# Patient Record
Sex: Male | Born: 1950 | Race: Black or African American | Hispanic: No | Marital: Single | Smoking: Former smoker
Health system: Southern US, Community
[De-identification: ages and names within clinical notes are randomized; demographics above are authoritative.]

## PROBLEM LIST (undated history)

## (undated) DIAGNOSIS — E785 Hyperlipidemia, unspecified: Secondary | ICD-10-CM

## (undated) DIAGNOSIS — K219 Gastro-esophageal reflux disease without esophagitis: Secondary | ICD-10-CM

## (undated) DIAGNOSIS — K859 Acute pancreatitis without necrosis or infection, unspecified: Secondary | ICD-10-CM

## (undated) DIAGNOSIS — I639 Cerebral infarction, unspecified: Secondary | ICD-10-CM

## (undated) DIAGNOSIS — N183 Chronic kidney disease, stage 3 unspecified: Secondary | ICD-10-CM

## (undated) DIAGNOSIS — K861 Other chronic pancreatitis: Secondary | ICD-10-CM

## (undated) DIAGNOSIS — K635 Polyp of colon: Secondary | ICD-10-CM

## (undated) DIAGNOSIS — C189 Malignant neoplasm of colon, unspecified: Secondary | ICD-10-CM

## (undated) DIAGNOSIS — E039 Hypothyroidism, unspecified: Secondary | ICD-10-CM

## (undated) DIAGNOSIS — E559 Vitamin D deficiency, unspecified: Secondary | ICD-10-CM

## (undated) DIAGNOSIS — I81 Portal vein thrombosis: Secondary | ICD-10-CM

## (undated) DIAGNOSIS — R7303 Prediabetes: Secondary | ICD-10-CM

## (undated) DIAGNOSIS — I10 Essential (primary) hypertension: Secondary | ICD-10-CM

## (undated) HISTORY — DX: Vitamin D deficiency, unspecified: E55.9

## (undated) HISTORY — DX: Gastro-esophageal reflux disease without esophagitis: K21.9

## (undated) HISTORY — DX: Hypothyroidism, unspecified: E03.9

## (undated) HISTORY — DX: Other chronic pancreatitis: K86.1

## (undated) HISTORY — PX: NECK SURGERY: SHX720

## (undated) HISTORY — PX: THYROID SURGERY: SHX805

## (undated) HISTORY — PX: THYROIDECTOMY: SHX17

## (undated) HISTORY — DX: Portal vein thrombosis: I81

## (undated) HISTORY — DX: Chronic kidney disease, stage 3 unspecified: N18.30

## (undated) HISTORY — PX: CHOLECYSTECTOMY: SHX55

## (undated) HISTORY — DX: Prediabetes: R73.03

---

## 1998-09-29 ENCOUNTER — Observation Stay (HOSPITAL_COMMUNITY): Admission: EM | Admit: 1998-09-29 | Discharge: 1998-10-01 | Payer: Self-pay | Admitting: *Deleted

## 1998-09-29 ENCOUNTER — Encounter: Payer: Self-pay | Admitting: *Deleted

## 1998-09-29 ENCOUNTER — Encounter: Payer: Self-pay | Admitting: General Surgery

## 1999-10-28 ENCOUNTER — Encounter: Payer: Self-pay | Admitting: Neurosurgery

## 1999-10-28 ENCOUNTER — Observation Stay (HOSPITAL_COMMUNITY): Admission: RE | Admit: 1999-10-28 | Discharge: 1999-10-29 | Payer: Self-pay | Admitting: Neurosurgery

## 1999-11-20 ENCOUNTER — Encounter: Payer: Self-pay | Admitting: Neurosurgery

## 1999-11-20 ENCOUNTER — Ambulatory Visit (HOSPITAL_COMMUNITY): Admission: RE | Admit: 1999-11-20 | Discharge: 1999-11-21 | Payer: Self-pay | Admitting: Neurosurgery

## 2000-01-29 ENCOUNTER — Encounter: Payer: Self-pay | Admitting: Neurosurgery

## 2000-01-29 ENCOUNTER — Ambulatory Visit (HOSPITAL_COMMUNITY): Admission: RE | Admit: 2000-01-29 | Discharge: 2000-01-29 | Payer: Self-pay | Admitting: Neurosurgery

## 2002-05-03 ENCOUNTER — Encounter: Payer: Self-pay | Admitting: *Deleted

## 2002-05-03 ENCOUNTER — Emergency Department (HOSPITAL_COMMUNITY): Admission: EM | Admit: 2002-05-03 | Discharge: 2002-05-03 | Payer: Self-pay | Admitting: *Deleted

## 2003-01-14 ENCOUNTER — Emergency Department (HOSPITAL_COMMUNITY): Admission: EM | Admit: 2003-01-14 | Discharge: 2003-01-14 | Payer: Self-pay | Admitting: *Deleted

## 2003-01-14 ENCOUNTER — Encounter: Payer: Self-pay | Admitting: Emergency Medicine

## 2003-01-16 ENCOUNTER — Encounter: Payer: Self-pay | Admitting: Emergency Medicine

## 2003-01-16 ENCOUNTER — Emergency Department (HOSPITAL_COMMUNITY): Admission: EM | Admit: 2003-01-16 | Discharge: 2003-01-16 | Payer: Self-pay | Admitting: Emergency Medicine

## 2003-01-20 ENCOUNTER — Inpatient Hospital Stay (HOSPITAL_COMMUNITY): Admission: EM | Admit: 2003-01-20 | Discharge: 2003-01-23 | Payer: Self-pay | Admitting: Emergency Medicine

## 2003-05-10 ENCOUNTER — Emergency Department (HOSPITAL_COMMUNITY): Admission: EM | Admit: 2003-05-10 | Discharge: 2003-05-11 | Payer: Self-pay | Admitting: Emergency Medicine

## 2003-10-16 ENCOUNTER — Encounter (HOSPITAL_COMMUNITY): Admission: RE | Admit: 2003-10-16 | Discharge: 2003-11-15 | Payer: Self-pay | Admitting: Orthopedic Surgery

## 2003-12-28 ENCOUNTER — Inpatient Hospital Stay (HOSPITAL_COMMUNITY): Admission: EM | Admit: 2003-12-28 | Discharge: 2003-12-31 | Payer: Self-pay | Admitting: Emergency Medicine

## 2004-01-04 ENCOUNTER — Ambulatory Visit (HOSPITAL_COMMUNITY): Admission: RE | Admit: 2004-01-04 | Discharge: 2004-01-04 | Payer: Self-pay | Admitting: Internal Medicine

## 2004-01-17 ENCOUNTER — Encounter (HOSPITAL_COMMUNITY): Admission: RE | Admit: 2004-01-17 | Discharge: 2004-01-18 | Payer: Self-pay | Admitting: Cardiology

## 2004-07-18 ENCOUNTER — Emergency Department (HOSPITAL_COMMUNITY): Admission: EM | Admit: 2004-07-18 | Discharge: 2004-07-19 | Payer: Self-pay | Admitting: Emergency Medicine

## 2004-07-20 ENCOUNTER — Inpatient Hospital Stay (HOSPITAL_COMMUNITY): Admission: EM | Admit: 2004-07-20 | Discharge: 2004-07-28 | Payer: Self-pay | Admitting: Emergency Medicine

## 2004-07-21 ENCOUNTER — Ambulatory Visit: Payer: Self-pay | Admitting: Internal Medicine

## 2004-08-15 ENCOUNTER — Emergency Department (HOSPITAL_COMMUNITY): Admission: EM | Admit: 2004-08-15 | Discharge: 2004-08-15 | Payer: Self-pay | Admitting: Emergency Medicine

## 2004-09-30 ENCOUNTER — Emergency Department (HOSPITAL_COMMUNITY): Admission: EM | Admit: 2004-09-30 | Discharge: 2004-09-30 | Payer: Self-pay | Admitting: Emergency Medicine

## 2004-10-10 ENCOUNTER — Inpatient Hospital Stay (HOSPITAL_COMMUNITY): Admission: EM | Admit: 2004-10-10 | Discharge: 2004-10-13 | Payer: Self-pay | Admitting: Emergency Medicine

## 2004-10-13 ENCOUNTER — Ambulatory Visit: Payer: Self-pay | Admitting: Urgent Care

## 2004-10-27 ENCOUNTER — Ambulatory Visit: Payer: Self-pay | Admitting: Internal Medicine

## 2004-10-30 ENCOUNTER — Ambulatory Visit (HOSPITAL_COMMUNITY): Admission: RE | Admit: 2004-10-30 | Discharge: 2004-10-30 | Payer: Self-pay | Admitting: Internal Medicine

## 2004-11-19 ENCOUNTER — Ambulatory Visit: Payer: Self-pay | Admitting: Internal Medicine

## 2005-02-06 ENCOUNTER — Ambulatory Visit: Payer: Self-pay | Admitting: Nurse Practitioner

## 2005-02-13 ENCOUNTER — Ambulatory Visit: Payer: Self-pay | Admitting: Nurse Practitioner

## 2005-02-17 ENCOUNTER — Ambulatory Visit: Payer: Self-pay | Admitting: *Deleted

## 2005-05-12 ENCOUNTER — Emergency Department (HOSPITAL_COMMUNITY): Admission: EM | Admit: 2005-05-12 | Discharge: 2005-05-12 | Payer: Self-pay | Admitting: *Deleted

## 2005-07-01 ENCOUNTER — Ambulatory Visit (HOSPITAL_COMMUNITY): Admission: RE | Admit: 2005-07-01 | Discharge: 2005-07-01 | Payer: Self-pay | Admitting: General Surgery

## 2005-07-12 ENCOUNTER — Emergency Department (HOSPITAL_COMMUNITY): Admission: EM | Admit: 2005-07-12 | Discharge: 2005-07-12 | Payer: Self-pay | Admitting: Emergency Medicine

## 2005-07-31 ENCOUNTER — Inpatient Hospital Stay (HOSPITAL_COMMUNITY): Admission: RE | Admit: 2005-07-31 | Discharge: 2005-08-02 | Payer: Self-pay | Admitting: General Surgery

## 2005-07-31 ENCOUNTER — Encounter (INDEPENDENT_AMBULATORY_CARE_PROVIDER_SITE_OTHER): Payer: Self-pay | Admitting: General Surgery

## 2005-11-13 ENCOUNTER — Encounter (INDEPENDENT_AMBULATORY_CARE_PROVIDER_SITE_OTHER): Payer: Self-pay | Admitting: Specialist

## 2005-11-13 ENCOUNTER — Ambulatory Visit: Payer: Self-pay | Admitting: Internal Medicine

## 2005-11-13 ENCOUNTER — Ambulatory Visit (HOSPITAL_COMMUNITY): Admission: RE | Admit: 2005-11-13 | Discharge: 2005-11-13 | Payer: Self-pay | Admitting: Internal Medicine

## 2006-06-01 ENCOUNTER — Emergency Department (HOSPITAL_COMMUNITY): Admission: EM | Admit: 2006-06-01 | Discharge: 2006-06-01 | Payer: Self-pay | Admitting: Emergency Medicine

## 2006-07-02 ENCOUNTER — Emergency Department (HOSPITAL_COMMUNITY): Admission: EM | Admit: 2006-07-02 | Discharge: 2006-07-02 | Payer: Self-pay | Admitting: Emergency Medicine

## 2007-09-28 ENCOUNTER — Inpatient Hospital Stay (HOSPITAL_COMMUNITY): Admission: EM | Admit: 2007-09-28 | Discharge: 2007-10-06 | Payer: Self-pay | Admitting: Emergency Medicine

## 2007-10-05 ENCOUNTER — Ambulatory Visit: Payer: Self-pay | Admitting: Gastroenterology

## 2007-12-19 ENCOUNTER — Emergency Department (HOSPITAL_COMMUNITY): Admission: EM | Admit: 2007-12-19 | Discharge: 2007-12-19 | Payer: Self-pay | Admitting: Emergency Medicine

## 2008-01-04 ENCOUNTER — Emergency Department (HOSPITAL_COMMUNITY): Admission: EM | Admit: 2008-01-04 | Discharge: 2008-01-04 | Payer: Self-pay | Admitting: Emergency Medicine

## 2009-01-24 ENCOUNTER — Emergency Department (HOSPITAL_COMMUNITY): Admission: EM | Admit: 2009-01-24 | Discharge: 2009-01-24 | Payer: Self-pay | Admitting: Emergency Medicine

## 2010-06-28 ENCOUNTER — Encounter: Payer: Self-pay | Admitting: General Surgery

## 2010-09-13 LAB — POCT CARDIAC MARKERS
CKMB, poc: 1.4 ng/mL (ref 1.0–8.0)
Myoglobin, poc: 107 ng/mL (ref 12–200)
Troponin i, poc: <0.05 ng/mL (ref 0.00–0.09)
Troponin i, poc: <0.05 ng/mL (ref 0.00–0.09)

## 2010-09-13 LAB — DIFFERENTIAL
Basophils Relative: 1 % (ref 0–1)
Monocytes Absolute: 0.4 10*3/uL (ref 0.1–1.0)
Monocytes Relative: 8 % (ref 3–12)
Neutro Abs: 3.4 10*3/uL (ref 1.7–7.7)

## 2010-09-13 LAB — BASIC METABOLIC PANEL
CO2: 28 mEq/L (ref 19–32)
Calcium: 9.4 mg/dL (ref 8.4–10.5)
Chloride: 105 mEq/L (ref 96–112)
GFR calc Af Amer: >60 mL/min (ref >60)
Sodium: 140 mEq/L (ref 135–145)

## 2010-09-13 LAB — CBC
Hemoglobin: 14.1 g/dL (ref 13.0–17.0)
MCHC: 32.3 g/dL (ref 30.0–36.0)
RBC: 5.84 MIL/uL — ABNORMAL HIGH (ref 4.22–5.81)

## 2010-10-21 NOTE — H&P (Signed)
NAMEGEOFFERY, [name] NO.:  [id number]   MEDICAL RECORD NO.:  [id number]          PATIENT TYPE:  EMS   LOCATION:  ED                           FACILITY:  [facility]   PHYSICIAN:  Vinnie Level, MD    DATE OF BIRTH:  01/02/1951   DATE OF ADMISSION:  09/28/2007  DATE OF DISCHARGE:                              HISTORY & PHYSICAL   CHIEF COMPLAINT:  Abdominal pain.   PRIMARY CARE PHYSICIAN:  Dr. Hanley Ben at the [facility].   HISTORY OF PRESENT ILLNESS:  The patient is a 60 year old African-  American male with a history of hypertension, chronic pancreatitis, and  history of colon polyp that he reports as colon cancer who presents with  the chief complaint of abdominal pain.  He notes this pain started  Monday, approximately 7-9 out of 10 intensity, and has been increasing  in intensity to today.  He notes nausea and diarrhea associated with  this, all started Monday.  He has had some vague associated chills but  no frank fevers.  He said pain was 9 out of 10 this afternoon which  prompted him to come to the emergency department.  He reports black  stools, no frank hematochezia or blood.  He has never had any history of  hematemesis.  He does report feeling weak at time.  He notes he has been  unable to eat today, being limited by pain and nausea.  He denies any  alcohol use at present.  He notes he quit drinking approximately 12  years ago.  He has noted no recent swelling in his belly.  In the  emergency department, he had been given Reglan, Dilaudid, and Zofran  with mild relief of symptoms.  He reports he did take his medicines this  morning.   PAST MEDICAL HISTORY:  1. Colon polyp/colon cancer in 2005.  2. Hypertension.  3. Hypothyroidism.  4. Chronic pancreatitis.  5. TIA, question CVA, back in 2005 as well.   PAST SURGICAL HISTORY:  None.   MEDICATIONS:  Medications at home, all prescribed through the [place],  include:  1. Levothyroxine 0.1 mg  daily.  2. Docusate 100 mg p.o. daily.  3. Ferrous gluconate 324 mg p.o. daily.  4. Nitroglycerin p.r.n. chest pain, not been taken recently.  5. Omeprazole 20 mg p.o. daily.  6. Simvastatin 40 mg tablets one-half tablet daily (20 mg).  7. Sulindac 150 mg p.r.n. pain b.i.d.  8. Hydrochlorothiazide/lisinopril 12.5/20 p.o. daily.   ALLERGIES:  NO KNOWN DRUG ALLERGIES.  HE IS INTOLERANT TO ASPIRIN WHICH  CAUSES STOMACH UPSET.   FAMILY HISTORY:  Family history notable for hypertension and colon  cancer.  He had a brother with multiple deep venous thromboses.  Mom  with DVT and died of old age.  Dad died of head trauma.   SOCIAL HISTORY:  He denies tobacco, alcohol, or drug use, though he does  report a significant alcohol history, last drink was 12 years ago.  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   REVIEW OF SYSTEMS:  All systems were reviewed  and were negative except  as noted above in the HPI.  GENERAL/HEENT:  Generally, no recent  problems with his head or neck, no trouble swallowing, no dysphagia, no  changes in his speech, normal mental status.  NECK:  No recent  lymphadenopathy.  RESPIRATORY:  No recent changes in his breathing, no  coughing, no hemoptysis.  CARDIAC:  No chest pain.  No recent swelling  of his lower extremities.  No recent dyspnea on exertion.  ABDOMEN:  He  does not note any increased abdominal swelling.  Noted tenderness as  dictated above.  Notable nausea and diarrhea.  No bloody bowel  movements.  SKIN:  Without rash, lumps, or bumps.  MUSCULOSKELETAL:  Without frank joint pain, normal gait, no arthralgias.  GENITOURINARY:  Normal urinary output.  No recent hematuria or urinary hesitancy.   Otherwise, all systems reviewed are negative except as noted above in  the HPI.  Please see written note.   OBJECTIVE:  VITAL SIGNS:  Temperature is 98.8, blood pressure is 137-  147/86-97, pulse 93-112, respiratory rate 18-22, saturation 99-100% on  room air.   GENERAL:  He is in no acute distress, alert and oriented x4.  HEENT:  Normocephalic, atraumatic.  Wearing corrective lenses.  Oropharynx is clear.  Mucous membranes appear moist, no exudate.  Nares  patent externally.  NECK:  No frank lymphadenopathy, no noted JVD, no bruits.  CHEST:  Clear to auscultation bilaterally.  CARDIOVASCULAR:  Regular rate and rhythm.  No frank murmurs, rubs, or  gallops.  ABDOMEN:  Tender, worse in the right lower quadrant.  Normoactive bowel  sounds.  Palpable liver edge without frank guarding.  No palpable  splenomegaly.  No noted periumbilical varices.  SKIN:  No jaundice.  EXTREMITIES:  Positive sock line bilaterally.  No clubbing or cyanosis.  No noted asterixis either.  NEUROLOGIC:  Cranial nerves II-XII are intact.  MUSCULOSKELETAL:  Normal x4 extremities.   LABORATORY DATA:  Laboratory data reveals a white count of 6.3,  hemoglobin of 13.0, platelet count of 270,000.  His sodium is 141,  potassium 3.6, chloride 106, bicarbonate 28, BUN 11, creatinine 1.3,  glucose 97, total protein 10.5, albumin 3.7, AST 20, ALT 23, lipase 57.  I cannot find an alkaline phosphatase noted although I am certain this  was checked.  CT of the abdomen does reveal a right hepatic segment  portal vein thrombosis.  Differential diagnosis, per the radiologist,  includes hypercoagulable state, cirrhosis, bowel ischemia, IDD,  neoplasm.   ASSESSMENT/PLAN:  A [name] with hypertension,  hypothyroidism, and chronic pancreatitis presents with abdominal pain,  nausea, weakness, and radiologic findings consistent for portal vein  thrombus.  1. Abdominal pain:  He does apparently have a portal vein thrombus by      CT, query acute versus recent.  Given his history of abdominal      pain, I would argue for more of an acute process.  He does have      this consistent history, however, of chronic pancreatitis, but      lipase appears unremarkable, as above.   He notes the pain is      similar to this and this should remain in the differential.  Will      make him n.p.o. for this and treat with symptomatic pain      medications and antiemetics as needed.  I do suspect an underlying      diagnosis of cirrhosis based on his prior alcoholism and  palpable      liver.  Further chart review when the computer appears to be      working better of his transaminases over time would be notable.  I      do not think other differential items for causes of his      pancreatitis are worthy of working up at the present time.  With      regard to his portal vein thrombus, I do note he has a profound      family history for hypercoagulable disorder and this probably      deserves further workup.  He will likely need an outpatient EGD to      evaluate for varices as well.  I do not feel a palpable spleen tip,      but further review of his CT scan would be important.  He may      warrant an ultrasound in the morning if he is feeling better for      evaluation of portal flow by ultrasound Duplex.  I will check a CMP      in the morning.  2. Hypertension:  Continue with hydrochlorothiazide/ACE inhibitor.  3. Fluids, electrolytes, and nutrition:  N.p.o. as above with      occasional chips if he is feeling better towards the morning.      Electrolytes within normal limits.  Will continue with IV fluids at      75 mL an hour.  Record his I&O.  4. Hyperlipidemia:  Will continue with his Statin though evaluation of      further LFT may be of note.  5. Endocrine:  Will check a TSH and continue with his levothyroxine.  6. Hematologic:  His hemoglobin does appear slightly on the low side.      Will continue with his ferrous gluconate.  7. Gastrointestinal:  Will continue with his omeprazole for      prophylaxis as well as Lovenox.  8. Renal:  Will repeat a creatinine in the morning.  I am unclear if      his value of 1.33 is actually valid; if so, he may warrant  further      workup.      Vinnie Level, MD  Electronically Signed     PMB/MEDQ  D:  09/28/2007  T:  09/28/2007  Job:  [id number]

## 2010-10-21 NOTE — Discharge Summary (Signed)
NAMEBEHR, CISLO               ACCOUNT NO.:  [id number]   MEDICAL RECORD NO.:  [id number]          PATIENT TYPE:  INP   LOCATION:  1418                         [facility]ACILITY:  [facility]   PHYSICIAN:  Ladell Pier, M.D.   DATE OF BIRTH:  [birth year]   DATE OF ADMISSION:  09/28/2007  DATE OF DISCHARGE:                               DISCHARGE SUMMARY   ADDENDUM   1. As far as his chronic pancreatitis that was stable from the 29th      until present.  2. Abnormal liver function tests.  His liver function test was noted      to be abnormal the day prior to discharge. He had an acute      hepatitis panel that was normal.  They are trending down.  His      statin was stopped.  He will followup with his primary care for      further evaluation of his elevated LFTs and to monitor his      function.  3. Portal vein thrombosis:  He is on Coumadin which he will be on for      approximately 4 months.  His INR is therapeutic today.  He will be      discharged on 7.5 mg of Coumadin and he will follow up for recheck      of his INR tomorrow and then routinely with his primary care      physician.   LABS AT THE TIME OF DISCHARGE:  Bilirubin 0.1, sodium 140, potassium  4.5, chloride 107, CO2 26, glucose 92, BUN 12, creatinine 1.24, total  bili 0.3 alk phos 44, AST 81, ALT 131, total protein 6.7, albumin 3.3,  calcium 9.0. PT 25.1, INR 2.2. WBC 5.9, hemoglobin 11.9, platelets 325.  Acute hepatitis panel negative. Antithrombin 3 level 103, protein C  level is 76. Functional protein C level is 115. DRVVT level is 55.  Homocysteine level is 12.3. Cardiolipin antibody IgG less than 7, IgM  less than 7, IgA less than 7, negative for Factor V mutation. Protein S  103, total and functional 64, protein C 55, total and functional 36.  Ultrasound shows intrahepatic segmental occlusion involving anterior  right portal vein of uncertain etiology.      Ladell Pier, M.D.  Electronically Signed     NJ/MEDQ  D:  10/06/2007  T:  10/06/2007  Job:  [id number]

## 2010-10-21 NOTE — Discharge Summary (Signed)
NAMEMAKAIL, WATLING NO.:  [id number]   MEDICAL RECORD NO.:  [id number]          PATIENT TYPE:  INP   LOCATION:  1418                         [facility]ACILITY[facility]  [facility]   PHYSICIAN:  Eduard Clos, MDDATE OF BIRTH:  02-04-1951   DATE OF ADMISSION:  09/28/2007  DATE OF DISCHARGE:  10/06/2007                               DISCHARGE SUMMARY   This is an interim discharge summary dictated on October 06, 2007, for  course until October 04, 2007.  This is a 60 year old male with known  history of chronic pancreatitis, hypothyroidism, hypertension, presented  here with complaints of abdominal pain.  The patient had a CAT scan of  abdomen and pelvis done which did not show any features of acute  pancreatitis, but did show portal vein thrombosis.  The patient admitted  to the medical floor.  The patient's lipase was within normal limits.  The patient was initially placed on NPO.  The patient had a  gastroenterology consult done and as per gastroenterology the patient  underwent ultrasound of the abdomen which showed portal vein thrombosis  which was confirmed and this patient was started on anticoagulation full  dose Lovenox with Coumadin.  As the patient's abdominal pain has  subsided and has had good appetite, the patient was started on clear  liquids which was advanced.  At this time the patient's coagulation  workup was still pending and the patient's LFTs started increasing for  which Simvastatin was stopped and LFTs are to be followed.  Acute  hepatitis panel was also ordered.  As per gastroenterologist, the  patient is to be at least for four months on Coumadin and following  which a repeat sonogram of the abdomen is to be done to evaluate for his  portal vein thrombosis.  His portal vein thrombosis was most likely  secondary to his chronic pancreatitis.   DIAGNOSIS:  1. 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  2. Hypothyroidism.  3. Hypertension.  4. Abnormal liver function tests.   PLAN:  The patient will need a repeat ultrasound in four months once his  Coumadin has been stopped.  The patient is to be on Coumadin with  frequent checks of his INR with his primary care physician for his  portal vein thrombosis and his coagulation workup is still pending which  is to be followed.   DISCHARGE MEDICATIONS:  Decided at the time of discharge.      Eduard Clos, MD  Electronically Signed     ANK/MEDQ  D:  10/06/2007  T:  10/06/2007  Job:  [id number]

## 2010-10-24 NOTE — Op Note (Signed)
NAME:  [name], [name]               ACCOUNT NO.:  [id number]   MEDICAL RECORD NO.:  [id number]          PATIENT TYPE:  AMB   LOCATION:  DAY                           FACILITY:  [facility]   PHYSICIAN:  R. Roetta Sessions, M.D. DATE OF BIRTH:  [birth year]   DATE OF PROCEDURE:  11/13/2005  DATE OF DISCHARGE:                                 OPERATIVE REPORT   PROCEDURE:  Colonoscopy with biopsy.   INDICATIONS FOR PROCEDURE:  Patient is a 60 year old gentleman who underwent  colonoscopy last year with removal of large colonic adenoma.  He is not  having any large GI symptoms currently.  He is here for surveillance.  This  approach has been discussed with the patient at length.  Potential risks,  benefits and alternatives have been reviewed and questions answered.  He is  agreeable.  Please see documentation on the medical record.   PROCEDURE NOTE:  O2 saturation, blood pressure, pulses, and respirations  were monitored throughout the entire procedure.  Conscious sedation with  Versed 4 mg IV, Demerol 75 mg IV in divided doses.   INSTRUMENT:  Olympus video chip system.   FINDINGS:  Digital rectal exam revealed no abnormalities.   ENDOSCOPIC FINDINGS:  Prep was good.   RECTAL:  Examination of the rectal mucosa, including retroflexion of the  anal verge, revealed no abnormalities.   COLON:  The colonic mucosa was surveyed from the rectosigmoid junction  through the left transverse, right colon, the appendiceal orifice, the  ileocecal valve, and cecum.  These structures were well seen and  photographed for the record.  From this level, the scope was slowly  withdrawn.  All previously mentioned mucosal surfaces were again seen.  The  patient had a diminutive 4 mm polyp at the base of the cecum.  The remainder  of the colonic mucosa appeared normal.  The polyp was cold-biopsied/removed.  The patient tolerated the procedure well and was reactive.   ENDOSCOPY IMPRESSION:  1.  Normal rectum.  2.   Diminutive cecal polyp, otherwise normal colon, status post cold      biopsy/removal of polyp, as mentioned above.   RECOMMENDATIONS:  1.  Follow up on path.  2.  Further recommendations to follow.      [name], M.D.  Electronically Signed     RMR/MEDQ  D:  11/13/2005  T:  11/13/2005  Job:  [id number]   cc:   Dr. Katrinka Blazing

## 2010-10-24 NOTE — Group Therapy Note (Signed)
NAME:  [name], BOLLARD               ACCOUNT NO.:  [id number]   MEDICAL RECORD NO.:  [id number]          PATIENT TYPE:  INP   LOCATION:  A335                          FACILITY:  [facility]   PHYSICIAN:  Margaretmary Dys, M.D.DATE OF BIRTH:  1950/11/14   DATE OF PROCEDURE:  07/27/2004  DATE OF DISCHARGE:                                   PROGRESS NOTE   SUBJECTIVE:  The patient has no complaints.  Doing well.  Awaiting his  biopsy report.  He denies any nausea, vomiting or abdominal pain.  He does  say he has occasional diarrhea.  No fever, chills or rigors.  No headache,  dizziness or lightheadedness.  No chest pain or shortness of breath.   OBJECTIVE:  GENERAL APPEARANCE:  Conscious, alert and comfortable, not in  acute distress.  VITAL SIGNS:  Blood pressure 109/65, pulse 80, respiratory rate 20,  temperature 97.8.  HEENT:  Normocephalic, atraumatic.  Oral mucosa is moist with no exudates.  NECK:  Supple.  No JVD.  No lymphadenopathy.  LUNGS:  Clear clinically with good air entry bilaterally.  HEART:  S1, S2 regular.  No S3, S4, gallops or rubs.  ABDOMEN:  Soft and nontender.  Bowel sounds positive.  No mass palpable.  EXTREMITIES:  No pitting pedal edema.  No calf induration or tenderness was  noted.   LABORATORY DATA:  White blood cell count 4.2, hemoglobin 11.2, hematocrit  32.3, platelet count 276 with no significant left shift.  It is noted that  the patient does have eosinophils 10% in his differential.  Sodium is 132,  potassium 3.8, chloride 103, CO2 25, glucose 109, BUN 2, creatinine 1.3,  calcium 8.1.   ASSESSMENT/PLAN:  The patient was admitted with acute abdominal pain and was  found to have a mass on CT of the abdomen.  The patient is going to be  worked up for the mass.  Had a colonoscopy with polypectomy on July 25, 2004.  Results of his biopsies should be available by the middle of this  week.  We will continue current plan with hydration and pain control for  now.      AM/MEDQ  D:  07/27/2004  T:  07/27/2004  Job:  [id number]

## 2010-10-24 NOTE — Discharge Summary (Signed)
NAMELAVIN, PETTEWAY               ACCOUNT NO.:  [id number]   MEDICAL RECORD NO.:  [id number]          PATIENT TYPE:  INP   LOCATION:  A335                          FACILITY:  [facility]   PHYSICIAN:  Osvaldo Shipper, MD     DATE OF BIRTH:  12-Oct-1950   DATE OF ADMISSION:  07/20/2004  DATE OF DISCHARGE:  02/20/2006LH                                 DISCHARGE SUMMARY   PRIMARY CARE PHYSICIAN:  Dr. Elpidio Anis.   DISCHARGE DIAGNOSES:  1.  Carcinoma in situ of the colon.  2.  Hypothyroidism.  3.  Pedunculated polyps of the left colon.   HISTORY AND PHYSICAL:  Please review the history and physical dictated on  July 20, 2004 for details regarding this patient's presenting illness.   HOSPITAL COURSE:  The patient is a [name] who has  had multiple admissions in the past for probable pancreatitis.  He presented  to the hospital this time with nausea, vomiting and diffuse abdominal pain  with diarrhea and some crampy abdominal pain.  The patient was admitted with  a diagnosis of acute viral gastroenteritis.  The diagnosis of pancreatitis  was in doubt.  Since his symptoms were not resolving, the patient underwent  CAT scan of his abdomen which showed an ileocolic intussusception in the  ascending colon with what appeared to be a large soft tissue mass which was  leading the intussusception, measuring about 3.6 x 3.5 cm.  Subsequent to  this CAT scan, gastroenterology service was consulted, and the patient  underwent a colonoscopy.  Initial attempt of colonoscopy revealed  pedunculated, villous-appearing mass in the mid-right colon.  A 6 mm  pedunculated polyp was also seen at the ileocecal valve.  These masses were  removed.  However, the patient developed a vagal reaction and the procedure  had to be terminated prematurely.  The patient underwent a repeat  colonoscopy the following day in which there were some pedunculated polyps  in the left colon which were  removed with a snare, and the additional  residual polypoid mass in the right colon was also removed by snare  polypectomy.  All the masses and tissue was submitted to pathology.  Dr.  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  There was no evidence for any invasive  cancer seen.  Based on this report, it was thought that the patient should  have a repeat colonoscopy in about 3 months.  We will draw a CEA level on  this patient prior to his discharge.  The patient needs not use any NSAIDS  or aspirin for about 10 days from today.  The final pathology report is not  available yet, and the above mentioned is based on Dr. Luvenia Starch conversation  with the pathologist.   PROCEDURES PERFORMED:  Colonoscopy as described above.   CONDITION ON DISCHARGE:  Condition of the patient on discharge is stable.   ACTIVITY:  May resume previous level of activity.   DIET:  The patient may  eat a low-salt diet.   DISCHARGE MEDICATIONS:  1.  Reglan 10 mg p.o. every 6 hours as needed for residual nausea that the      patient still has.  2.  Lortab 7.5/500, 1 tablet every 6 hours as needed for residual pain that      the patient has.   FOLLOWUP:  The patient needs to see his primary doctor, Dr. Elpidio Anis, in  a week to 10 days from today.  He needs to see Dr. Jena Gauss in his office, in 2-  3 months at which point an additional repeat colonoscopy will be performed.  The patient will be given the telephone numbers for these 2 physicians, and  he will be calling him to confirm these appointments.      GK/MEDQ  D:  07/28/2004  T:  07/28/2004  Job:  [id number]   cc:   Dirk Dress. Katrinka Blazing, M.D.  [address]  [place]  [place] [address]  Fax: [phone]   R. Roetta Sessions, M.D.  [address]  [place]  [place] [address]

## 2010-10-24 NOTE — Consult Note (Signed)
NAME:  Guy, Farrell                         ACCOUNT NO.:  [id number]   MEDICAL RECORD NO.:  [id number]                   PATIENT TYPE:  INP   LOCATION:  A217                                 FACILITY:  [facility]   PHYSICIAN:  Kofi A. Gerilyn Pilgrim, M.D.              DATE OF BIRTH:  [birth year]   DATE OF CONSULTATION:  DATE OF DISCHARGE:                                   CONSULTATION   REASON FOR CONSULTATION:  Left-sided weakness.   IMPRESSION:  The symptoms and the clinical picture seem most consistent with  psychosomatic/conversion disorder.  The patient does appear to have a  thyroid mass which may also contribute to the overall medical illness and  fatigue.  However, the thyroid disease if present is unlikely to explain  focal neurological deficit, unless of course he has a thyroid myopathy.  The  recommendations, no further neurological recommendation is suggested at this  time.  The thyroid function tests are to be followed up, and appropriate  treatment rendered.  Suspect the patient's neurological deficits should  improve within a couple of weeks or so.  If the patient's condition does not  improve, a reconsultation is suggested.  Depending on the patient's clinical  progression, further testing may include acetylcholine receptor antibody  testing to evaluate for the possibility of myasthenia gravis, although this  seems unlikely with the sensory symptoms and preserved proximal muscle  strength, particularly the preserved  orbicularis oculi muscles.  Additionally, cervical spine MRI may also be considered, although again his  examination suggested a cervical myelopathy is unlikely given the  description of impaired sensation on the left, and also weakness on the left  side.  Additionally, he also reports facial numbness on the left.   HISTORY:  A 60 year old man who apparently presented with acute onset of  left-sided numbness and weakness.  He apparently went to bed and woke up  with these symptoms.  He also complains of neck ache and headache associated  with these symptoms.  He was taken the emergency room for further evaluation  where images of the brain does not show any acute process.  The patient's  symptoms have persisted.  He still reports weakness and numbness on the left  side.  The patient does not report having a history of episodic headaches.   The patient denies any smothering problems.  He does report having some  slurred speech associated with his current symptoms.  He also reports  episodic diplopia on standing, apparently associated with dizziness.   SOCIAL HISTORY:  The patient psychosocial history is significant for recent  divorce approximately seven months ago.  He also lost his job approximately  one month ago.   PAST MEDICAL HISTORY:  Significant for pancreatitis thought related to a  remote history of alcohol use.  Apparently, he has not consumed alcoholic  beverages since approximately 12 years ago.  There is also past use of  tobacco and marijuana use.  The patient is status post cholecystectomy.  He  apparently has had problems with hypertension in the past.  He is status  post neck surgery four years ago.   ADMISSION MEDICATIONS:  1. Tylenol.  2. Advil.   ALLERGIES:  ASPIRIN causes GI intolerance.   FAMILY HISTORY:  Significant for hypertension and cirrhosis of the liver.   REVIEW OF SYSTEMS:  Positive for chest pain apparently on the left side  associated with palpitations and sweaty.  No GI symptoms are reported.   PHYSICAL EXAMINATION:  GENERAL:  A thin gentleman.  The patient has a flat  affect.  VITAL SIGNS:  Temperature 97.5, pulse 76, respirations 20, blood pressure  144/87.  NECK:  Supple.  ABDOMEN: Soft.  EXTREMITIES:  No edema.  NEUROLOGIC:  Mentation again showed he is awake. He converses well.  His  affect is flat.  No dysarthria or dysphagia is noted.  The cranial nerve  evaluation shows that the pupils are 4  mm and briskly active.  He does  appear to have some evidence of mild bilateral ptosis although direct  examination of the orbicularis oculi muscles are intact and shows normal.  Extraocular movements are full.  Visual fields are intact.  Facial muscle  strength is normal and symmetric.  Tongue is midline.  Uvula is midline.  He  does seem to have a significant overbite.  Shoulder shrug is normal.  Motor  examination shows generalized weakness 4/5 on both sides.  The weakness  seemed to be typical give-away weakness however.  The patient was tested for  arm abduction time.  He was able to go two minutes on the right.  On the  left side, he had initial 30 degree drop, but was able to sustain his arm up  for a full two minutes on the left side.  The patient was able to maintain  his eyes looking up for a full two minutes without ptosis on both sides.  Reflexes are slightly brisk; however, plantar reflexes are both downgoing.  Sensory examination was normal on both sides to light touch and temperature.  Coordination was unremarkable.  Gait slightly unsteady but seems fine.   LABORATORY DATA:  Laboratory evaluation is essentially unremarkable  including electrolytes.  Specifically, calcium, potassium and sodium were  normal.  Magnesium was also normal.  A spinal fluid analysis was obtained.  WBC is apparently 0 on tubes 1 and tube 4.  On tube 1, the rbc's are 2910,  and on tube 4 are 20, glucose 63, and protein 52.  CPK 119.  Sed rate of 4.  Initial CT scan of the brain is unremarkable.  MRI of the brain is also  reviewed and shows small ischemic white matter lesions.  The diffusion  imaging does not show any acute lesions.  There is apparently the  possibility of a small aneurysm located in the artery on the right  approximately 2.2 mm to 0.5 mm.      ___________________________________________                                            Darleen Crocker A. Gerilyn Pilgrim, M.D.  KAD/MEDQ  D:  12/28/2003   T:  12/29/2003  Job:  [id number]

## 2010-10-24 NOTE — Discharge Summary (Signed)
NAME:  [name], [name]                         ACCOUNT NO.:  [id number]   MEDICAL RECORD NO.:  [id number]                   PATIENT TYPE:  INP   LOCATION:  A217                                 FACILITY:  [facility]   PHYSICIAN:  Vania Rea, M.D.              DATE OF BIRTH:  01-01-1951   DATE OF ADMISSION:  12/28/2003  DATE OF DISCHARGE:  12/31/2003                                 DISCHARGE SUMMARY   PRIMARY CARE PHYSICIAN:  Annia Friendly. Loleta Chance, M.D.   NEUROLOGIST:  Kofi A. Gerilyn Pilgrim, M.D.   DISCHARGE DIAGNOSES:  1. Acute left-sided weakness, much improved.  2. 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  3. Chest pain, myocardial infarction ruled out.  4. Thyroid mass.  5. Microcytosis of unclear etiology.  6. Depression.   DISPOSITION:  Discharged to home.   CONDITION ON DISCHARGE:  Stable.   DISCHARGE MEDICATIONS:  1. Prozac 20 mg daily.  2. Plavix 75 mg daily.  3. Protonix 40 mg daily.   HISTORY OF PRESENT ILLNESS:  Please refer to the history and physical of  December 28, 2003.  This is a 60 year old African-American man who for the past  month noted episodes of sudden decrease in his exercise tolerance, but had  not sought help for this. The patient is currently not seeing his primary  care physician because of financial issues.  On the day of admission he  described a sudden weakness and numbness of the left side of his body, and  difficulty walking.  He was brought to the emergency room and evaluation  included a CT scan of his chest and an MRI and MRA of the brain.  The MRI  and MRA revealed no evidence of acute abnormalities.  A CT scan revealed a  3.0 cm thyroid mass.  The patient on admission was felt to be having  hysteria/conversion disorder, but was never-the-less admitted for a  cardiology and neurology evaluation.   HOSPITAL COURSE:  The patient had the benefit of evaluation by Dr. Gerilyn Pilgrim  who felt that there was no acute neurological problem, but recommended  reconsultation  if symptoms did not clear up within a few weeks.  The  cardiologist felt that he was likely having no acute cardiology problem, and  could probably be worked up with an outpatient stress test.  Biopsy of the  thyroid could not be performed over the weekend when the patient was in the  hospital.   FOLLOWUP:  1. The patient will be followed up by Dr. Loleta Chance, as his new primary care     Andersyn Fragoso.  2. The patient is to have a biopsy of his thyroid mass on Friday, January 04, 2004, at 11 a.m.  Registration at 10:50 a.m.  3. The patient has been given an outpatient appointment for a Cardiolite     stress test by the cardiologist.   DISCHARGE LABORATORY DATA:  The patient's hematocrit today  36, white count  4.5, MCV 71, RDW 14.2, platelet count 320.  His serum chemistries are normal  with a creatinine of 1.2.  His cardiac enzymes over 24 hours were normal.  His free T4 is 1.2, TSH 1.309, both well within the normal range.  His iron  studies also showed no evidence of iron deficiency anemia.  Serum iron 76,  ferritin 93, total iron binding capacity 337.  His B12 and folate levels  were normal.  His homocysteine levels were normal.  Of most significance his  ESR was only 4.  His RPR was nonreactive.     ___________________________________________                                         Vania Rea, M.D.   LC/MEDQ  D:  12/31/2003  T:  12/31/2003  Job:  [id number]   cc:   Annia Friendly. Loleta Chance, M.D.  [address]  [place]  [place] [address]  Fax: [phone]   Kofi A. Gerilyn Pilgrim, M.D.  [address] [place]  [place]  [place] [address]  Fax: [phone]   [facility]

## 2010-10-24 NOTE — Discharge Summary (Signed)
[name], [name] NO.:  [id number]   MEDICAL RECORD NO.:  [id number]                  PATIENT TYPE:   LOCATION:                                       FACILITY:   PHYSICIAN:  Donna Bernard, M.D.             DATE OF BIRTH:   DATE OF ADMISSION:  DATE OF DISCHARGE:  01/23/2003                                 DISCHARGE SUMMARY   FINAL DIAGNOSES:  1. Acute pancreatitis.  2. Mild anemia 12.4 hemoglobin with slight microcytosis with mean cell     volume of 72.   FINAL DISPOSITION:  1. Patient discharged to home.  2. Discharge medications:  Vicodin one every four to six hours as needed for     pain.  3. See in the office on January 30, 2003 at 10 to followup for pancreatitis     and to further discuss an approach to chronic health concerns.   INITIAL HISTORY AND PHYSICAL:  Please see H&P as dictated.   HOSPITAL COURSE:  This patient is a 60 year old black male with a history of  prior bouts of pancreatitis who presented to the emergency room, day of  admission, with complaints of severe epigastric pain.  A scan was performed  which ruled out appendicitis.  Blood work revealed an elevated amylase and  lipase.  CBC showed a borderline low hemoglobin at 12.8 with a microcytic  anemia.  The patient was given IV fluids, initially normal saline for a  liter and then D-5 1/2 normal saline plus 20 mEq K at 175 cc/hr.  Patient  was given Reglan IV for nausea, Protonix IV to cover any component of  gastritis.  He was also given morphine as needed for pain.  We had to  increase his morphine dose.  Repeat amylase and lipase showed improvement of  the lipase down to 37 and the amylase down to 131, the day following  admission.  Two days following admission, the amylase was still hovering at  borderline at 133, the lipase 43.  Due to the macrocytic anemia, we did a  ferritin level which was within normal limits at 97.  Indeed, a repeat CBC  showed a  hemoglobin of 13.8 with a microcytic anemia which was essentially  within normal limits other than the microcytosis.  I discussed this blood  panel with the patient and he preferred to hold off on further workup at  this time.  The patient improved over several days of IV fluids, nausea  medicine, pain medicine.  On the day of discharge the patient was feeling  better.  He was discharged home with the diagnoses and disposition as noted  above.          ___________________________________________  Donna Bernard, M.D.   Karie Chimera  D:  03/07/2003  T:  03/07/2003  Job:  [id number]

## 2010-10-24 NOTE — Procedures (Signed)
NAME:  [name], [name]                         ACCOUNT NO.:  [id number]   MEDICAL RECORD NO.:  [id number]                  PATIENT TYPE:   LOCATION:                                       FACILITY:   PHYSICIAN:  Newberg Bing, M.D.               DATE OF BIRTH:  04-20-1951   DATE OF PROCEDURE:  01/18/2004  DATE OF DISCHARGE:                                  ECHOCARDIOGRAM   REFERRING PHYSICIAN:  Dr. Katrinka Blazing.   CONSULTING PHYSICIAN:  Dr. Dietrich Pates.   CLINICAL DATA:  A 60 year old gentleman with chest pain.   M-MODE:  Aorta 3.4, left atrium 3.4, septum 1.3, posterior wall 1.0, LV  diastole 3.7, LV systole 3.3.   1. Technically adequate echocardiographic study.  2. Normal left atrium, right atrium, and right ventricle.  3. Normal aorta, mitral, tricuspid, and pulmonic valves.  4. Normal internal dimension of the left ventricle; borderline LVH; normal     regional and global LV systolic function.  5. Normal IVC.  6. Normal Doppler examination.      ___________________________________________                                            [name], M.D.   RR/MEDQ  D:  01/19/2004  T:  01/19/2004  Job:  [id number]

## 2010-10-24 NOTE — H&P (Signed)
NAME:  HAZE, ANTILLON               ACCOUNT NO.:  [id number]   MEDICAL RECORD NO.:  [id number]          PATIENT TYPE:  AMB   LOCATION:  DAY                           FACILITY:  [facility]   PHYSICIAN:  Jerolyn Shin C. Katrinka Blazing, M.D.   DATE OF BIRTH:  1950/11/22   DATE OF ADMISSION:  DATE OF DISCHARGE:  LH                                HISTORY & PHYSICAL   HISTORY OF PRESENT ILLNESS:  Mr.  Guerette is a 60 year old male with history  of an enlarging left thyroid mass.  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  The patient was placed on Synthroid for suppression.  The mass was gradually  increasing in size and almost doubled in size by October 2005.  The patient  has had more discomfort with increasing dysphagia.  It appeared to be stable  for a few months but now it is quite large and has increased at least 100%  over the past eight months. In spite of the negative biopsy, the patient is  to have resection of the left thyroid mass and also resection of the right  side based on pathology.   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 This is being followed by  gastroenterology and there is a history of pancreatitis and hypertension.   MEDICATIONS:  1.  Aspirin 81 mg daily.  2.  Lipitor 0.15 mg daily.  3.  Aciphex 20 mg daily.  4.  Lisinopril 20 mg daily.  5.  Reglan 10 mg a.c. and h.s.  6.  Aleve p.r.n.  7.  Benicar 20 mg daily.   PAST SURGICAL HISTORY:  Laparoscopic cholecystectomy and resection of stones  __________ .   SOCIAL HISTORY:  The patient is divorced, unemployed.  Does not drink, smoke  or use drugs.   PHYSICAL EXAMINATION:  VITAL SIGNS:  Blood pressure 160/98, pulse 100,  respirations 20, weight 143 pounds.  HEENT:  Unremarkable.  NECK:  Large, nontender mass of the left lobe of the thyroid encompassing  most of the left lobe with mild  tenderness of the right thyroid lobe.  No  adenopathy is felt.  CHEST:  Clear to auscultation.  HEART:  Regular rate and rhythm without murmur, gallop or rub.  ABDOMEN:  Soft, nontender.  No masses.  EXTREMITIES:  No cyanosis, clubbing or edema.  NEUROLOGIC:  No focal deficits.   IMPRESSION:  1.  Left thyroid mass with rapid enlargement.  2.  Hypertension.  3.  History of pancreatitis.  4.  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   PLAN:  The patient will have subtotal thyroidectomy.      Dirk Dress. Katrinka Blazing, M.D.  Electronically Signed     LCS/MEDQ  D:  07/30/2005  T:  07/31/2005  Job:  [id number]

## 2010-10-24 NOTE — Consult Note (Signed)
NAME:  [name], [name]               ACCOUNT NO.:  [id number]   MEDICAL RECORD NO.:  [id number]          PATIENT TYPE:  INP   LOCATION:  A335                          FACILITY:  [facility]   PHYSICIAN:  R. Roetta Sessions, M.D. DATE OF BIRTH:  1951/01/06   DATE OF CONSULTATION:  07/23/2004  DATE OF DISCHARGE:                                   CONSULTATION   REASON FOR CONSULTATION:  Nausea and vomiting, abdominal pain, and abnormal  abdominal CT.   HISTORY OF PRESENT ILLNESS:  [name] is a pleasant 60 year old gentleman  admitted to the hospital on July 20, 2004 with the above stated  symptoms.  He started having symptoms approximately five days ago.  He went  to the emergency room.  He was told he had mild pancreatitis and was sent  out.  He returned three days ago and was subsequently admitted to the  hospitalists service.  On July 22, 2004 his H&H was 13.0 and 48.8.  With  hydration this dropped down to 11.0 and 34.9.  His MCV is low at 71.3.  Amylase has been mildly elevated at 152 and 139 respectively.  Lipase has  been normal, as has his LFTs.   [name] reports intermittent nausea and vomiting, more diffuse to right-  sided abdominal pain, more or less for the past week.  This morning he had  some dark blood in his bowel movement.  He has had some watery diarrhea  prior to today's episode.   A CT of the abdomen and pelvis yesterday demonstrates a large mass in the  right colon/cecum, bleeding, and intussusception in this same area.  His  pancreas looks good.  He also had a complex cyst in his right kidney.   [name] reports having a colonoscopy here possibly by Dr. Katrinka Blazing some five-  six years ago (documentation of this procedure is not available at this  time).  There is no family history for colorectal neoplasia.   [name] tells me he previously was followed by Dr. Gerda Diss but now is  followed primarily by Dr. Elpidio Anis.  Apparently he has some type of  thyroid problem, for which he is being followed.   PAST MEDICAL HISTORY:  1.  The patient does have a history of pancreatitis felt to be related to      distant alcohol use.  None in the past eight years.  2.  He was seen last year by neurology and cardiology for left-sided      numbness and weakness and a cardiology evaluation.  The neurological      evaluation was unrevealing for any significant disease process.  3.  He is status post cholecystectomy several years ago.  4.  Status post neck surgery for disk disease.  5.  History of hypertension.  6.  No history of diabetes or sickle cell disease.   MEDICATIONS ON ADMISSION:  Advil/aspirin p.r.n.   ALLERGIES:  No known drug allergies.   SOCIAL HISTORY:  The patient is divorced.  No alcohol in eight years.  He  does not drink or use tobacco.  FAMILY HISTORY:  Father died of cirrhosis in his 96s.  He was an alcoholic  by report.  Otherwise no history of chronic GI or liver illness, although  his mother is reported to have stomach problems.   REVIEW OF SYSTEMS:  No recent chest pain, dyspnea.  No fever or chills.   PHYSICAL EXAMINATION:  GENERAL:  This was a pleasant 60 year old gentleman  resting comfortably.  He currently rates his abdominal pain 2/10.  VITAL SIGNS:  He did have a temperature of 100.1 last evening.  Today  temperature is 98.7, BP 141/81, pulse 93.  SKIN:  Warm and dry.  HEENT:  No scleral icterus.  Conjunctivae are somewhat pale.  LUNGS:  Clear to auscultation.  CARDIAC:  Regular rate and rhythm without murmurs, rubs, or gallops.  ABDOMEN:  Nondistended.  Positive bowel sounds.  Soft.  He does have some  right mid abdominal tenderness.  I do not appreciate a discreet mass on  palpation.  No obvious organomegaly.  EXTREMITIES:  No edema.   LABORATORY DATA:  From July 22, 2004 sodium 130 down from 138 on  July 20, 2004.  Potassium 4.6, chloride 107, CO2 of 17, glucose 72, BUN  9, creatinine 1.4,  calcium 8.3.  AST 24, ALT 15, ALP 57, total bilirubin  0.5, amylase 39, lipase 42 on admission.  An ultrasound done July 21, 2004 revealed a 1.2 cm complex cyst in the right kidney, for which a CT scan  was subsequently ordered.   I have reviewed the CT scan with Dr. __________ personally.  The findings  are as stated above.   IMPRESSION:  [name] is a pleasant 60 year old gentleman admitted  to the hospital with abdominal pain, some nausea and vomiting, diarrhea, and  some dark red blood/rectum this morning.  He has a microcytic anemia.  More  importantly, he has a very troublesome mass in the right colon leading in  intussusception.  I am afraid this may well turn out to be colon carcinoma.  I discussed my concerns with [name].  He needs to have his colon  surveyed via colonoscopy in the very near future.  I have discussed the  potential risks, benefits, and alternatives with [name] and his  questions were answered.  He is agreeable.  We will plan to proceed with the  colonoscopy on July 24, 2004.  We will start the prep this afternoon.   He does have a mildly elevated amylase but a normal appearing pancreas on  CT.  I do not think any of his recent gastrointestinal symptoms are coming  from his pancreas.  I do not feel the patient has had recent pancreatitis.      RMR/MEDQ  D:  07/23/2004  T:  07/23/2004  Job:  [id number]   cc:   Dirk Dress. Katrinka Blazing, M.D.  [address]  [place]  [place] [address]  Fax: [phone]   Calvert Cantor, M.D.

## 2010-10-24 NOTE — Group Therapy Note (Signed)
NAMECINSERE, MIZRAHI NO.:  [id number]   MEDICAL RECORD NO.:  [id number]          PATIENT TYPE:  INP   LOCATION:  A335                          FACILITY:  [facility]   PHYSICIAN:  Vania Rea, M.D. DATE OF BIRTH:  [birth year]   DATE OF PROCEDURE:  07/21/2004  DATE OF DISCHARGE:                                   PROGRESS NOTE   SUBJECTIVE:  Hospital day two.  The patient says he continues to have  epigastric pains relieved by morphine.  Still having a tendency for nausea  but is keeping down clear liquids.   PHYSICAL EXAMINATION:  VITAL SIGNS:  Temperature 97.8, pulse 85,  respirations 18, blood pressure 130/75.  HEENT:  Mucous membranes are dry.  CHEST:  Clear to auscultation bilaterally.  CARDIOVASCULAR:  Regular rhythm.  ABDOMEN:  He has diffuse upper abdominal tenderness, no masses.  Abdominal  sounds are markedly decreased.  EXTREMITIES:  Without edema.   LABORATORY DATA:  His white count is 5.2, hemoglobin 12.5, hematocrit 37.5,  platelets 294.  His sodium is 138, potassium 4.6, chloride 106, CO2 26, BUN  10, creatinine 1.4, glucose 69.   ASSESSMENT:  1.  Acute abdominal pain.  2.  Acute and chronic pancreatitis versus gastroenteritis.   PLAN:  The picture seems more like pancreatitis at the moment with the  decreased bowel sounds.  However, presence of bacteria and white cells in  his urine make pyelonephritis also a possibility.  We will get an abdominal  ultrasound so assess for pyelonephritis.  We will continue morphine.  Will  keep him NPO for today but will monitor in and out including stool.      LC/MEDQ  D:  07/21/2004  T:  07/21/2004  Job:  [id number]

## 2010-10-24 NOTE — H&P (Signed)
NAME:  [name], [name]                         ACCOUNT NO.:  [id number]   MEDICAL RECORD NO.:  [id number]                   PATIENT TYPE:  INP   LOCATION:  A217                                 FACILITY:  [facility]   PHYSICIAN:  Vania Rea, M.D.              DATE OF BIRTH:  1950/07/16   DATE OF ADMISSION:  12/28/2003  DATE OF DISCHARGE:                                HISTORY & PHYSICAL   CHIEF COMPLAINT:  Weakness of the left side of the body since this morning.   HISTORY OF PRESENT ILLNESS:  This is a 60 year old African-American man with  a past history of pancreatitis, last attack one year ago, hypertension  apparently fluctuates, currently on no medication, who was laid off from  this work in the [employer] about 2 months ago and has been experiencing  some depression associated with this.  He was apparently in his usual state  of health until last night when he had chest pain which was relieved by  taking Advil.  The patient has been having episodic chest pain for some time  now.  The patient went to bed and woke with numbness and weakness of the  left side of his body and also a headache, left side of his head and neck.  When the patient attempted to walk he noticed he was dizzy and weak on the  left side.  These symptoms were associated with palpitations as sweating and  also blurring of his vision but not double vision and dizziness.  The  patient made his way across the hall to his neighbor in adjoining apartment  and the neighbor brought him to the emergency room in his car.   The patient denies any fever but has been having a cough and a cold the past  2 weeks for which he takes Alka Seltzer Plus and Tylenol.  He denies nausea  or vomiting.   The patient for the past one month he says has had a sudden decrease in his  exercise tolerance.  Exercise tolerance is now down to dyspnea on exertion  of 15 to 20 steps.  He has to stop to rest after going up half a flight of  stairs.  This type of exertion sometimes brings on the chest pain.  The  patient says he has had similar pains, has visited the emergency room and  was told it was heart burn and was given Prilosec or Nexium for it.   The patient currently says he is having chest pain 5/10 which tends to move  to the left side of his chest.  He was sweating and associated with the  incidence.  He has never experienced lower extremity edema.  The patient  describes symptoms of orthopnea and PND ever since his neck surgery in 2001.   PAST MEDICAL HISTORY:  Recurrent pancreatitis, last episode 1 year ago,  status post cholecystectomy years ago, status post  neck surgery about 4  years ago, hypertension episodic.  No history of diabetes, no prior history  of strokes, no history of psychiatric disorder.   MEDICATIONS:  No prescribed medications, has been taking Alka Seltzer,  Tylenol and Advil over-the-counter.   ALLERGIES:  ASPIRIN causes GI intolerance.   SOCIAL HISTORY:  He was divorced a few months ago after 7 years of marriage,  discontinued tobacco, alcohol, and marijuana use about 10 years ago.   FAMILY HISTORY:  He has no children of his own.  Other family history is  significant for hypertension among his brothers and sisters.  His father  died of liver cirrhosis in his 67s.  His mother lived 33.  She also had  hypertension and multiple GI problems.   REVIEW OF SYSTEMS:  Currently having left sided headache and does have a  history of sinusitis.  Wears glasses but does not have glaucoma or  cataracts.  No fever.  Has lost about 5 pounds in the past month.  Has had  depression associated with loss of his job.  Denies anxiety.  Cardiopulmonary system:  Chest pain as described above, 5/10 today,  radiating to the left side, associated with palpitations and sweating,  dyspnea on exertion as described in the HPI.  Genitourinary system:  No  nausea or vomiting, constipation, diarrhea or bloody stool,  no jaundice.  Past history of pancreatitis.  Denies frequency, dysuria, hematuria or  hesitance.  Musculoskeletal:  Denies any joint pains.   PHYSICAL EXAMINATION:  This is an apprehensive looking middle-aged African-  American man lying in bed.  W hat is most noticeable about him is the  fluttering of the eyelids as noted very often in people with conversion  disorders.  His vitals:  Temperature 97.4, pulse 90, blood pressure 133/84.  Noticeable when first seen in the emergency room about 6 hours ago his pulse  was 122 and his respirations were 22.  Currently he is breathing normally,  respiratory rate about 16.  His pain level is described as 5/10.  He is  saturating at 100% on room air.  HEENT:  His pupils are round, equal and reactive to light.  Mucous membrane  is slightly dehydrated.  There is no lymphadenopathy.  There is no jugular  venous distention.  CHEST:  Clear to auscultation bilaterally.  The patient has reproducible  chest pain on springing the sternum.  CARDIOVASCULAR:  He has a regular rhythm, no murmurs.  His abdomen is soft  and nontender.  There is no organomegaly appreciated.  ABDOMEN:  The patient has epigastric tenderness.  EXTREMITIES:  Without edema and his pulses are 2+ bilaterally.  CNS EXAMINATION:  Cranial nerves are grossly intact.  He seems to have  generalized weakness but it is unclear how much of this is psychological  lack of effort, but still he can be assessed as grade 5 power on the right  and grade 4-5 power on the left.  Response to pin prick testing in the leg  from knee to ankle is variable.  Sometimes he acknowledges pin pricks in the  medial side of the left leg, sometimes he does not.  There seems to be no  problem with sensation on the right side of his body or in the upper parts  of his body.  His reflexes are brisk and equal in all areas.  His plantars  are downgoing bilaterally.  ADMISSION LABORATORIES:  CBC:  White count is 4.6,  hemoglobin 11.5,  hematocrit  34.9, MCV is low at 70.  His IAW is 14.2, platelet count 231.  His serum chemistry is normal with the exception of a glucose of 120,  BUN  11, creatinine 1.5, calcium 8.9.  His INR is 1.0.  His point of care cardiac  enzymes are so far negative.  The patient has had a lumbar puncture.  It was  initially bloody but the 4th tube has only 20 RBCs, no white blood cells.  Proteins reported as 22.  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  His MRI is pending.  His EKG shows sinus tachycardia  at 110 done early in his hospital course, with a high QRS voltage but no  clear evidence of ventricular hypertrophy.   ASSESSMENT AND PLAN:  1. Middle aged man presenting with acute left-sided weakness and subjective     sensory impairment.  Differential includes an acute cardiovascular event     but this may also be hysterical in nature.  He has recently lost his wife     to divorce and lost his job and overall seems to be having some features     of hysteria.  Nevertheless, we will proceed with an MRI, MRA to rule out     a cardiovascular event or a neoplasm.  2. Thyroid mass.  It is possible that this could be a type of thyroid     related event, either from hyper or hypothyroidism although the physical     examination does not support this.  Reflexes are not hung-up and are not     particularly brisk.  However, he could have metastatic thyroid neoplasm.     Biopsy of his thyroid mass is     indicated.  3. The patient describes quite significant dyspnea on exertion and needs     cardiac evaluation.  We will get a 2D echo, we will cycle his enzymes and     arrange for a cardiology evaluation.     ___________________________________________                                         Vania Rea, M.D.   LC/MEDQ  D:  12/28/2003  T:  12/28/2003  Job:  [id number]

## 2010-10-24 NOTE — Consult Note (Signed)
NAME:  [name], [name]                         ACCOUNT NO.:  [id number]   MEDICAL RECORD NO.:  [id number]                   PATIENT TYPE:  INP   LOCATION:  A217                                 FACILITY:  [facility]   PHYSICIAN:  [place] Bing, M.D.               DATE OF BIRTH:  1950/09/13   DATE OF CONSULTATION:  12/28/2003  DATE OF DISCHARGE:                                   CONSULTATION   CONSULTING PHYSICIAN:  [place] Bing, M.D.   REFERRING PHYSICIAN:  Vania Rea, M.D.   HISTORY OF PRESENT ILLNESS:  A 60 year old gentleman without previous  cardiac history admitted for left sided neurologic symptoms including  decreased sensation and weakness associated with left sided headache.  Cardiology consultation requested for incidental chest discomfort.  Mr.  Hinners has never previously seen a cardiologist.  He has not had any  significant cardiac testing.  He presented with the acute onset of left  sided numbness and weakness early this morning associated with headache.  There was no dysphasia or dysarthria.  A MRI study has revealed no lesions  that would produce these symptoms.  Soon after he developed these problems,  he experienced lower, sharp, moderately severe, mid subscapular pain that  radiated to the left anterior chest associated with dyspnea and diaphoresis.  There was no nausea.  The duration was approximately 15 minutes.  The  patient was unaware of anything that improved the discomfort or exacerbated  it.  He has not previously had any of these symptoms.   PAST MEDICAL HISTORY:  1. Notable for an admission for pancreatitis last year.  2. He has had a cholecystectomy.  3. C5-C6 cervical diskectomy and fusion.  4. He suffered a left Achilles tendon rupture approximately one year ago.   ALLERGIES:  He reports an adverse reaction to aspirin in the past.   MEDICATIONS:  He takes no medications routinely.  He is currently being  treated with clopidogrel, Lortab, and  Protonix.   SOCIAL HISTORY:  He lives in [place] by himself.  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  He has a 20 pack year history of  cigarette smoking that he discontinued approximately a dozen years ago.  Likewise, abused alcohol in the past but not within this decade.  He has one  son.   FAMILY HISTORY:  Father died at age 40 as a result of cirrhosis related to  excessive alcohol use.  He has five siblings, none of whom have coronary  disease.  His mother died at an advanced age.   REVIEW OF SYSTEMS:  The patient reports recent exertional dyspnea.  His  lifestyle is relatively sedentary.  He had URI symptoms two weeks ago.  He  experiences intermittent diarrhea and abdominal pain.  All other systems  reviewed and are negative.   PHYSICAL EXAMINATION:  GENERAL:  A trim gentleman with some apparent ptosis  and no acute distress.  VITAL SIGNS:  The temperature is 99, heart rate 80 and regular, respirations  18, blood pressure 140/85, weight 139.  HEENT:  Bilateral ptosis as noted.  Pupils equal, round and react to light.  Normal lids and conjunctivae.  No sclerae icterus.  NECK:  No jugular venous distention, no carotid bruits.  ENDOCRINE:  No thyromegaly.  ADENOPATHY:  No cervical, axillary, or inguinal.  LUNGS:  Clear.  CARDIAC:  Normal first and second heart sounds.  Fourth heart sound present.  ABDOMEN:  Soft and nontender.  No masses.  No organomegaly.  Normal bowel  sounds.  EXTREMITIES:  No edema.  Distal pulses intact.  NEUROMUSCULAR:  Breakaway weakness on the left.  Reflexes are normal with  downgoing toes.   EKG:  Sinus tachycardia.  Minimal nonspecific ST segment abnormality.  Otherwise within normal limits.   LABORATORY:  Notable for minimal anemia, borderline creatinine, elevated CSF  protein, and negative point-of-care markers.   IMPRESSION:  Mr. Telfair presents with predominantly neurologic symptoms.  By  his current exam, I  would raise the question of __________  or malingering.  So far, electrocardiograms and cardiac markers are negative.  If this  continues, I would consider him a low risk patient who could be worked up on  an outpatient basis if necessary.  If he is to stay in the hospital, we can  proceed with an echocardiogram and a pharmacologic stress Cardiolite study  on Monday.  A lipid profile is pending.  We appreciate the request for  consultation and will be happy to follow this gentleman with you.      ___________________________________________                                            [place] Bing, M.D.   RR/MEDQ  D:  12/28/2003  T:  12/28/2003  Job:  [id number]

## 2010-10-24 NOTE — Discharge Summary (Signed)
[name], [name]               ACCOUNT NO.:  [id number]   MEDICAL RECORD NO.:  [id number]          PATIENT TYPE:  INP   LOCATION:  A303                          FACILITY:  [facility]   PHYSICIAN:  Calvert Cantor, M.D.     DATE OF BIRTH:  06-01-1951   DATE OF ADMISSION:  10/10/2004  DATE OF DISCHARGE:  05/08/2006LH                                 DISCHARGE SUMMARY   DISCHARGE DIAGNOSES:  1.  Gastrointestinal bleed secondary to hemorrhoids.  2.  Abdominal pain. Etiology unknown.  3.  History of  hypertension.  4.  History of colon cancer in situ.  5.  Anemia of chronic disease   DISCHARGE MEDICATIONS:  Aspirin 81 mg.   HOSPITAL COURSE:  The patient is a 60 year old African American male who was  admitted for centralized abdominal pain. The patient complained of GI bleed  and stool occult was positive. Consult was placed for Dr. Warnell Forester, the  on call GI physician, who evaluated the patient and did a colonoscopy the  following day. Colonoscopy results showed normal-appearing colonic mucosa,  no evidence of diverticulosis. No polyps, masses, or other mucosal lesions.  The patient did have some internal hemorrhoids which at that point were  nonbleeding. This colonoscopy was done after a good prep.  The patient was  followed in the hospital for another day because of abdominal pain and  although his lipase was normal the patient continued to have a small amount  of pain which improved with medication. It was that possibly it could be  secondary to chronic pancreatitis and the patient should have follow-up with  Dr. Karilyn Cota in one to two weeks for possible EUS or ERCP. The patient's pain  remitted on the third day. However, he was homeless and needed to  wait for  our social worker to help him. Therefore, he was kept one more night. On the  following day the social worker assisted the patient in making some  arrangements for him to stay in a motel until his application for subsidized  housing is approved. The patient is going to be discharged home with a  follow-up appointment with Dr. Karilyn Cota in one to two weeks. Currently, he is  not in pain nor is he complaining  of GI bleed.   BLOODWORK:  On May 7th  his white count was 5.2, hemoglobin 11.3. This  stayed stable since admission, when it was 11.8. Hematocrit was 33.8, MCV  low at 73.1, platelet count 263,000.  Reticulocyte count was 29.1, and  reticulocyte percent was 0.6 which is low considering his anemia. PT was  12.3, INR 0.9, PTT 28. Sodium 135, potassium 4.3, chloride 104, bicarbonate  26, glucose 96, BUN 8, creatinine 1.2, calcium 8.2. Iron studies revealed  iron of 53, total iron binding capacity of 322, percent saturation 16, B12  792, folate 6.3, ferritin 97.  Lipase was 61 on admission which dropped to  43 on the following day. Alcohol was less than 2.  UA was negative for  infection, however he did have ketones.   The patient was found to be anemic  with low MCV, however his iron profile  did show signs of iron deficiency, rather it was significant of anemia of  chronic disease.   The patient underwent a CT of the abdomen and pelvis for his abdominal pain  which did not show any acute abnormalities.   DISCHARGE INSTRUCTIONS:  1.  Diet as tolerated.  2.  Please follow up with Dr. Karilyn Cota.      SR/MEDQ  D:  10/14/2004  T:  10/14/2004  Job:  [id number]   cc:   Lionel December, M.D.  [address]  [place]  [place] [address]   Charlaine Dalton. Sherene Sires, M.D. [facility]

## 2010-10-24 NOTE — Op Note (Signed)
NAME:  NELDON, SHEPARD               ACCOUNT NO.:  [id number]   MEDICAL RECORD NO.:  [id number]          PATIENT TYPE:  INP   LOCATION:  A305                          FACILITY:  [facility]   PHYSICIAN:  Dirk Dress. Katrinka Blazing, M.D.   DATE OF BIRTH:  [birth year]   DATE OF PROCEDURE:  07/31/2005  DATE OF DISCHARGE:  08/02/2005                                 OPERATIVE REPORT   PREOPERATIVE DIAGNOSIS:  Left thyroid nodule.   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   PROCEDURE:  Left thyroid lobectomy.   SURGEON:  Dr. Katrinka Blazing.   DESCRIPTION:  Under general anesthesia the neck was prepped and draped in  the sterile field.  Lower collar incision was made.  Skin flaps were  developed.  The strap muscles were separated in the midline.  The large  goiter was noted in the left lobe.  The strap muscles on the left side were  transected in the upper third between two Kocher clamps.  The plane was  mobilized.  Lateral veins were dissected, clipped and divided.  The inferior  pole was dissected, vascular pedicle was identified and controlled and  divided.  The middle thyroid artery was identified and spared.  Because of  the thinness of his neck, we were able to identify both parathyroids.  These  were preserved.  Superior pole was identified.  Superior laryngeal nerve was  identified and was preserved.  The vascular pedicle to the superior pole was  encircled with silk sutures, tied with three sutures and divided.  The  residual pedicle was then clipped with two clips.  Plane was mobilized from  the trachea.  Staying a safe distance from the parathyroids and recurrent  nerve, the gland was then divided between straight hemostats down to the  trachea.  Gland was then separated from various fascia with straight  mosquito clamp.  The isthmus was excised.  The isthmus was divided at its  junction with the right lobe.  This was done through a large straight  mosquito clamp.  The gland  was passed off.  Hemostasis was achieved.  The  right gland was inspected and it was somewhat atrophic and was felt that it  did not need to be done if the pathology was negative.  Frozen section  diagnosis was colloid nodular goiter.  The residual gland was controlled  hemostatically with suture ligatures of 3-0 silk.  Recurrent laryngeal nerve  and parathyroids were reinspected and were not injured.  JP drain was  placed.  It was brought through the inferior flap in the suprasternal area.  The strap muscles were reapproximated using running locking 0 Monocryl.  Strap muscles were reapproximated in the midline with running 3-0 Monocryl.  The platysma and subcutaneous tissues were closed with 3-0 Monocryl.  Skin  was closed with subcuticular 4-0 Vicryl.  The drain was secured with 3-0  nylon.  The patient tolerated procedure well.  Dressing was placed.  He was  awakened from anesthesia without difficulty.  He had no breathing problems.  He was transferred to a bed and taken  to the postanesthetic care unit in  satisfactory condition.     Dirk Dress. Katrinka Blazing, M.D.  Electronically Signed    LCS/MEDQ  D:  10/24/2005  T:  10/26/2005  Job:  [id number]

## 2010-10-24 NOTE — Consult Note (Signed)
[name], [name]               ACCOUNT NO.:  [id number]   MEDICAL RECORD NO.:  [id number]          PATIENT TYPE:  INP   LOCATION:  A303                          FACILITY:  [facility]   PHYSICIAN:  Lionel December, M.D.    DATE OF BIRTH:  12-31-1950   DATE OF CONSULTATION:  10/10/2004  DATE OF DISCHARGE:                                   CONSULTATION   REASON FOR CONSULTATION:  GI bleed and anemia.   HISTORY OF PRESENT ILLNESS:  [name] is a [name]  who was admitted to Dr. Karlyne Greenspan service via emergency room where he  presented with five-day history of passing bright red and burgundy blood per  rectum. He was noted to be mildly anemic.   [name] was admitted to this facility in February of this year for nausea,  vomiting, abdominal pain, diarrhea, and rectal bleeding. At that time, he  was noted to be anemic with a hemoglobin of 11 g and low MCV. He had CT  which showed ileocolonic intussusception. He underwent colonoscopy by Dr.  Jena Gauss on July 24, 2004. He was noted to have few polyps in his colon  along with a complex polyp in his right colon. Thi was removed piecemeal;  however, patient developed vasovagal episode. Therefore, procedure was  terminated. He responded to atropine. He underwent repeat colonoscopy on  July 25, 2004. He had a pedunculated polyp removed from this left colon.  Residual polyp from right mid colon was removed piecemeal. Dr. Jena Gauss felt  that he may still have some residual polyp left. It was felt that if this  had invasive carcinoma he would need right hemicolectomy; however, it showed  adenoma with intramucosal carcinoma. His CEA was 2.3. He felt the rest of  the polyp should be amendable to endoscopic therapy. Plan was for him to  have another colonoscopy later this month. Since his exam, he has had scant  amount of blood with his bowel movements, but for the last five days, he has  been passing more and more blood per rectum.  He continues to complain of  lower abdominal pain which was mainly in hypogastric area. He has not had  any vomiting but does have nausea and intermittent heartburn for which he  uses OTC antacids. He also gives history of 10-pound weight loss. He is not  sure why. He denies postural symptoms.   His usual medications are aspirin 81 mg q.d. and Synthroid question dose  q.d.   While in the hospital, he is on Protonix 40 mg p.o. q.a.m., Dilaudid 2 mg IV  q.4h.   PAST MEDICAL HISTORY:  1.  He has hypothyroidism.  2.  History of hypertension but presently on no therapy.  3.  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 His      amylase was mildly elevated on his last admission, but lipase was      normal.  4.  He had cholecystectomy several years ago.  5.  He had neck surgery in 2002 for disk problems.  6.  Last year, he was admitted with weak  spells. He had cardiac evaluation      and noninvasive studies were negative.   ALLERGIES:  NK.   FAMILY HISTORY:  Noncontributory. One sister is deceased. He has six  siblings living.   SOCIAL HISTORY:  He is divorced. He has one son. He worked at __________ for  seven years, and then he worked at __________ in [place], [place], but  got laid off. He does not smoke cigarettes. He has not had any alcohol in 8  or 10 years.   PHYSICAL EXAMINATION:  GENERAL:  Pleasant, well-developed, African-American  male who is no acute distress. He weighs 141 pounds. He is 5 feet 10 inches  tall. Pulse 82 per minute, blood pressure 150/97, respiratory rate 18, and  temperature 97.4.  HEENT:  Conjunctivae are pink. Sclerae are nonicteric. Oropharyngeal mucosa  is normal. No neck masses are noted.  CARDIAC:  Regular rhythm. Normal S1 and S2. No murmur or gallop noted.  LUNGS:  Clear to auscultation.  ABDOMEN:  Symmetrical. Bowel sounds are normal. Palpation reveals mild vague  generalized tenderness but is mainly in hypogastric  area. No organomegaly or  masses noted.  RECTAL:  One was done in the emergency room.  EXTREMITIES:  No clubbing or edema noted.   LABORATORY DATA:  WBC 4.4, H and H is 11.8 and 35.5, MCV is 72.4, platelet  count 298,000. INR is 0.9, PTT 28. Sodium 136, potassium 4.2, chloride 103,  CO2 26, glucose 95, BUN 11, creatinine 1.2, platelets 0.6. AP 43, AST 22,  ALT 20, total protein 7.3, albumin 3.7, calcium 9.0. Lipase 61 which is  mildly elevated, but amylase was not done.   CT from February 2006 shows classical abnormality for intussusception.   ASSESSMENT:  [name] is a [name] who presents with  lower gastrointestinal bleed. He suspect he is bleeding from residual polyp  in his ascending colon. He has low MCV. He also has chronic abdominal pain.  I suspect he must be having intermittent intussusception to account for his  pain. This may also be an explanation for his GI bleeding as well as his  colonic polyp. Since most of the polyp has been removed by Dr. Jena Gauss, an  another attempt should be made to see if polypectomy could be completed.  Otherwise, he would need right hemicolectomy. I feel he may need right  hemicolectomy anyway, given his recurrent abdominal pain and intussusception  which was well documented less than three months ago.   He has elevated lipase. He had mildly elevated amylase on his last  admission. He may have low grade chronic pancreatitis. I feel may need  further evaluation if he remains with pain. He will need EUS and ERCP. These  studies will have to be performed at tertiary center.   RECOMMENDATIONS:  Will prep him for colonoscopy to be performed by Dr. Katrinka Blazing  in a.m. who is covering for GI service.   He will be given three Dulcolax tablets tonight and drink GoLYTELY early in  the morning and undergo the test around 10 p.m. He would benefit from preoperative low dose atropine in order to prevent vasovagal.   The patient will need  further studies regarding his upper abdominal pain,  possibly due to chronic pancreatitis.   We would like to thank Dr. Butler Denmark for the opportunity to participate in the  care of this gentleman.      NR/MEDQ  D:  10/10/2004  T:  10/11/2004  Job:  [id number]

## 2010-10-24 NOTE — Op Note (Signed)
NAME:  [name], [name]               ACCOUNT NO.:  [id number]   MEDICAL RECORD NO.:  [id number]          PATIENT TYPE:  INP   LOCATION:  A335                          FACILITY:  [facility]   PHYSICIAN:  R. Roetta Sessions, M.D. DATE OF BIRTH:  [birth year]   DATE OF PROCEDURE:  07/24/2004  DATE OF DISCHARGE:                                 OPERATIVE REPORT   PROCEDURE:  Colonoscopy and snare polypectomy/debulking, right colon  polypoid lesion.   INDICATION FOR PROCEDURE:  The patient is a 60 year old gentleman admitted  to the hospital with nausea and vomiting and microcytic anemia, blood per  rectum.  CT demonstrates a right colon mass in the setting of an  intussusception.  Colonoscopy is now being performed.  This approach has  been discussed with the patient at length, potential risks, benefits, and  alternatives have been reviewed, questions answered.  Please see  documentation in the medical record.   PROCEDURE NOTE:  O2 saturation, blood pressure, pulse, and respiration were  monitored throughout the entire procedure.   CONSCIOUS SEDATION:  Versed 4 mg IV, Demerol 75 mg IV, atropine a total of 1  mg given in 0.5 mg doses (see below).   INSTRUMENT USED:  Olympus video chip system.   FINDINGS:  Digital rectal examination revealed no abnormalities.   Endoscopic findings:  Prep was adequate.  Gross examination of the rectum  demonstrated no abnormalities.   Colon:  The colonic mucosa was surveyed from the rectosigmoid junction  throughout its length all the way to the cecum, the ileocecal valve and  appendiceal orifice.  These structures were well-seen and photographed for  the record.  There was a 6 mm polyp at the ileocecal valve, which was  removed with snare cautery.  Next, there was a 5-6 cm large, villous-  appearing pedunculated cauliflower polyp on a stalk in the mid-ascending  colon.  Please see photos.  This was a large lesion but on a stalk.  I  attempted a snare  polypectomy/debulking, taking off several pieces of it  with minimal bleeding.  However, during the rather length maneuver the  patient developed a vagovagal episode with decreased level of response then  some hypotension requiring two doses of 0.5 mg of atropine.  When his  pressure dropped, I elected to terminate the procedure.  The patient never  lost consciousness, and he did respond to IV fluids and the atropine.  Because the hypotension was somewhat protracted, I elected not to attempt  finishing adequate examination of the more distal colon and rectum today.  The patient was transferred to the PACU in stable condition.   IMPRESSION:  1.  Grossly normal-appearing and left colon; however, it was not thoroughly      surveyed.  2.  Pedunculated villous-appearing mass, mid-right colon, debulked with the      snare.  Not mentioned above, one large fragment and multiple smaller      fragments were removed via the Roth net.  3.  A 6 mm pedunculated polyp at the ileocecal valve, removed with snare      cautery.  RECOMMENDATIONS:  I suspect the patient will end up needing a right  hemicolectomy as it is highly likely the large lesion in the right colon  contains carcinoma; however, either way, I feel it would be prudent to re-  look at his colon tomorrow and clear it, particularly getting a good look on  the way out, prior to making further recommendations.  He will be  premedicated with atropine.      RMR/MEDQ  D:  07/24/2004  T:  07/24/2004  Job:  [id number]   cc:   Calvert Cantor, M.D.

## 2010-10-24 NOTE — Group Therapy Note (Signed)
[name] NO.:  [id number]   MEDICAL RECORD NO.:  [id number]          PATIENT TYPE:  INP   LOCATION:  A335                          FACILITY:  [facility]   PHYSICIAN:  Vania Rea, M.D. DATE OF BIRTH:  March 04, 1951   DATE OF PROCEDURE:  DATE OF DISCHARGE:                                   PROGRESS NOTE   Hospital day #4.   SUBJECTIVE:  Patient says he has no complaints this morning.  He has been  told about the results of his CT scan, and he has already been seen by the  gastroenterologist.   PHYSICAL EXAMINATION:  VITALS:  Temperature 98.3, respirations 20, pulse 75,  blood pressure 142/89.  CHEST:  Clear to auscultation bilaterally.  CARDIOVASCULAR:  Regular rhythm.  ABDOMEN:  He now says he is most tender in the right lower quadrant.  He  still has some epigastric tenderness.   LABS:  White count is unremarkable.  His hemoglobin is 12.7.  His serum  chemistry is unremarkable.   His CT scan showed cecal mass with intussusception.  He has been seen by Dr.  Jena Gauss.   ASSESSMENT:  1.  Subacute bowel obstruction.  2.  Vomiting and diarrhea, resolved.  3.  Abdominal pain persisting.   PLAN:  Colonoscopy, as per GI.      LC/MEDQ  D:  07/23/2004  T:  07/23/2004  Job:  [id number]

## 2010-10-24 NOTE — H&P (Signed)
NAME:  Guy, Farrell               ACCOUNT NO.:  [id number]   MEDICAL RECORD NO.:  [id number]          PATIENT TYPE:  INP   LOCATION:  ED99                          FACILITY:  [facility]   PHYSICIAN:  Margaretmary Dys, M.D.DATE OF BIRTH:  Aug 18, 1950   DATE OF ADMISSION:  07/20/2004  DATE OF DISCHARGE:  LH                                HISTORY & PHYSICAL   ADMISSION DIAGNOSES:  1.  Acute gastroenteritis.  2.  Acute abdominal pain.  3.  Urinary tract infection.   CHIEF COMPLAINT:  Diffuse abdominal pain.   HISTORY OF PRESENT ILLNESS:  Mr. [name] is a 60 year old African-  American male with multiple admissions in the past for what appeared to be  pancreatitis.  The patient reports that his pancreatitis was alcohol-related  in the past.  He has been sober for several years now.  He returns today  with nausea and vomiting and diffuse abdominal pain.  He was in the  emergency room on Friday, that was two days ago, and was told that he had  very mild pancreatitis and was sent home.  However, he developed diarrhea  which has eventually with some cramping abdominal pain.  The evaluation in  the emergency room at this time did not reveal any evidence of pancreatitis.  The patient has been admitted for fluid hydration as well as a urinary tract  infection.   He denies any headache, dizziness or lightheadedness.  He has no fever,  chills or rigors, although he said he did feel warm.  He denies any weakness  in any part of his body.  He denies any frequency, urgency or dysuria but  has noticed that he has been going to the bathroom several times at night,  more like nocturia.   PAST MEDICAL HISTORY:  1.  Recurrent pancreatitis about a year ago.  2.  Status post cholecystectomy years ago.  3.  Status post neck surgery four years ago.  4.  History of hypertension.  5.  He had no prior history of stroke, diabetes or psychiatric disorder.   MEDICATIONS:  The patient takes aspirin one a  day and Advil.  Aspirin is 81  mg p.o. daily.   ALLERGIES:  He has no known drug allergies, but aspirin was mentioned as  causing GI intolerance but the patient is on aspirin although on a baby dose  right now.   SOCIAL HISTORY:  He was divorced more than a year and a half ago after seven  years of marriage.  He does not drink alcohol any more, does not drink  tobacco, and stopped illicit drug use about 10 years ago.   FAMILY HISTORY:  He has no children of his own.  His family history is also  significant for hypertension among his brothers.  His father died of liver  cirrhosis in his 55s. His mother lived at 14.  She also had hypertension and  multiple GI problems.   PHYSICAL EXAMINATION:  GENERAL:  The patient was conscious and alert,  comfortable, in mild distress.  He described his pain as 8/10, diffuse  abdominal  pain, vague description.  VITAL SIGNS:  His blood pressure on initial arrival in the emergency room  was 135/111, pulse 126, respiration was 20.  Temperature 97.4.  HEENT:  Normocephalic, atraumatic.  Oral mucosa was moist with no exudates.  NECK:  Supple, no JVD, no lymphadenopathy.  CHEST:  Lungs were clear clinically with good air entry bilaterally.  CARDIAC:  S1, S2, regular, no S3, gallops or rubs.  ABDOMEN:  Soft.  The patient has a mild vague tenderness which is  generalized.  There was no distention, there was no rigidity, and there was  no guarding.  The patient also had no rebound tenderness.  EXTREMITIES:  No pitting pedal edema, no calf induration or tenderness was  noted.  CENTRAL NERVOUS SYSTEM:  Grossly intact with no focal deficits.   LABORATORY/DIAGNOSTIC DATA:  His white blood cell count was 8.3, hemoglobin  14.4, hematocrit 43, MCV was 70.8, platelet count was 265 with no left  shift.  Sodium was 138, potassium 4.1, chloride of 104, CO2 of 26, glucose  of 84, BUN of 12, creatinine 1.5.  Liver function tests were normal.  Calcium was 9.5.  Amylase  139, lipase was 42.  Urinalysis  was unremarkable;  however, his urine microscopy shows 11-20 wbc's and a few bacteria.   ASSESSMENT AND PLAN:  A [name] well-known to [place]  from previous admissions.  He comes in now with what appears to be acute  gastroenteritis.  Chest x-ray and abdominal series done in the emergency  room do not reveal any acute abnormality.  I do think that this is purely  gastroenteritis, likely viral.  He also does not have any significant liver  enzyme elevation suggestive of a pancreatitis unless he has calcified  pancreatitis from chronic pancreatitis.  I do not have any history of that.   A CT of his abdomen and pelvis back in 2004 did not reveal any evidence of  pancreatitis.   Will treat with IV fluids for now and control his pain with morphine.  I  anticipate that the patient will be observed overnight and likely discharged  home.      AM/MEDQ  D:  07/21/2004  T:  07/21/2004  Job:  [id number]

## 2010-10-24 NOTE — H&P (Signed)
   NAME:  [name], [name]                         ACCOUNT NO.:  [id number]   MEDICAL RECORD NO.:  [id number]                   PATIENT TYPE:  INP   LOCATION:  A338                                 FACILITY:  [facility]   PHYSICIAN:  Donna Bernard, M.D.             DATE OF BIRTH:  1951/03/27   DATE OF ADMISSION:  01/20/2003  DATE OF DISCHARGE:                                HISTORY & PHYSICAL   CHIEF COMPLAINT:  Abdominal pain.   SUBJECTIVE:  This patient is a 60 year old black male with a history of  prior bouts of pancreatitis who presented to the emergency room the day of  admission with complaints of significant abdominal pain.  This was  accompanied by significant nausea.  The pain was severe, primarily in the  epigastric region, radiating towards the back.  The patient had been seen  twice in the emergency room at [facility] and [facility] in the prior  several days.  Workup at that time revealed an elevated amylase.  In  addition, the patient had a CT of the abdomen to rule out an appendicitis.  The patient had no significant changes in his bowels.  He had used Vicodin  at home without much success in terms of pain control.  No other chronic  medications.   PRIOR SURGERY:  Remote cholecystectomy.  The patient still has his appendix.   FAMILY HISTORY:  Significant for pancreatitis in two siblings.   SOCIAL HISTORY:  The patient works in [employer].  Does not smoke.  He used  to abuse alcohol significantly.   REVIEW OF SYSTEMS:  Otherwise negative.   PHYSICAL EXAMINATION:  VITAL SIGNS:  Afebrile, blood pressure 142/84.  GENERAL:  The patient is alert, some distress upon presentation.  HEENT:  Normal.  NECK:  Supple.  LUNGS:  Clear.  HEART:  Regular rhythm.  ABDOMEN:  Positive epigastric tenderness, significant in nature.  No rebound  or guarding.  EXTREMITIES:  Thin.  NEUROLOGIC:  Intact.   SIGNIFICANT LABORATORY DATA:  Please see chart.  Amylase elevated at 163,  lipase  52.  MET-7 normal.  CBC:  Hemoglobin 12.4 with a low retic count at  72.7.    IMPRESSION:  1. Acute pancreatitis.  2. Borderline microcytic anemia.   PLAN:  As per orders.                                               Donna Bernard, M.D.    WSL/MEDQ  D:  01/22/2003  T:  01/22/2003  Job:  [id number]

## 2010-10-24 NOTE — Op Note (Signed)
NAMEKHADEN, [name]               ACCOUNT NO.:  [id number]   MEDICAL RECORD NO.:  [id number]          PATIENT TYPE:  INP   LOCATION:  A303                          FACILITY:  [facility]   PHYSICIAN:  Stana Bunting, M.D.  DATE OF BIRTH:  December 05, 1950   DATE OF PROCEDURE:  10/11/2004  DATE OF DISCHARGE:                                 OPERATIVE REPORT   PROCEDURE:  Colonoscopy.   INDICATIONS FOR PROCEDURE:  Mr. Cashman is a 60 year old African-American  gentleman who was recently admitted to the hospital for rectal bleeding. He  previously underwent colonoscopies by Dr. Jena Gauss in February at which time he  had a large right sided polyp removed. He currently is hemodynamically  stable.   PROCEDURE NOTE:  Oxygen, blood pressure, and pulse as well as respiratory  status was monitored through the procedure. The patient received Demerol 50  mg and Versed 4 mg throughout the procedure. The Olympus video chip system  was used.   ENDOSCOPIC FINDINGS:  The entire colonic mucosa appeared normal. There was  no evidence of diverticulosis. I did not appreciate any polyps, masses, or  other mucosal lesions. There was no evidence of ischemic changes.  Careful  attention was paid to the right colon with several passes made through this  area, but no abnormal mucosa or residual polyp was seen. The appendiceal  orifice was clearly identified and photo documentation was obtained. The  distal 10 cm of the terminal ileum appeared normal. There was no blood seen  throughout the colon. On retroflexion, there were some internal hemorrhoids,  but these were nonbleeding. Of note, there were a few areas throughout the  colon in which the prep was not great, with some areas of residual stool;  however, overall, the prep quality was adequate. No source of bleeding was  found on this exam.   IMPRESSION:  Normal colonoscopy to the terminal ileum.   PLAN:  I have been unable to find the pathology report from the  patient's  recent colonoscopy. I will discuss this with Dr. Karilyn Cota and Dr. Jena Gauss and  see what steps they would like to take next. Certainly, based on the  available data, he should have a colonoscopy no later than one year. Will  currently follow him while he is in the hospital. There is no evidence of a  upper GI bleed.      RSS/MEDQ  D:  10/11/2004  T:  10/11/2004  Job:  [id number]

## 2010-10-24 NOTE — H&P (Signed)
[name], [name]               ACCOUNT NO.:  [id number]   MEDICAL RECORD NO.:  [id number]          PATIENT TYPE:  EMS   LOCATION:  ED                            FACILITY:  [facility]   PHYSICIAN:  Calvert Cantor, M.D.     DATE OF BIRTH:  28-Aug-1950   DATE OF ADMISSION:  10/10/2004  DATE OF DISCHARGE:  LH                                HISTORY & PHYSICAL   GASTROENTEROLOGIST:  Dr. Karilyn Cota.   PRESENTING COMPLAINT:  Rectal bleeding.   HISTORY OF PRESENT ILLNESS:  This is a [name] who  was admitted in February of 2006 for abdominal pain.  He underwent a  colonoscopy during that admission which showed introsusception with multiple  polyps.  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  The patient was scheduled for a repeat colonoscopy for May 21.  The patient  states that he has been having rectal bleeding since this past Sunday and  therefore it has been six days.  He states he has dark blood mixed with  stool and also he notices blood on the toilet paper after he wipes.  He has  been having abdominal pain, mainly in the suprapubic and left lower quadrant  region for the past week as well.  Pain is relieved by Vicodin.  He also  notices some dizziness and weakness upon standing and walking.  He also  complains of nausea, however has been eating without any vomiting.   REVIEW OF SYSTEMS:  Negative for shortness of breath and chest pain.  The  patient states that he has had loose stools ever since his colonoscopy.  He  does not complain of dysuria.  No complaints of fevers, of headaches.   PAST MEDICAL HISTORY:  Significant for:  1.  History of pancreatitis.  2.  Hypertension.  3.  Colon cancer in situ.   PAST SURGICAL HISTORY:  1.  Cholecystectomy.  2.  Neck surgery.  3.  Colonoscopy with polypectomy.   ALLERGIES:  No known drug allergies.   CURRENT MEDICATIONS:  1.  Aspirin 81 mg daily.  2.  Vicodin p.r.n.  3.  Phenergan 25 mg  p.r.n.   SOCIAL HISTORY:  He is a nonsmoker, does not drink alcohol.  The patient is  divorced.   FAMILY HISTORY:  He has family history of hypertension.  His father died in  his 17s of cirrhosis of the liver.  Mother died at age 39.  She had a  history of hypertension and GI problems.   PHYSICAL EXAMINATION:  The patient is awake and alert, lying in bed, does  not appear in any distress.  Temperature is 99.6, blood pressure 150/84,  pulse 99, respiratory rate 18, pulse oximetry 100% on room air.  HEENT:  Normocephalic and atraumatic.  Pupils equal, round and reactive to  light and accommodation.  Conjunctiva is pale.  Oral mucosa is moist.  NECK:  Supple.  HEART:  Regular rate and rhythm.  LUNGS:  Clear.  ABDOMEN:  Soft.  There is mild to moderate tenderness in the suprapubic and  left  lower quadrant.  There is no guarding, no rebound tenderness.  Bowel  sounds are positive.  EXTREMITIES:  No cyanosis, clubbing or edema.   LABORATORIES:  Blood work:  White count is 4.0, hemoglobin 11.8, hematocrit  35.5.  MCV 72.4, platelets 298.  PT 12.3, INR 0.9, PTT 28.  Sodium 136,  potassium 4.2, chloride 103, bicarb 26, glucose 95, BUN 11, creatinine 1.2,  total bilirubin 0.6.  AST 22, ALT 20, alkaline phosphatase 43, calcium 9.0,  lipase 61.  Alcohol level less than 2.   ASSESSMENT AND PLAN:  This is a [name] who  recently underwent polypectomies and was found to have carcinoma in situ who  is being admitted now for recurrent GI bleed.  The patient has iron  deficiency anemia.  Vital signs have been normal since admission, not  showing any hypotension.  The patient is going to be admitted.  He will be  started on p.o. Protonix.  He will be symptomatically medicated for nausea  and pain.  Consult will be placed for Dr. Jena Gauss and Dr. Karilyn Cota.  He will be  placed on IV fluids and hemoglobin will be followed up in the morning.      SR/MEDQ  D:  10/10/2004  T:   10/10/2004  Job:  [id number]

## 2010-10-24 NOTE — Discharge Summary (Signed)
NAME:  [name], [name]               ACCOUNT NO.:  [id number]   MEDICAL RECORD NO.:  [id number]          PATIENT TYPE:  INP   LOCATION:  A305                          FACILITY:  [facility]   PHYSICIAN:  Dirk Dress. Katrinka Blazing, M.D.   DATE OF BIRTH:  November 25, 1950   DATE OF ADMISSION:  07/31/2005  DATE OF DISCHARGE:  02/25/2007LH                                 DISCHARGE SUMMARY   DISCHARGE DIAGNOSES:  1.  Left colloid nodular goiter.  2.  History of multiple adenomatous polyps of the colon with carcinoma in      situ.  3.  History of pancreatitis.  4.  Hypertension.   SPECIAL PROCEDURE:  Left lateral upper lobectomy July 31, 2005.   DISPOSITION:  The patient discharged home in stable satisfactory condition.   DISCHARGE MEDICATIONS:  1.  Hydrocodone 5/500 q.i.d. p.r.n.  2.  Aspirin 81 mg daily.  3.  Levothroid 150 mcg daily.  4.  AcipHex 20 mg daily.  5.  Lisinopril 20 mg daily.  6.  Metoclopramide 10 mg 4 times daily.  7.  Levsin 0.125 mg before meals and at bedtime.  8.  Creon 10 two capsules with meals.   The patient is scheduled to be seen in the office in 10 days.  He is  instructed in the procedure to empty his JP drain once daily.   SUMMARY:  A 60 year old male with history of an enlarging left thyroid mass.  The mass was biopsied July 2005 and pathology showed benign cystic colloid  nodule with degenerative changes.  He was placed on Synthroid for  suppression.  The mass was gradually increasing in size and almost doubled  in size by October 2005.  He was having more discomfort with increasing  dysphasia.  The mass was quite large and had increased at least 100% over  the past 8 months.  In spite of the negative biopsy, it was felt that  resection of the left thyroid mass and resection of the right side would be  indicated based on pathology.  The patient was admitted through day surgery  and underwent  left thyroid lobectomy with resection of the isthmus.  Paths reveal colloid  nodular goiter without any evidence of malignancy.  The patient did  exceptionally well in the postoperative period and was discharged home on  the morning of the second postoperative day in satisfactory condition.      Dirk Dress. Katrinka Blazing, M.D.  Electronically Signed     LCS/MEDQ  D:  10/24/2005  T:  10/25/2005  Job:  [id number]

## 2010-10-24 NOTE — H&P (Signed)
NAME:  MILDRED, BOLLARD               ACCOUNT NO.:  [id number]   MEDICAL RECORD NO.:  [id number]          PATIENT TYPE:  AMB   LOCATION:                                FACILITY:  [facility]   PHYSICIAN:  R. Roetta Sessions, M.D. DATE OF BIRTH:  July 22, 1950   DATE OF ADMISSION:  DATE OF DISCHARGE:  LH                                HISTORY & PHYSICAL   HISTORY OF PRESENT ILLNESS:  Hematochezia.  Melena.  Mr. Vanblarcom is a 60-year-  old African-American male recently hospitalized at [facility], five-  day history of bright red blood per rectum.  He was admitted early this  month, was admitted back in February with similar symptoms.  He underwent  colonoscopy by me.  He was found to have multiple polyps with a complex  polyp in the right colon.  Colonoscopy had to be done twice over two days  because of initial vasovagal episode.  Had an adenoma with intramucosal  carcinoma back in February and it was felt that some of the polyp may have  remained behind; however, when he was readmitted earlier this month, he  underwent a colonoscopy by Dr. Warnell Forester, who found a normal colon.  The  terminal ileum also appeared normal.  It was recommended that he have  another colonoscopy in one year.  Mr. Desilets has had some upper abdominal  discomfort with some reflux symptoms recently and also reports passing some  black, tarry stools as well as intermittent blood per rectum since  discharge.  He has not had any blood work.  He is currently home, where he  is followed by W. Simone Curia, M.D.   He has a history of pancreatitis, which is likely secondary to a history of  distant alcohol use.  Amylase was mildly elevated previously.  He is status  post cholecystectomy.  He is status post neck surgery in 2002.  He had a  negative cardiac workup for weak spells in 2005.  He has last 10 pounds  since the first of the year.  He does complain of some early satiety but no  odynophagia, no dysphagia, and  certainly no hematemesis.  He has a history  of intussusception seen on imaging studies in February, felt to be related  to the large right colon polyp, which was removed.  Hypothyroidism.   CURRENT MEDICATIONS:  1.  Phenergan 25 mg p.r.n.  2.  Vicodin p.r.n.  3.  Synthroid once daily.  4.  ASA 81 mg daily.   ALLERGIES:  No known drug allergies.   FAMILY HISTORY:  Negative for chronic GI or liver disease.   SOCIAL HISTORY:  The patient is divorced.  He has one son.  He is currently  unemployed.  He does not smoke cigarettes, has not used alcohol in 12 years.   REVIEW OF SYSTEMS:  No chest pain or dyspnea on exertion.  No fever or  chills.  Otherwise as in history of present illness.   PHYSICAL EXAMINATION:  GENERAL:  A pleasant 60 year old gentleman resting  comfortably.  VITAL SIGNS:  Height 5  feet 10 inches.  He was not weighed today.  Temperature 98.1, BP 110/76, pulse 88.  SKIN:  Warm and dry.  HEENT:  No scleral icterus.  Conjunctivae are muddy.  Dentition in a poor  state of repair.  No buccal lesions.  NECK:  JVD is not prominent.  CHEST:  Lungs are clear to auscultation.  CARDIAC:  Regular rate and rhythm without murmur, gallop, or rub.  ABDOMEN:  Nondistended, positive bowel sounds.  He has minimal epigastric  tenderness to palpation, no appreciable mass or organomegaly.  RECTAL:  One small external hemorrhoidal tag noted.  Good sphincter tone.  Prostate not enlarged.  No mass in the rectal vault.  No stool in the rectal  vault.  Mucus is Hemoccult-negative.   Other labs are pending.   IMPRESSION:  Mr. Brayboy is a 60 year old gentleman.  He complains of  persisting low-volume hematochezia and has symptoms he also describes as  melena recently in a setting of vague epigastric pain and worsening reflux  symptoms.   He looks quite stable at this time.  I am somewhat perplexed as to why he  continues to have gross blood per rectum in small volume.   With his history  of dark, tarry stools, we need to go ahead and look at his  upper gastrointestinal tract.  Chronic pancreatitis may or may not be  playing a role at this time.  He does have early satiety as well.   RECOMMENDATIONS:  1.  Will go ahead and start him on a proton pump inhibitor at this time,      Aciphex 20 mg orally daily.  2.  Ask him to stop the aspirin.  3.  Check  CBC, LFTs, amylase and lipase, then plan for an EGD in the very      near future.  Will review labs when they become available.      RMR/MEDQ  D:  10/27/2004  T:  10/27/2004  Job:  [id number]   cc:   Calvert Cantor, M.D.

## 2010-10-24 NOTE — Op Note (Signed)
NAME:  [name], [name]               ACCOUNT NO.:  [id number]   MEDICAL RECORD NO.:  [id number]          PATIENT TYPE:  INP   LOCATION:  A335                          FACILITY:  [facility]   PHYSICIAN:  R. Roetta Sessions, M.D. DATE OF BIRTH:  1951/05/16   DATE OF PROCEDURE:  07/25/2004  DATE OF DISCHARGE:                                 OPERATIVE REPORT   PROCEDURE:  Colonoscopy and snare polypectomy.   INDICATIONS FOR PROCEDURE:  The patient is a 60 year old gentleman with  rectal bleeding who underwent a colonoscopy yesterday. Was found to have a  very large pedunculated polyp with lesion in the mid right colon which was  piecemeal removed. The patient developed a vasovagal episode during the  prolonged procedure. The procedure had to be determined before the colon was  completely surveyed. He responded nicely to atropine. He has done well over  night. Pathology on the right colon polyp remains pending. Because I did not  adequately survey the left colon and rectum yesterday, he is being brought  back to complete the examination. This approach has been discussed with the  patient at length. Potential risks, benefits, and alternatives have been  reviewed and questions answered.   PROCEDURE NOTE:  O2 saturation, blood pressure, pulse, and respirations  monitored throughout the entirety of the procedure. Conscious sedation with  Versed 4 mg IV and Demerol 75 mg IV. Atropine 0.5 mg IV was given prior to  the initiation of the procedure.   INSTRUMENT:  Olympus video chip system.   FINDINGS:  Digital rectal examination revealed no abnormalities.   ENDOSCOPIC FINDINGS:  Prep was good.   Rectum:  Examination of the rectal mucosa including retroflexed view of anal  verge revealed no abnormalities.   Colon:  Colonic mucosa was surveyed from the rectosigmoid junction through  the left, transverse, and right colon to area of the appendiceal orifice,  ileocecal valve, and cecum. These structures  were well seen and photographed  for the record. Olympus video scope was slowly withdrawn, and all previously  mentioned mucosal surfaces were again seen. On the way in, the patient was  noted to have two 6-mm pedunculated polyps at 35 cm. They were removed with  snare cautery and recovered through the scope. The large lesion seen in the  mid right colon was again identified. It was significantly lesser in size  given partial polypectomy yesterday. Please see photos. I elected to go  ahead and piecemeal remove additional material. Some of this polypoid lesion  was quite hard and ulcerated. It was on a fold, and the back side was  difficult to see, pretty well resected all of the polypoid lesion that could  be seen; however, I suspect there is some that remains behind the fold.  Please see photos. Multiple pieces were submitted to the pathologist.  Remainder of colonic mucosa appeared normal. The patient tolerated the  procedure very well this time and was reactive to endoscopy.   IMPRESSION:  1.  Normal rectum.  2.  Pedunculated polyps, left colon, removed with snare as described above.  Residual polypoid mass, mid right colon, removed additionally with      piecemeal snare polypectomy. I suspect some of the base of this polypoid      lesion remains behind a fold. Multiple fragments submitted to the      pathologist.   I suspect somewhere in this polypoid lesion invasive carcinoma will be  found.   I have called the pathologist, but I do not have report from yesterday's  submission.   RECOMMENDATIONS:  1.  Clear liquid diet.  2.  Possible surgical consultation pending review of pathology.  3.  Dr. Roena Malady will be seeing over the weekend as needed. I have discussed      my findings and recommendations with Dr. Orvan Falconer.      RMR/MEDQ  D:  07/25/2004  T:  07/25/2004  Job:  [id number]   cc:   Calvert Cantor, M.D.

## 2010-10-24 NOTE — Op Note (Signed)
[place]  Patient:    [name], [name]                      MRN: [id number] Proc. Date: 11/20/99 Adm. Date:  [id number] Disc. 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  OPERATION:   C5-6 anterior cervical diskectomy and fusion with allograft.  SURGEON:  Julio Sicks, M.D.  ASSISTANT:  Donzetta Sprung. Roney Jaffe., MD  ANESTHESIA:  General endotracheal anesthesia.  INDICATIONS:  [name] is a 60 year old male with a history of neck and bilateral upper extremity pain consistent with C5-6 radiculopathy.  He has failed conservative management.  MRI scanning and CT myelography demonstrate advanced spondylosis at the C5-6 level with neural foraminal stenosis.  The patient has been counselled as to his options.  He had decided to proceed with a C5-7 anterior cervical diskectomy and fusion with allograft for hopeful relief of his symptoms.  DESCRIPTION OF PROCEDURE:  The patient was taken to the operating room and placed on operating table in supine position.  After an adequate level of anesthesia was achieved, the patient was positioned supine with neck slightly extended and held in place with Halter traction.  The patients anterior cervical region was shaved and prepped sterilely.  A #10 blade is used to make a linear incision extending from just right of midline to the medial border of the sternocleidomastoid muscle on the right.  This was carried down sharply to the platysma.  The platysma was then elevated and divided vertically. Dissection proceeded along the medial border of the sternocleidomastoid muscle and carotid sheath on the right.  Trachea and esophagus were mobilized and reflected towards the left.   Prevertebral fascia was stripped off the anterior spinal column.  Longus  coli muscles then elevated bilaterally using electrocautery.  Deep self-retaining retractor was placed.  X-rays taken, and level was confirmed.  The disk space was then incised with #15 blade in rectangular fashion.  A wide disk space was then achieved using pituitary rongeurs forward and backward using curettes, Kerrison rongeurs, and the high speed drill.  All elements of the disk were removed to the level of the posterior annulus.  The microscope was brought into the field and used throughout the remainder of the diskectomy.  The remaining aspect of the annulus and osteophytes were removed down to the posterior arch limb. The posterior arch limb was then elevated and resected in piecemeal fashion using Kerrison rongeurs.  Underlying thecal sac was identified.  Decompression then proceeded down to the left side of C6 foramen.  The C6 nerve root was identified and decompressed along its course using Kerrison rongeurs.  At this point, blunt probe was passed easily along the course of the C6 nerve root. There was no visible continued compression.  Decompression then proceeded down to the right side of C6 foramen.  Once again, the nerve root was identified and widely decompressed.  At this point, there was no evidence injury to thecal sac or nerve roots.  There was no evidence of any continued compression.  The wound was then copiously irrigated with antibiotic solution. Gelfoam was placed topically for hemostasis which was found to be good.  The disk space was then distracted,  and the 7 mm fibular allograft was impacted into place.  It was recessed approximately 1 mm from the anterior cortical margin.  Microscope and retractors were removed.  Followup x-ray revealed good position of bone graft at proper level with normal ______ spine.  Wound was inspected for hemostasis and found to be good.   It was irrigated one final time.  Platysmus was then reapproximated with 3-0 Vicryl suture.  Skin  was reapproximated with 5-0 PDS in inverse subcuticular fashion.  Steri-Strips and a sterile dressing were applied.  There were no apparent complications.  The patient left the operating room, and he returns to the recovery room postoperatively. DD:  11/20/99 TD:  11/24/99 Job: [id number]

## 2010-10-24 NOTE — Op Note (Signed)
NAME:  [name], [name]               ACCOUNT NO.:  [id number]   MEDICAL RECORD NO.:  [id number]          PATIENT TYPE:  AMB   LOCATION:  DAY                           FACILITY:  [facility]   PHYSICIAN:  R. Roetta Sessions, M.D. DATE OF BIRTH:  Apr 22, 1951   DATE OF PROCEDURE:  10/30/2004  DATE OF DISCHARGE:                                 OPERATIVE REPORT   PROCEDURE:  Diagnostic esophagogastroduodenoscopy.   INDICATION FOR PROCEDURE:  A 60 year old gentleman with intermittent dark  blood per rectum.  Recent colonoscopy demonstrated no abnormalities.  He has  had some vague epigastric pain with worsening reflux symptoms more recently.  He was started on Aciphex 20 mg orally daily through my office on Oct 27, 2004.  CBC demonstrated a white count of 4.9, hemoglobin 12.3, MCV 72.3.  Amylase slightly elevated at 107, lipase is 70.  LFTs were okay.  Because of  the patient's persistent symptoms of GI bleeding and he has some upper GI  tract symptoms, we are going to look at his upper GI tract today.  This  approach has been discussed with the patient at length, the potential risks,  benefits, and alternatives have been reviewed, questions answered.   PROCEDURE NOTE:  O2 saturation, blood pressure, pulse, and respiration were  monitored throughout the entire procedure.   CONSCIOUS SEDATION:  Versed 4 mg IV, Demerol 75 mg IV in divided doses.   INSTRUMENT USED:  Olympus video chip system.   FINDINGS:  EGD:  Examination of the tubular esophagus revealed a few distal  esophageal erosions.  The esophageal mucosa otherwise appeared normal.  EG  junction easily traversed.   Stomach:  The gastric cavity was empty and insufflated well with air.  A  thorough examination of the gastric mucosa including a retroflexed view of  the proximal stomach and esophagogastric junction demonstrated only a small  hiatal hernia.  Pylorus patent and easily traversed.  Examination of the  bulb and second portion  revealed no abnormalities.   Therapeutic/diagnostic maneuvers performed:  None.   The patient tolerated the procedure well and was reacted in endoscopy.   IMPRESSION:  Distal esophageal erosions consistent with erosive reflux,  small hiatal hernia.  The remainder of upper gastrointestinal tract appeared  normal.   RECOMMENDATIONS:  1.  Continue Aciphex 20 mg orally daily.  2.  Will plan to see him back in the office in two to three weeks and will      recheck a CBC at that time.  Will go ahead and also have him return      three Hemoccults between now and the time of follow-up visit.      RMR/MEDQ  D:  10/30/2004  T:  10/30/2004  Job:  [id number]   cc:   Calvert Cantor, M.D.

## 2010-11-14 ENCOUNTER — Encounter: Payer: Self-pay | Admitting: Internal Medicine

## 2011-03-03 LAB — COMPREHENSIVE METABOLIC PANEL
ALT: 119 — ABNORMAL HIGH
ALT: 22
AST: 119 — ABNORMAL HIGH
AST: 128 — ABNORMAL HIGH
AST: 19
AST: 25
AST: 81 — ABNORMAL HIGH
Albumin: 2.8 — ABNORMAL LOW
Albumin: 3.2 — ABNORMAL LOW
Albumin: 3.4 — ABNORMAL LOW
Albumin: 3.4 — ABNORMAL LOW
Albumin: 3.5
Albumin: 3.7
Alkaline Phosphatase: 39
Alkaline Phosphatase: 43
Alkaline Phosphatase: 44
BUN: 11
BUN: 12
BUN: 4 — ABNORMAL LOW
BUN: 5 — ABNORMAL LOW
BUN: 5 — ABNORMAL LOW
BUN: 7
BUN: 8
CO2: 23
CO2: 26
CO2: 27
CO2: 28
Calcium: 8.7
Calcium: 8.7
Calcium: 9
Calcium: 9.8
Chloride: 105
Chloride: 106
Chloride: 106
Chloride: 107
Chloride: 107
Chloride: 108
Chloride: 108
Creatinine, Ser: 1.22
Creatinine, Ser: 1.24
Creatinine, Ser: 1.29
Creatinine, Ser: 1.33
GFR calc Af Amer: >60
GFR calc Af Amer: >60
GFR calc Af Amer: >60
GFR calc Af Amer: >60
GFR calc non Af Amer: 52 — ABNORMAL LOW
GFR calc non Af Amer: 57 — ABNORMAL LOW
GFR calc non Af Amer: >60
GFR calc non Af Amer: >60
Glucose, Bld: 103 — ABNORMAL HIGH
Glucose, Bld: 103 — ABNORMAL HIGH
Glucose, Bld: 108 — ABNORMAL HIGH
Glucose, Bld: 92
Glucose, Bld: 97
Potassium: 3.6
Potassium: 3.6
Potassium: 4.4
Potassium: 4.5
Sodium: 137
Sodium: 138
Sodium: 140
Sodium: 141
Sodium: 141
Total Bilirubin: 0.5
Total Bilirubin: 0.5
Total Bilirubin: 0.6
Total Bilirubin: 0.6
Total Bilirubin: 0.7
Total Protein: 5.8 — ABNORMAL LOW
Total Protein: 6.1
Total Protein: 7
Total Protein: 7.5

## 2011-03-03 LAB — DIFFERENTIAL
Basophils Absolute: 0
Basophils Absolute: 0
Basophils Absolute: 0
Basophils Absolute: 0
Basophils Absolute: 0
Basophils Absolute: 0
Basophils Relative: 0
Basophils Relative: 0
Basophils Relative: 0
Basophils Relative: 0
Basophils Relative: 0
Basophils Relative: 0
Basophils Relative: 0
Eosinophils Absolute: 0.1
Eosinophils Absolute: 0.3
Eosinophils Relative: 3
Eosinophils Relative: 7 — ABNORMAL HIGH
Lymphocytes Relative: 22
Lymphocytes Relative: 25
Lymphocytes Relative: 31
Lymphocytes Relative: 32
Lymphs Abs: 1
Lymphs Abs: 1.3
Lymphs Abs: 1.9
Monocytes Absolute: 0.3
Monocytes Absolute: 0.4
Monocytes Absolute: 0.4
Monocytes Absolute: 0.5
Monocytes Absolute: 0.5
Monocytes Relative: 10
Monocytes Relative: 11
Monocytes Relative: 12
Monocytes Relative: 7
Monocytes Relative: 7
Monocytes Relative: 8
Neutro Abs: 2.3
Neutro Abs: 2.5
Neutro Abs: 2.6
Neutro Abs: 2.8
Neutro Abs: 3.1
Neutro Abs: 3.4
Neutro Abs: 3.7
Neutrophils Relative %: 52
Neutrophils Relative %: 60
Neutrophils Relative %: 61

## 2011-03-03 LAB — PROTIME-INR
INR: 1.1
INR: 1.1
INR: 1.7 — ABNORMAL HIGH
INR: 2.1 — ABNORMAL HIGH
INR: 2.2 — ABNORMAL HIGH
Prothrombin Time: 14.2
Prothrombin Time: 21 — ABNORMAL HIGH
Prothrombin Time: 24.6 — ABNORMAL HIGH
Prothrombin Time: 25.1 — ABNORMAL HIGH

## 2011-03-03 LAB — CBC
HCT: 32.7 — ABNORMAL LOW
HCT: 33.9 — ABNORMAL LOW
HCT: 34.5 — ABNORMAL LOW
HCT: 36.2 — ABNORMAL LOW
HCT: 37.1 — ABNORMAL LOW
HCT: 37.3 — ABNORMAL LOW
HCT: 40
Hemoglobin: 11.2 — ABNORMAL LOW
Hemoglobin: 11.4 — ABNORMAL LOW
Hemoglobin: 11.9 — ABNORMAL LOW
Hemoglobin: 12.2 — ABNORMAL LOW
Hemoglobin: 12.3 — ABNORMAL LOW
Hemoglobin: 13
MCHC: 32.3
MCHC: 32.5
MCHC: 32.6
MCHC: 33
MCV: 73.8 — ABNORMAL LOW
MCV: 74.2 — ABNORMAL LOW
MCV: 74.9 — ABNORMAL LOW
Platelets: 233
Platelets: 234
Platelets: 256
Platelets: 262
Platelets: 325
RBC: 4.68
RBC: 5.06
RBC: 5.08
RBC: 5.39
RDW: 13.6
RDW: 13.9
RDW: 13.9
RDW: 14
RDW: 14.1
RDW: 14.2
WBC: 4
WBC: 4.2
WBC: 4.5
WBC: 5.2
WBC: 5.9
WBC: 6.2

## 2011-03-03 LAB — HEPATITIS PANEL, ACUTE
HCV Ab: NEGATIVE
HCV Ab: NEGATIVE
Hep A IgM: NEGATIVE
Hep B C IgM: NEGATIVE
Hepatitis B Surface Ag: NEGATIVE
Hepatitis B Surface Ag: NEGATIVE

## 2011-03-03 LAB — PROTEIN S ACTIVITY
Protein S Activity: 104 % (ref 69–129)
Protein S Activity: 64 % — ABNORMAL LOW (ref 69–129)

## 2011-03-03 LAB — URINALYSIS, ROUTINE W REFLEX MICROSCOPIC
Bilirubin Urine: NEGATIVE
Glucose, UA: NEGATIVE
Hgb urine dipstick: NEGATIVE
Ketones, ur: NEGATIVE
pH: 6

## 2011-03-03 LAB — LIPASE, BLOOD: Lipase: 57

## 2011-03-03 LAB — PROTEIN C, TOTAL
Protein C, Total: 76 % (ref 70–140)
Protein C, Total: 81 % (ref 70–140)

## 2011-03-03 LAB — BETA-2-GLYCOPROTEIN I ABS, IGG/M/A
Beta-2-Glycoprotein I IgA: 4 U/mL (ref <10)
Beta-2-Glycoprotein I IgM: 11 U/mL (ref <10)

## 2011-03-03 LAB — PROTEIN C ACTIVITY: Protein C Activity: 115 % (ref 75–133)

## 2011-03-03 LAB — LUPUS ANTICOAGULANT PANEL: PTT Lupus Anticoagulant: 48.5 (ref 36.3–48.8)

## 2011-03-03 LAB — BILIRUBIN, DIRECT: Bilirubin, Direct: 0.1

## 2011-03-03 LAB — FACTOR 5 LEIDEN

## 2011-03-03 LAB — PROTEIN S, TOTAL: Protein S Ag, Total: 113 % (ref 70–140)

## 2011-03-03 LAB — LIPID PANEL: VLDL: 10

## 2011-03-03 LAB — HOMOCYSTEINE: Homocysteine: 12.3

## 2011-03-03 LAB — PROTHROMBIN GENE MUTATION

## 2011-03-05 LAB — COMPREHENSIVE METABOLIC PANEL
AST: 23
Albumin: 3.9
Chloride: 104
Creatinine, Ser: 1.49
GFR calc Af Amer: 59 — ABNORMAL LOW
Potassium: 4.2
Total Bilirubin: 0.8
Total Protein: 7.8

## 2011-03-05 LAB — URINALYSIS, ROUTINE W REFLEX MICROSCOPIC
Ketones, ur: NEGATIVE
Protein, ur: NEGATIVE
Urobilinogen, UA: 0.2

## 2011-03-05 LAB — CBC
Platelets: 259
RDW: 15.7 — ABNORMAL HIGH
WBC: 4.1

## 2011-03-05 LAB — DIFFERENTIAL
Basophils Absolute: 0
Eosinophils Relative: 3
Lymphocytes Relative: 27
Monocytes Absolute: 0.4
Monocytes Relative: 9
Neutro Abs: 2.5

## 2011-03-05 LAB — APTT: aPTT: 47 — ABNORMAL HIGH

## 2011-03-05 LAB — PROTIME-INR: INR: 2.5 — ABNORMAL HIGH

## 2012-01-28 ENCOUNTER — Encounter (HOSPITAL_COMMUNITY): Payer: Self-pay | Admitting: Emergency Medicine

## 2012-01-28 DIAGNOSIS — D131 Benign neoplasm of stomach: Secondary | ICD-10-CM | POA: Diagnosis present

## 2012-01-28 DIAGNOSIS — I129 Hypertensive chronic kidney disease with stage 1 through stage 4 chronic kidney disease, or unspecified chronic kidney disease: Secondary | ICD-10-CM | POA: Diagnosis present

## 2012-01-28 DIAGNOSIS — E039 Hypothyroidism, unspecified: Secondary | ICD-10-CM | POA: Diagnosis present

## 2012-01-28 DIAGNOSIS — R5381 Other malaise: Secondary | ICD-10-CM | POA: Diagnosis present

## 2012-01-28 DIAGNOSIS — C189 Malignant neoplasm of colon, unspecified: Secondary | ICD-10-CM | POA: Insufficient documentation

## 2012-01-28 DIAGNOSIS — Z85038 Personal history of other malignant neoplasm of large intestine: Secondary | ICD-10-CM

## 2012-01-28 DIAGNOSIS — Z86718 Personal history of other venous thrombosis and embolism: Secondary | ICD-10-CM

## 2012-01-28 DIAGNOSIS — K635 Polyp of colon: Secondary | ICD-10-CM | POA: Diagnosis present

## 2012-01-28 DIAGNOSIS — K921 Melena: Principal | ICD-10-CM | POA: Diagnosis present

## 2012-01-28 DIAGNOSIS — Z860100 Personal history of colon polyps, unspecified: Secondary | ICD-10-CM

## 2012-01-28 DIAGNOSIS — K859 Acute pancreatitis without necrosis or infection, unspecified: Secondary | ICD-10-CM | POA: Diagnosis present

## 2012-01-28 DIAGNOSIS — R42 Dizziness and giddiness: Secondary | ICD-10-CM | POA: Diagnosis present

## 2012-01-28 DIAGNOSIS — D62 Acute posthemorrhagic anemia: Secondary | ICD-10-CM | POA: Diagnosis present

## 2012-01-28 DIAGNOSIS — Z800 Family history of malignant neoplasm of digestive organs: Secondary | ICD-10-CM

## 2012-01-28 DIAGNOSIS — N400 Enlarged prostate without lower urinary tract symptoms: Secondary | ICD-10-CM | POA: Diagnosis present

## 2012-01-28 DIAGNOSIS — N183 Chronic kidney disease, stage 3 unspecified: Secondary | ICD-10-CM | POA: Diagnosis present

## 2012-01-28 DIAGNOSIS — K449 Diaphragmatic hernia without obstruction or gangrene: Secondary | ICD-10-CM | POA: Diagnosis present

## 2012-01-28 DIAGNOSIS — K861 Other chronic pancreatitis: Secondary | ICD-10-CM | POA: Diagnosis present

## 2012-01-28 DIAGNOSIS — Z87891 Personal history of nicotine dependence: Secondary | ICD-10-CM

## 2012-01-28 DIAGNOSIS — D631 Anemia in chronic kidney disease: Secondary | ICD-10-CM | POA: Diagnosis present

## 2012-01-28 DIAGNOSIS — E785 Hyperlipidemia, unspecified: Secondary | ICD-10-CM | POA: Diagnosis present

## 2012-01-28 DIAGNOSIS — K644 Residual hemorrhoidal skin tags: Secondary | ICD-10-CM | POA: Diagnosis present

## 2012-01-28 DIAGNOSIS — Z7901 Long term (current) use of anticoagulants: Secondary | ICD-10-CM

## 2012-01-28 DIAGNOSIS — Z9089 Acquired absence of other organs: Secondary | ICD-10-CM

## 2012-01-28 LAB — URINALYSIS, ROUTINE W REFLEX MICROSCOPIC
Bilirubin Urine: NEGATIVE
Leukocytes, UA: NEGATIVE
Nitrite: NEGATIVE
Specific Gravity, Urine: 1.02 (ref 1.005–1.030)
Urobilinogen, UA: 0.2 mg/dL (ref 0.0–1.0)
pH: 6 (ref 5.0–8.0)

## 2012-01-28 LAB — COMPREHENSIVE METABOLIC PANEL
ALT: 52 U/L (ref 0–53)
Alkaline Phosphatase: 58 U/L (ref 39–117)
BUN: 31 mg/dL — ABNORMAL HIGH (ref 6–23)
Chloride: 103 mEq/L (ref 96–112)
GFR calc Af Amer: 39 mL/min — ABNORMAL LOW (ref >90)
Glucose, Bld: 103 mg/dL — ABNORMAL HIGH (ref 70–99)
Potassium: 4.6 mEq/L (ref 3.5–5.1)
Sodium: 139 mEq/L (ref 135–145)
Total Bilirubin: 0.3 mg/dL (ref 0.3–1.2)
Total Protein: 9.1 g/dL — ABNORMAL HIGH (ref 6.0–8.3)

## 2012-01-28 LAB — CBC WITH DIFFERENTIAL/PLATELET
Eosinophils Absolute: 0.1 10*3/uL (ref 0.0–0.7)
Hemoglobin: 12.7 g/dL — ABNORMAL LOW (ref 13.0–17.0)
Lymphocytes Relative: 25 % (ref 12–46)
Lymphs Abs: 2 10*3/uL (ref 0.7–4.0)
MCH: 24.1 pg — ABNORMAL LOW (ref 26.0–34.0)
Monocytes Relative: 5 % (ref 3–12)
Neutro Abs: 5.6 10*3/uL (ref 1.7–7.7)
Neutrophils Relative %: 69 % (ref 43–77)
Platelets: 292 10*3/uL (ref 150–400)
RBC: 5.27 MIL/uL (ref 4.22–5.81)
WBC: 8.1 10*3/uL (ref 4.0–10.5)

## 2012-01-28 NOTE — ED Notes (Signed)
Patient complaining of intermittent black, watery stools the past three to four days; patient noticed bright red blood on tissue when he wiped tonight.  Patient also complaining of abdominal pain (mid lower quadrant).  Denies chest pain, shortness of breath, and vomiting.  Reports nausea.

## 2012-01-29 ENCOUNTER — Inpatient Hospital Stay (HOSPITAL_COMMUNITY)
Admission: EM | Admit: 2012-01-29 | Discharge: 2012-02-01 | DRG: 378 | Disposition: A | Discharge status: Home / Self Care | Payer: Non-veteran care | Attending: Internal Medicine | Admitting: Internal Medicine

## 2012-01-29 ENCOUNTER — Encounter (HOSPITAL_COMMUNITY): Payer: Self-pay | Admitting: Internal Medicine

## 2012-01-29 DIAGNOSIS — D649 Anemia, unspecified: Secondary | ICD-10-CM

## 2012-01-29 DIAGNOSIS — E785 Hyperlipidemia, unspecified: Secondary | ICD-10-CM

## 2012-01-29 DIAGNOSIS — I10 Essential (primary) hypertension: Secondary | ICD-10-CM

## 2012-01-29 DIAGNOSIS — C189 Malignant neoplasm of colon, unspecified: Secondary | ICD-10-CM

## 2012-01-29 DIAGNOSIS — Z7901 Long term (current) use of anticoagulants: Secondary | ICD-10-CM

## 2012-01-29 DIAGNOSIS — K859 Acute pancreatitis without necrosis or infection, unspecified: Secondary | ICD-10-CM

## 2012-01-29 DIAGNOSIS — N1832 Chronic kidney disease, stage 3b: Secondary | ICD-10-CM | POA: Diagnosis present

## 2012-01-29 DIAGNOSIS — K635 Polyp of colon: Secondary | ICD-10-CM

## 2012-01-29 DIAGNOSIS — N183 Chronic kidney disease, stage 3 unspecified: Secondary | ICD-10-CM

## 2012-01-29 DIAGNOSIS — K921 Melena: Secondary | ICD-10-CM

## 2012-01-29 DIAGNOSIS — K922 Gastrointestinal hemorrhage, unspecified: Secondary | ICD-10-CM | POA: Diagnosis present

## 2012-01-29 HISTORY — DX: Acute pancreatitis without necrosis or infection, unspecified: K85.90

## 2012-01-29 HISTORY — DX: Essential (primary) hypertension: I10

## 2012-01-29 HISTORY — DX: Malignant neoplasm of colon, unspecified: C18.9

## 2012-01-29 HISTORY — DX: Hyperlipidemia, unspecified: E78.5

## 2012-01-29 HISTORY — DX: Polyp of colon: K63.5

## 2012-01-29 LAB — CBC
Hemoglobin: 11.8 g/dL — ABNORMAL LOW (ref 13.0–17.0)
Hemoglobin: 10.9 g/dL — ABNORMAL LOW (ref 13.0–17.0)
MCH: 24 pg — ABNORMAL LOW (ref 26.0–34.0)
MCHC: 32.5 g/dL (ref 30.0–36.0)
MCHC: 32.5 g/dL (ref 30.0–36.0)
MCV: 73.8 fL — ABNORMAL LOW (ref 78.0–100.0)
Platelets: 256 10*3/uL (ref 150–400)
RBC: 4.92 MIL/uL (ref 4.22–5.81)
RBC: 4.56 MIL/uL (ref 4.22–5.81)

## 2012-01-29 LAB — OCCULT BLOOD, POC DEVICE: Fecal Occult Bld: NEGATIVE

## 2012-01-29 LAB — ABO/RH: ABO/RH(D): B POS

## 2012-01-29 MED ORDER — SODIUM CHLORIDE 0.9 % IV SOLN
INTRAVENOUS | Status: AC
  Administered 2012-01-29 – 2012-01-30 (×2): via INTRAVENOUS

## 2012-01-29 MED ORDER — HYDROCHLOROTHIAZIDE 25 MG PO TABS
25.0000 mg | ORAL_TABLET | Freq: Every day | ORAL | Status: DC
  Administered 2012-01-29 – 2012-01-31 (×3): 25 mg via ORAL
  Filled 2012-01-29 (×4): qty 1

## 2012-01-29 MED ORDER — ACETAMINOPHEN 325 MG PO TABS
650.0000 mg | ORAL_TABLET | Freq: Four times a day (QID) | ORAL | Status: DC | PRN
  Administered 2012-01-29: 650 mg via ORAL
  Filled 2012-01-29: qty 2

## 2012-01-29 MED ORDER — HYDROCODONE-ACETAMINOPHEN 5-325 MG PO TABS
0.5000 | ORAL_TABLET | Freq: Four times a day (QID) | ORAL | Status: DC | PRN
  Administered 2012-01-29 – 2012-01-31 (×2): 1 via ORAL
  Filled 2012-01-29 (×2): qty 1

## 2012-01-29 MED ORDER — PANTOPRAZOLE SODIUM 40 MG IV SOLR
40.0000 mg | Freq: Two times a day (BID) | INTRAVENOUS | Status: DC
  Administered 2012-01-29: 40 mg via INTRAVENOUS
  Filled 2012-01-29 (×4): qty 40

## 2012-01-29 MED ORDER — LEVOTHYROXINE SODIUM 50 MCG PO TABS
50.0000 ug | ORAL_TABLET | Freq: Every day | ORAL | Status: DC
  Administered 2012-01-29 – 2012-01-31 (×3): 50 ug via ORAL
  Filled 2012-01-29 (×4): qty 1

## 2012-01-29 MED ORDER — ONDANSETRON HCL 4 MG/2ML IJ SOLN: 4.0000 mg | Freq: Four times a day (QID) | INTRAMUSCULAR | Status: DC | PRN

## 2012-01-29 MED ORDER — ONDANSETRON HCL 4 MG PO TABS: 4.0000 mg | ORAL_TABLET | Freq: Four times a day (QID) | ORAL | Status: DC | PRN

## 2012-01-29 MED ORDER — PANTOPRAZOLE SODIUM 40 MG IV SOLR
40.0000 mg | Freq: Once | INTRAVENOUS | Status: AC
  Administered 2012-01-29: 40 mg via INTRAVENOUS
  Filled 2012-01-29: qty 40

## 2012-01-29 MED ORDER — FINASTERIDE 5 MG PO TABS
5.0000 mg | ORAL_TABLET | Freq: Every day | ORAL | Status: DC
  Administered 2012-01-29 – 2012-02-01 (×4): 5 mg via ORAL
  Filled 2012-01-29 (×4): qty 1

## 2012-01-29 MED ORDER — ACETAMINOPHEN 650 MG RE SUPP: 650.0000 mg | Freq: Four times a day (QID) | RECTAL | Status: DC | PRN

## 2012-01-29 NOTE — Progress Notes (Signed)
01/29/12  1650  C/O's of cramping in legs-encouraged pt. to move legs some;states that he has some at home also; paged Dr. Betti Cruz.  Leandrew Koyanagi Deagan Sevin,RN

## 2012-01-29 NOTE — Progress Notes (Signed)
01/29/12 1700 Dr. Betti Cruz stated no change in tx re: cramps-can give Tylenol if needed.   Guy Koyanagi Quanda Pavlicek,RN

## 2012-01-29 NOTE — H&P (Addendum)
Patient's PCP: Dr. Elesa Massed, [facility]  Chief Complaint: Black stools  History of Present Illness: [name] is a 61 y.o. African American male with history of colon polyps, hypertension, hyperlipidemia, chronic pancreatitis, portal vein thrombosis on chronic anticoagulation, who presents with the above complaints.  Patient reports that over the last 2-3 days he has been feeling dizzy and has noted watery black stools with blood clots when he wipes.  He had called his doctor at the [place] had instructed him to go to the local emergency department as a result he presented to the ER for further evaluation.  Denies any recent fevers, chills, nausea, vomiting, chest pain, shortness of breath, headaches or vision changes.  He does complain of suprapubic abdominal pain which is new.  Of note patient had colonoscopy in 2000 and had large polyps removed, he had colonoscopy approximately 3 years ago which was normal per patient.  Past Medical History  Diagnosis Date  . Colon cancer   . Pancreatitis   . Hypertension   . Hyperlipidemia   . Colon polyps    Past Surgical History  Procedure Date  . Cholecystectomy   . Neck surgery   . Thyroid surgery    Family History  Problem Relation Age of Onset  . Colon cancer Mother   . Colon cancer Sister   . Colon cancer Brother    History   Social History  . Marital Status: Married    Spouse Name: N/A    Number of Children: N/A  . Years of Education: N/A   Occupational History  . Not on file.   Social History Main Topics  . Smoking status: Former Smoker    Quit date: 01/28/1982  . Smokeless tobacco: Not on file  . Alcohol Use: No  . Drug Use: No  . Sexually Active:    Other Topics Concern  . Not on file   Social History Narrative  . No narrative on file   Allergies: Review of patient's allergies indicates no known allergies.  Meds: Scheduled Meds:   . pantoprazole (PROTONIX) IV  40 mg Intravenous Once  . pantoprazole (PROTONIX) IV   40 mg Intravenous Q12H   Continuous Infusions:   . sodium chloride     PRN Meds:.  Review of Systems: All systems reviewed with the patient and positive as per history of present illness, otherwise all other systems are negative.  Physical Exam: Blood pressure 138/89, pulse 110, temperature 98.4 F (36.9 C), temperature source Oral, resp. rate 12, SpO2 100.00%. General: Awake, Oriented x3, No acute distress. HEENT: EOMI, Moist mucous membranes Neck: Supple CV: S1 and S2 Lungs: Clear to ascultation bilaterally Abdomen: Soft, Nontender, Nondistended, +bowel sounds. Ext: Good pulses. Trace edema. No clubbing or cyanosis noted. Neuro: Cranial Nerves II-XII grossly intact. Has 5/5 motor strength in upper and lower extremities.   Lab results:  [facility] 01/28/12 2303  NA 139  K 4.6  CL 103  CO2 26  GLUCOSE 103*  BUN 31*  CREATININE 2.02*  CALCIUM 9.9  MG --  PHOS --    Basename 01/28/12 2303  AST 30  ALT 52  ALKPHOS 58  BILITOT 0.3  PROT 9.1*  ALBUMIN 4.4   No results found for this basename: LIPASE:2,AMYLASE:2 in the last 72 hours  Basename 01/29/12 0412 01/28/12 2303  WBC 8.1 8.1  NEUTROABS -- 5.6  HGB 11.8* 12.7*  HCT 36.3* 38.9*  MCV 73.8* 73.8*  PLT 279 292   No results found for this  basename: CKTOTAL:3,CKMB:3,CKMBINDEX:3,TROPONINI:3 in the last 72 hours No components found with this basename: POCBNP:3 No results found for this basename: DDIMER in the last 72 hours No results found for this basename: HGBA1C:2 in the last 72 hours No results found for this basename: CHOL:2,HDL:2,LDLCALC:2,TRIG:2,CHOLHDL:2,LDLDIRECT:2 in the last 72 hours No results found for this basename: TSH,T4TOTAL,FREET3,T3FREE,THYROIDAB in the last 72 hours No results found for this basename: VITAMINB12:2,FOLATE:2,FERRITIN:2,TIBC:2,IRON:2,RETICCTPCT:2 in the last 72 hours Imaging results:  No results found. Other results: EKG: EKG shows sinus rhythm..  Assessment & Plan by  Problem: Dark stools/presumed GI bleed Source unclear.  Hemoglobin stable.  Continue trend CBC.  Continue pantoprazole. Appreciate GI evaluation, no plans for urgent colonoscopy and EGD at this time.  Stool guaiac on admission was negative.  Anemia Presumed to be from acute blood loss from GI bleed, may also be due to chronic kidney disease.  Hemoglobin stable.  No need for blood transfusion at this time.  Renal insuffiencey likely due to CKD stage III Baseline creatinine 1.35 in 2010, no creatinine for comparison since then.  Suspect creatinine of 2.0 is likely progression of renal disease.  Abdominal pain Etiology unclear.  Continue to monitor.  If patient has further symptoms consider further workup/imaging at that time.  Liver function tests normal.  Benign on exam.  Maybe due to chronic pancreatic pain patient has, though the patient indicates this pain is different.  Chronic pancreatitis Stable.  Continue to monitor.  Hyperlipidemia Hold statin for now.  Hypothyroidism Continue levothyroxine.  History of portal vein thrombosis on chronic anticoagulation Hold Coumadin given concern for GI bleed.  Benign prostatic hypertrophy Continue finasteride.  Hypertension Stable.  Continue hydrochlorothiazide.  Prophylaxis SCDs, no heparin products given concern for bleed.  Disposition Pending GI evaluation/workup.  Time spent on admission, talking to the patient, and coordinating care was: 60 mins.  Tahsin Benyo A, MD 01/29/2012, 8:30 AM

## 2012-01-29 NOTE — Consult Note (Signed)
[facility] Consult Note  Referring Provider: No ref. provider found Primary Care Physician:  Pcp Not In System Primary Gastroenterologist:  Dr.  Antony Contras Complaint: Black stools HPI: [name] is an 61 y.o. black male  who presents with a two-day history of black tarry stools without any abdominal pain. He hemoglobin of 12.7 BUN of 31 with creatinine of 2.0 to fecal occult blood was negative. He has a very strong family history of colon cancer in 3 first-degree relatives. His last colonoscopy was in 2009. He gives a history of colon polyps possibly cancerous polyps but never required surgery. This was in 2000. He has not been taking Pepto-Bismol. He has never had an upper endoscopy.  Past Medical History  Diagnosis Date  . Colon cancer   . Pancreatitis   . Hypertension   . Hyperlipidemia   . Colon polyps     Past Surgical History  Procedure Date  . Cholecystectomy   . Neck surgery   . Thyroid surgery     Medications Prior to Admission  Medication Sig Dispense Refill  . benzoyl peroxide 10 % gel Apply 1 application topically daily.      . clindamycin (CLEOCIN) 75 MG/5ML solution Take by mouth 2 (two) times daily. Apply to shoulders, chest, stomach, and back      . finasteride (PROSCAR) 5 MG tablet Take 5 mg by mouth daily.      . hydrochlorothiazide (HYDRODIURIL) 25 MG tablet Take 25 mg by mouth daily.      Marland Kitchen levothyroxine (SYNTHROID, LEVOTHROID) 50 MCG tablet Take 50 mcg by mouth daily.      Marland Kitchen omeprazole (PRILOSEC) 20 MG capsule Take 20 mg by mouth daily.      . ondansetron (ZOFRAN) 8 MG tablet Take by mouth every 8 (eight) hours as needed. For nausea      . simvastatin (ZOCOR) 80 MG tablet Take 80 mg by mouth at bedtime.      . triamcinolone cream (KENALOG) 0.1 % Apply 1 application topically 2 (two) times daily.      Marland Kitchen warfarin (COUMADIN) 5 MG tablet Take 5-7.5 mg by mouth every evening. 1 tablet every day except Thursdays. Thursdays 1 1/2 tablets         Allergies: No Known Allergies  Family History  Problem Relation Age of Onset  . Colon cancer Mother   . Colon cancer Sister   . Colon cancer Brother     Social History:  reports that he quit smoking about 30 years ago. He does not have any smokeless tobacco history on file. He reports that he does not drink alcohol or use illicit drugs.  Review of Systems: negative except as above   Blood pressure 160/89, pulse 97, temperature 97.7 F (36.5 C), temperature source Oral, resp. rate 18, height 5\' 11"  (1.803 m), weight 70.943 kg (156 lb 6.4 oz), SpO2 100.00%. Head: Normocephalic, without obvious abnormality, atraumatic Neck: no adenopathy, no carotid bruit, no JVD, supple, symmetrical, trachea midline and thyroid not enlarged, symmetric, no tenderness/mass/nodules Resp: clear to auscultation bilaterally Cardio: regular rate and rhythm, S1, S2 normal, no murmur, click, rub or gallop GI: Abdomen soft nondistended with normoactive bowel sounds. No hepatomegaly masses or guarding Extremities: extremities normal, atraumatic, no cyanosis or edema  Results for orders placed during the hospital encounter of 01/29/12 (from the past 48 hour(s))  CBC WITH DIFFERENTIAL     Status: Abnormal   Collection Time   01/28/12 11:03 PM      Component  Value Range Comment   WBC 8.1  4.0 - 10.5 K/uL    RBC 5.27  4.22 - 5.81 MIL/uL    Hemoglobin 12.7 (*) 13.0 - 17.0 g/dL    HCT 16.1 (*) 09.6 - 52.0 %    MCV 73.8 (*) 78.0 - 100.0 fL    MCH 24.1 (*) 26.0 - 34.0 pg    MCHC 32.6  30.0 - 36.0 g/dL    RDW 04.5  40.9 - 81.1 %    Platelets 292  150 - 400 K/uL    Neutrophils Relative 69  43 - 77 %    Neutro Abs 5.6  1.7 - 7.7 K/uL    Lymphocytes Relative 25  12 - 46 %    Lymphs Abs 2.0  0.7 - 4.0 K/uL    Monocytes Relative 5  3 - 12 %    Monocytes Absolute 0.4  0.1 - 1.0 K/uL    Eosinophils Relative 1  0 - 5 %    Eosinophils Absolute 0.1  0.0 - 0.7 K/uL    Basophils Relative 0  0 - 1 %    Basophils  Absolute 0.0  0.0 - 0.1 K/uL   COMPREHENSIVE METABOLIC PANEL     Status: Abnormal   Collection Time   01/28/12 11:03 PM      Component Value Range Comment   Sodium 139  135 - 145 mEq/L    Potassium 4.6  3.5 - 5.1 mEq/L    Chloride 103  96 - 112 mEq/L    CO2 26  19 - 32 mEq/L    Glucose, Bld 103 (*) 70 - 99 mg/dL    BUN 31 (*) 6 - 23 mg/dL    Creatinine, Ser 9.14 (*) 0.50 - 1.35 mg/dL    Calcium 9.9  8.4 - 78.2 mg/dL    Total Protein 9.1 (*) 6.0 - 8.3 g/dL    Albumin 4.4  3.5 - 5.2 g/dL    AST 30  0 - 37 U/L    ALT 52  0 - 53 U/L    Alkaline Phosphatase 58  39 - 117 U/L    Total Bilirubin 0.3  0.3 - 1.2 mg/dL    GFR calc non Af Amer 34 (*) >90 mL/min    GFR calc Af Amer 39 (*) >90 mL/min   PROTIME-INR     Status: Abnormal   Collection Time   01/28/12 11:03 PM      Component Value Range Comment   Prothrombin Time 33.1 (*) 11.6 - 15.2 seconds    INR 3.18 (*) 0.00 - 1.49   URINALYSIS, ROUTINE W REFLEX MICROSCOPIC     Status: Normal   Collection Time   01/28/12 11:12 PM      Component Value Range Comment   Color, Urine YELLOW  YELLOW    APPearance CLEAR  CLEAR    Specific Gravity, Urine 1.020  1.005 - 1.030    pH 6.0  5.0 - 8.0    Glucose, UA NEGATIVE  NEGATIVE mg/dL    Hgb urine dipstick NEGATIVE  NEGATIVE    Bilirubin Urine NEGATIVE  NEGATIVE    Ketones, ur NEGATIVE  NEGATIVE mg/dL    Protein, ur NEGATIVE  NEGATIVE mg/dL    Urobilinogen, UA 0.2  0.0 - 1.0 mg/dL    Nitrite NEGATIVE  NEGATIVE    Leukocytes, UA NEGATIVE  NEGATIVE MICROSCOPIC NOT DONE ON URINES WITH NEGATIVE PROTEIN, BLOOD, LEUKOCYTES, NITRITE, OR GLUCOSE <1000 mg/dL.  CBC  Status: Abnormal   Collection Time   01/29/12  4:12 AM      Component Value Range Comment   WBC 8.1  4.0 - 10.5 K/uL    RBC 4.92  4.22 - 5.81 MIL/uL    Hemoglobin 11.8 (*) 13.0 - 17.0 g/dL    HCT 40.9 (*) 81.1 - 52.0 %    MCV 73.8 (*) 78.0 - 100.0 fL    MCH 24.0 (*) 26.0 - 34.0 pg    MCHC 32.5  30.0 - 36.0 g/dL    RDW 91.4  78.2 -  95.6 %    Platelets 279  150 - 400 K/uL   TYPE AND SCREEN     Status: Normal   Collection Time   01/29/12  4:20 AM      Component Value Range Comment   ABO/RH(D) B POS      Antibody Screen NEG      Sample Expiration 02/01/2012     ABO/RH     Status: Normal   Collection Time   01/29/12  4:20 AM      Component Value Range Comment   ABO/RH(D) B POS     OCCULT BLOOD, POC DEVICE     Status: Normal   Collection Time   01/29/12  5:51 AM      Component Value Range Comment   Fecal Occult Bld NEGATIVE      No results found.  Assessment: Description consistent with GI bleeding with black tarry stools with no confirmation of heme positivity yet and normal hemoglobin. Plan:  Monitor additional stools and guaiac as well as follow hemoglobin. Due to his strong family history he is not far from being due for colonoscopy in any case. Do not see any indication for urgent colonoscopy or EGD yet. Shandee Jergens C 01/29/2012, 11:19 AM

## 2012-01-29 NOTE — ED Notes (Signed)
Pts plan of care discussed.  He understands he is NPO at this time.

## 2012-01-29 NOTE — ED Provider Notes (Signed)
History     CSN: [id number]  Arrival date & time 01/28/12  2247   First MD Initiated Contact with Patient 01/29/12 0408      Chief Complaint  Patient presents with  . Rectal Bleeding    (Consider location/radiation/quality/duration/timing/severity/associated sxs/prior treatment) HPI Comments: Pt with hx of portal vein thrombosis on coumadin, and colon ca with last colonoscopy 3 yrs ago comes in with cc of melenatic stools x 2 days. Pt has had about 5 episodes of dark tarry stools. No hx of same before. Denies hematemesis. Pt is feeling slightly lightheaded, denies any chest pain, sob.  Patient is a 61 y.o. male presenting with hematochezia. The history is provided by the patient.  Rectal Bleeding  Associated symptoms include diarrhea. Pertinent negatives include no fever, no chest pain, no headaches and no coughing.    Past Medical History  Diagnosis Date  . Colon cancer   . Pancreatitis   . Hypertension   . Hyperlipidemia   . Colon polyps     Past Surgical History  Procedure Date  . Cholecystectomy   . Neck surgery   . Thyroid surgery     Family History  Problem Relation Age of Onset  . Colon cancer Mother   . Colon cancer Sister   . Colon cancer Brother     History  Substance Use Topics  . Smoking status: Former Smoker    Quit date: 01/28/1982  . Smokeless tobacco: Not on file  . Alcohol Use: No      Review of Systems  Constitutional: Negative for fever, chills and activity change.  HENT: Negative for neck pain.   Eyes: Negative for visual disturbance.  Respiratory: Negative for cough, chest tightness and shortness of breath.   Cardiovascular: Negative for chest pain.  Gastrointestinal: Positive for diarrhea, blood in stool and hematochezia. Negative for abdominal distention.  Genitourinary: Negative for dysuria, enuresis and difficulty urinating.  Musculoskeletal: Negative for arthralgias.  Neurological: Positive for dizziness. Negative for  light-headedness and headaches.  Psychiatric/Behavioral: Negative for confusion.    Allergies  Review of patient's allergies indicates no known allergies.  Home Medications   Current Outpatient Rx  Name Route Sig Dispense Refill  . BENZOYL PEROXIDE 10 % EX GEL Topical Apply 1 application topically daily.    Marland Kitchen CLINDAMYCIN PALMITATE HCL 75 MG/5ML PO SOLR Oral Take by mouth 2 (two) times daily. Apply to shoulders, chest, stomach, and back    . FINASTERIDE 5 MG PO TABS Oral Take 5 mg by mouth daily.    Marland Kitchen HYDROCHLOROTHIAZIDE 25 MG PO TABS Oral Take 25 mg by mouth daily.    Marland Kitchen LEVOTHYROXINE SODIUM 50 MCG PO TABS Oral Take 50 mcg by mouth daily.    Marland Kitchen OMEPRAZOLE 20 MG PO CPDR Oral Take 20 mg by mouth daily.    Marland Kitchen ONDANSETRON HCL 8 MG PO TABS Oral Take by mouth every 8 (eight) hours as needed. For nausea    . SIMVASTATIN 80 MG PO TABS Oral Take 80 mg by mouth at bedtime.    . TRIAMCINOLONE ACETONIDE 0.1 % EX CREA Topical Apply 1 application topically 2 (two) times daily.    . WARFARIN SODIUM 5 MG PO TABS Oral Take 5-7.5 mg by mouth every evening. 1 tablet every day except Thursdays. Thursdays 1 1/2 tablets      BP 144/89  Pulse 98  Temp 98.4 F (36.9 C) (Oral)  Resp 17  SpO2 99%  Physical Exam  Nursing note and vitals reviewed. Constitutional:  He is oriented to person, place, and time. He appears well-developed.  HENT:  Head: Normocephalic and atraumatic.  Eyes: Conjunctivae and EOM are normal. Pupils are equal, round, and reactive to light.  Neck: Normal range of motion. Neck supple.  Cardiovascular: Normal rate, regular rhythm and normal heart sounds.   Pulmonary/Chest: Effort normal and breath sounds normal. No respiratory distress. He has no wheezes.  Abdominal: Soft. Bowel sounds are normal. He exhibits no distension. There is no tenderness. There is no rebound and no guarding.       No stool in the rectal vault noted during DRE.  Neurological: He is alert and oriented to person,  place, and time.  Skin: Skin is warm.    ED Course  Procedures (including critical care time)  Labs Reviewed  CBC WITH DIFFERENTIAL - Abnormal; Notable for the following:    Hemoglobin 12.7 (*)     HCT 38.9 (*)     MCV 73.8 (*)     MCH 24.1 (*)     All other components within normal limits  COMPREHENSIVE METABOLIC PANEL - Abnormal; Notable for the following:    Glucose, Bld 103 (*)     BUN 31 (*)     Creatinine, Ser 2.02 (*)     Total Protein 9.1 (*)     GFR calc non Af Amer 34 (*)     GFR calc Af Amer 39 (*)     All other components within normal limits  PROTIME-INR - Abnormal; Notable for the following:    Prothrombin Time 33.1 (*)     INR 3.18 (*)     All other components within normal limits  CBC - Abnormal; Notable for the following:    Hemoglobin 11.8 (*)     HCT 36.3 (*)     MCV 73.8 (*)     MCH 24.0 (*)     All other components within normal limits  URINALYSIS, ROUTINE W REFLEX MICROSCOPIC  TYPE AND SCREEN  ABO/RH  OCCULT BLOOD, POC DEVICE  CBC   No results found.   1. Lower GI bleeding   2. Melena       MDM  DDx includes: Esophagitis Mallory Weiss tear [place]  Variceal bleeding PUD/Gastritis/ulcers Diverticular bleed Colon cancer Rectal bleed Internal hemorrhoids External hemorrhoids  Pt with hx of colon ca, on coumadin comes in with melena. Hemodynamics are stable. Concerns primarily for LGIB with this presentation, however, UGIB is possible as well and will be needed to be ruled out. Will give a bolus of protonix and reassess. No NG lavage - as Hb is stable, and it wouldn't change the dispassion at this point. Pt fif have external hemorrhoids - non bleeding.          Derwood Kaplan, MD 01/29/12 [phone]

## 2012-01-29 NOTE — Progress Notes (Signed)
01/29/12  0945  Pt. admitted to [id number] via stretcher by ER staff-no problems noted, alert and oriented x 3; c/o's abd. pain-mid lower.  From home alone and plan to return; tele.placed on pt..  No skin problems noted.  Leandrew Koyanagi Riniyah Speich,RN

## 2012-01-29 NOTE — ED Notes (Signed)
Patient states that he has been having black tarry stools for several days now and tonight noticed bright red blood on the toilet paper.  States he feels dizzy at times.

## 2012-01-29 NOTE — Plan of Care (Signed)
Problem: Phase I Progression Outcomes Goal: OOB as tolerated unless otherwise ordered Outcome: Progressing Generalized weakness.

## 2012-01-29 NOTE — Care Management Note (Unsigned)
    Page 1 of 1   01/29/2012     3:21:31 PM   CARE MANAGEMENT NOTE 01/29/2012  Patient:  [name], [name]   Account Number:  [id number]  Date Initiated:  01/29/2012  Documentation initiated by:  Letha Cape  Subjective/Objective Assessment:   dx gib  admit- lives alone. pta independent.     Action/Plan:   Anticipated DC Date:  02/01/2012   Anticipated DC Plan:  HOME/SELF CARE      DC Planning Services  CM consult      Choice offered to / List presented to:             Status of service:  In process, will continue to follow Medicare Important Message given?   (If response is "NO", the following Medicare IM given date fields will be blank) Date Medicare IM given:   Date Additional Medicare IM given:    Discharge Disposition:    Per UR Regulation:  Reviewed for med. necessity/level of care/duration of stay  If discussed at Long Length of Stay Meetings, dates discussed:    Comments:  01/29/12 15:14 Letha Cape RN, BSN [phone] patient lives alone, pta independent. [facility] is [facility].  Patient gets his [facility] from [facility] and he states  most  his meds at home are filled and he does not need any meds right now, but if he is started on something new then he would need to get that filled.  NCM will continue to follow for dc needs.

## 2012-01-30 ENCOUNTER — Inpatient Hospital Stay (HOSPITAL_COMMUNITY): Payer: Non-veteran care

## 2012-01-30 DIAGNOSIS — I10 Essential (primary) hypertension: Secondary | ICD-10-CM

## 2012-01-30 LAB — CBC
HCT: 35.6 % — ABNORMAL LOW (ref 39.0–52.0)
Hemoglobin: 11.5 g/dL — ABNORMAL LOW (ref 13.0–17.0)
MCH: 23.9 pg — ABNORMAL LOW (ref 26.0–34.0)
MCHC: 32.3 g/dL (ref 30.0–36.0)
Platelets: 236 10*3/uL (ref 150–400)
Platelets: 268 10*3/uL (ref 150–400)
RBC: 4.82 MIL/uL (ref 4.22–5.81)
RDW: 15.1 % (ref 11.5–15.5)
WBC: 7.1 10*3/uL (ref 4.0–10.5)
WBC: 6.6 10*3/uL (ref 4.0–10.5)

## 2012-01-30 LAB — BASIC METABOLIC PANEL
Calcium: 9.3 mg/dL (ref 8.4–10.5)
GFR calc non Af Amer: 43 mL/min — ABNORMAL LOW (ref >90)
Glucose, Bld: 100 mg/dL — ABNORMAL HIGH (ref 70–99)
Potassium: 4.9 mEq/L (ref 3.5–5.1)
Sodium: 137 mEq/L (ref 135–145)

## 2012-01-30 LAB — OCCULT BLOOD X 1 CARD TO LAB, STOOL: Fecal Occult Bld: NEGATIVE

## 2012-01-30 LAB — PROTIME-INR
INR: 1.65 — ABNORMAL HIGH (ref 0.00–1.49)
Prothrombin Time: 19.8 seconds — ABNORMAL HIGH (ref 11.6–15.2)

## 2012-01-30 MED ORDER — PEG 3350-KCL-NA BICARB-NACL 420 G PO SOLR
4000.0000 mL | Freq: Once | ORAL | Status: DC
  Filled 2012-01-30: qty 4000

## 2012-01-30 MED ORDER — PEG 3350-KCL-NA BICARB-NACL 420 G PO SOLR
4000.0000 mL | Freq: Once | ORAL | Status: AC
  Administered 2012-01-31: 4000 mL via ORAL
  Filled 2012-01-30: qty 4000

## 2012-01-30 MED ORDER — BISACODYL 5 MG PO TBEC
10.0000 mg | DELAYED_RELEASE_TABLET | Freq: Once | ORAL | Status: AC
  Administered 2012-01-31: 10 mg via ORAL
  Filled 2012-01-30: qty 2

## 2012-01-30 MED ORDER — DOCUSATE SODIUM 100 MG PO CAPS
100.0000 mg | ORAL_CAPSULE | Freq: Two times a day (BID) | ORAL | Status: DC | PRN
Start: 2012-01-30 — End: 2012-02-01
  Administered 2012-01-30: 100 mg via ORAL
  Filled 2012-01-30: qty 1

## 2012-01-30 MED ORDER — PANTOPRAZOLE SODIUM 40 MG PO TBEC: 40.0000 mg | DELAYED_RELEASE_TABLET | Freq: Two times a day (BID) | ORAL | Status: DC

## 2012-01-30 MED ORDER — PANTOPRAZOLE SODIUM 40 MG PO TBEC
40.0000 mg | DELAYED_RELEASE_TABLET | Freq: Two times a day (BID) | ORAL | Status: DC
  Administered 2012-01-30 – 2012-01-31 (×4): 40 mg via ORAL
  Filled 2012-01-30 (×6): qty 1

## 2012-01-30 NOTE — Progress Notes (Signed)
Subjective: Feels a bit weak and dizzy. Still having dark stools.  Objective: Vital signs in last 24 hours: Temp:  [98.5 F (36.9 C)-98.6 F (37 C)] 98.5 F (36.9 C) (08/24 0528) Pulse Rate:  [80-101] 83  (08/24 0528) Resp:  [18-20] 20  (08/24 0528) BP: (116-130)/(78-86) 130/86 mmHg (08/24 0528) SpO2:  [97 %-99 %] 99 % (08/24 0528) Weight:  [71.5 kg (157 lb 10.1 oz)] 71.5 kg (157 lb 10.1 oz) (08/24 0528) Weight change:  Last BM Date: 01/28/12  PE: GEN:  Chronically ill-appearing but is in no acute distress ABD:  Soft  Lab Results: CBC    Component Value Date/Time   WBC 7.1 01/30/2012 0530   RBC 4.81 01/30/2012 0530   HGB 11.5* 01/30/2012 0530   HCT 35.6* 01/30/2012 0530   PLT 236 01/30/2012 0530   MCV 74.0* 01/30/2012 0530   MCH 23.9* 01/30/2012 0530   MCHC 32.3 01/30/2012 0530   RDW 15.1 01/30/2012 0530   LYMPHSABS 2.0 01/28/2012 2303   MONOABS 0.4 01/28/2012 2303   EOSABS 0.1 01/28/2012 2303   BASOSABS 0.0 01/28/2012 2303   Hemoccults negative x 2  Assessment:  1.  Dark stools with symptomatic weakness. 2.  Mild anemia. 3.  Family history colon cancer. 4.  Chronic anticoagulation from portal vein thrombosis. 5.  Chronic pancreatitis.  Plan:  1.  Continue antisecretory (PPI) therapy. 2.  Endoscopy and colonoscopy Monday for further evaluation. 3.  Thank you for the consult.   [name] 01/30/2012, 12:58 PM

## 2012-01-30 NOTE — Evaluation (Signed)
Physical Therapy Evaluation Patient Details Name: [name] MRN: [id number] DOB: [birth year] Today's Date: 01/30/2012 Time: 4098-1191 PT Time Calculation (min): 26 min  PT Assessment / Plan / Recommendation Clinical Impression  Patient is a 61 yo male admitted with GIB who also reports dizziness over several days.  Patient reports dizziness is ongoing - it gets better and worse, but does not go away. Dizziness is not impacting functional mobility - independent with gait with no loss of balance.  Patient is not orthostatic with position changes (124/82 sitting, 132/92 standing).  Tested for central cause - no nystagmus with gaze stability activities.  Performed rolling and [place] - no nystagmus and dizziness remained at 3-4/10. Does not present with signs of BPPV.   Patient does report a recent sinus infection and that his ears feel "full - like they are going to pop".  Will continue to follow acutely for dizziness.    PT Assessment  Patient needs continued PT services    Follow Up Recommendations  No PT follow up    Barriers to Discharge        Equipment Recommendations  None recommended by PT    Recommendations for Other Services     Frequency Min 3X/week    Precautions / Restrictions Precautions Precautions: None Restrictions Weight Bearing Restrictions: No         Mobility  Bed Mobility Bed Mobility: Rolling Right;Rolling Left;Supine to Sit;Sit to Supine Rolling Right: 7: Independent Rolling Left: 7: Independent Supine to Sit: 7: Independent;HOB flat Sit to Supine: 7: Independent;HOB flat Details for Bed Mobility Assistance: No cues or assist needed with bed mobility. Transfers Transfers: Sit to Stand;Stand to Sit Sit to Stand: 7: Independent;With upper extremity assist;With armrests;From chair/3-in-1;From bed Stand to Sit: 7: Independent;With upper extremity assist;With armrests;To bed;To chair/3-in-1 Details for Transfer Assistance: No cues needed.   Reminded patient to stand for a few seconds before he starts to ambulate for safety. Ambulation/Gait Ambulation/Gait Assistance: 7: Independent Ambulation Distance (Feet): 100 Feet Assistive device: None Ambulation/Gait Assistance Details: Good balance.  Decreases speed during turns due to dizziness.  No loss of balance with gait. Gait Pattern: Within Functional Limits    Exercises     PT Diagnosis:  (Dizziness)  PT Problem List: Other (comment) (Ongoing dizziness) PT Treatment Interventions: Stair training;Balance training;Therapeutic activities;Patient/family education   PT Goals Acute Rehab PT Goals PT Goal Formulation: With patient Time For Goal Achievement: 02/01/12 Potential to Achieve Goals: Good Pt will Ambulate: >150 feet;Independently (with no loss of balance and dizziness < 3/10) PT Goal: Ambulate - Progress: Goal set today Pt will Go Up / Down Stairs: 3-5 stairs;Independently;with rail(s) PT Goal: Up/Down Stairs - Progress: Goal set today Additional Goals Additional Goal #1: Patient will score < 20/24 on DGI balance assessment. PT Goal: Additional Goal #1 - Progress: Goal set today  Visit Information  Last PT Received On: 01/30/12 Assistance Needed: +1    Subjective Data  Subjective: "I'm dizzy all the time.  It doesn't go away" Patient Stated Goal: To go home soon   Prior Functioning  Home Living Lives With: Alone Type of Home: Apartment Home Access: Stairs to enter Entrance Stairs-Number of Steps: flight 12-15 Entrance Stairs-Rails: Right;Left;Can reach both Home Layout: One level Home Adaptive Equipment: None Prior Function Level of Independence: Independent Able to Take Stairs?: Yes Driving: Yes Vocation: On disability Communication Communication: No difficulties    Cognition  Overall Cognitive Status: Appears within functional limits for tasks assessed/performed Arousal/Alertness: Awake/alert Orientation  Level: Oriented X4 / Intact Behavior  During Session: [facility] for tasks performed    Extremity/Trunk Assessment Right Upper Extremity Assessment RUE ROM/Strength/Tone: WFL for tasks assessed RUE Sensation: WFL - Light Touch Left Upper Extremity Assessment LUE ROM/Strength/Tone: WFL for tasks assessed LUE Sensation: WFL - Light Touch Right Lower Extremity Assessment RLE ROM/Strength/Tone: WFL for tasks assessed RLE Sensation: WFL - Light Touch Left Lower Extremity Assessment LLE ROM/Strength/Tone: WFL for tasks assessed LLE Sensation: WFL - Light Touch Trunk Assessment Trunk Assessment: Normal;Other exceptions Trunk Exceptions: Neck with decreased ROM - prior surgery   Balance    End of Session PT - End of Session Activity Tolerance: Patient tolerated treatment well Patient left: in chair;with call bell/phone within reach Nurse Communication: Mobility status  GP     Vena Austria 01/30/2012, 5:13 PM Durenda Hurt. Renaldo Fiddler, [facility] Acute Rehab Services Pager [phone]

## 2012-01-30 NOTE — Progress Notes (Signed)
Subjective: Feeling dizzy today.  Complained of chronic abdominal pain which he has from chronic pancreatitis after eating.  Objective: Vital signs in last 24 hours: Filed Vitals:   01/29/12 0942 01/29/12 1437 01/29/12 2059 01/30/12 0528  BP: 160/89 116/79 124/78 130/86  Pulse: 97 101 80 83  Temp: 97.7 F (36.5 C) 98.6 F (37 C) 98.5 F (36.9 C) 98.5 F (36.9 C)  TempSrc: Oral Oral Oral Oral  Resp: 18 18 20 20   Height: 5\' 11"  (1.803 m)     Weight: 70.943 kg (156 lb 6.4 oz)   71.5 kg (157 lb 10.1 oz)  SpO2: 100% 97% 99% 99%   Weight change:   Intake/Output Summary (Last 24 hours) at 01/30/12 0953 Last data filed at 01/30/12 0557  Gross per 24 hour  Intake 1733.75 ml  Output   1475 ml  Net 258.75 ml    Physical Exam: General: Awake, Oriented, No acute distress. HEENT: EOMI. Neck: Supple CV: S1 and S2 Lungs: Clear to ascultation bilaterally Abdomen: Soft, Nontender, Nondistended, +bowel sounds. Ext: Good pulses. Trace edema.  Lab Results: Basic Metabolic Panel:  Lab 01/30/12 7829 01/28/12 2303  NA 137 139  K 4.9 4.6  CL 103 103  CO2 25 26  GLUCOSE 100* 103*  BUN 23 31*  CREATININE 1.65* 2.02*  CALCIUM 9.3 9.9  MG -- --  PHOS -- --   Liver Function Tests:  Lab 01/28/12 2303  AST 30  ALT 52  ALKPHOS 58  BILITOT 0.3  PROT 9.1*  ALBUMIN 4.4   No results found for this basename: LIPASE:5,AMYLASE:5 in the last 168 hours No results found for this basename: AMMONIA:5 in the last 168 hours CBC:  Lab 01/30/12 0530 01/29/12 1635 01/29/12 0412 01/28/12 2303  WBC 7.1 7.4 8.1 8.1  NEUTROABS -- -- -- 5.6  HGB 11.5* 10.9* 11.8* 12.7*  HCT 35.6* 33.5* 36.3* 38.9*  MCV 74.0* 73.5* 73.8* 73.8*  PLT 236 256 279 292   Cardiac Enzymes: No results found for this basename: CKTOTAL:5,CKMB:5,CKMBINDEX:5,TROPONINI:5 in the last 168 hours BNP (last 3 results) No results found for this basename: PROBNP:3 in the last 8760 hours CBG: No results found for this basename:  GLUCAP:5 in the last 168 hours No results found for this basename: HGBA1C:5 in the last 72 hours Other Labs: No components found with this basename: POCBNP:3 No results found for this basename: DDIMER:2 in the last 168 hours No results found for this basename: CHOL:2,HDL:2,LDLCALC:2,TRIG:2,CHOLHDL:2,LDLDIRECT:2 in the last 168 hours No results found for this basename: TSH,T4TOTAL,FREET3,T3FREE,FREET4,THYROIDAB in the last 168 hours No results found for this basename: VITAMINB12:2,FOLATE:2,FERRITIN:2,TIBC:2,IRON:2,RETICCTPCT:2 in the last 168 hours  Micro Results: No results found for this or any previous visit (from the past 240 hour(s)).  Studies/Results: No results found.  Medications: I have reviewed the patient's current medications. Scheduled Meds:   . finasteride  5 mg Oral Daily  . hydrochlorothiazide  25 mg Oral Daily  . levothyroxine  50 mcg Oral Daily  . pantoprazole (PROTONIX) IV  40 mg Intravenous Q12H   Continuous Infusions:   . sodium chloride 75 mL/hr at 01/30/12 0555   PRN Meds:.acetaminophen, acetaminophen, docusate sodium, HYDROcodone-acetaminophen, ondansetron (ZOFRAN) IV, ondansetron  Assessment/Plan: Dark stools/presumed GI bleed  Source unclear. Hemoglobin stable. Trend CBC. Continue pantoprazole. Appreciate GI evaluation, no plans for urgent colonoscopy and EGD at this time. Stool guaiac on admission was negative, send another stool sample for guaiac.   Anemia  Presumed to be from acute blood loss from GI bleed,  may also be due to chronic kidney disease. Hemoglobin stable. No need for blood transfusion at this time.   Renal insuffiencey likely due to CKD stage III  Baseline creatinine 1.35 in 2010, no creatinine for comparison since then.  Renal function improved to 1.65.  Stable.  Abdominal pain  Etiology unclear, likely due to pain from chronic pancreatitis.  Will get abdominal x-ray for further evaluation, if symptoms increase or become more severe  consider CT for further evaluation. Liver function tests normal.  Chronic pancreatitis  Stable. Continue to monitor.   Hyperlipidemia  Hold statin for now.   Hypothyroidism  Continue levothyroxine.   History of portal vein thrombosis on chronic anticoagulation  Hold Coumadin given concern for GI bleed.   Benign prostatic hypertrophy  Continue finasteride.   Hypertension  Stable. Continue hydrochlorothiazide.   Dizziness and generalized weakness Will request PT evaluation.  Prophylaxis  SCDs, no heparin products given concern for bleed.   Disposition  Pending GI evaluation/workup and PT evaluation.   LOS: 1 day  [name] A, MD 01/30/2012, 9:53 AM

## 2012-01-31 LAB — CBC
HCT: 37.7 % — ABNORMAL LOW (ref 39.0–52.0)
HCT: 40.6 % (ref 39.0–52.0)
Hemoglobin: 12.3 g/dL — ABNORMAL LOW (ref 13.0–17.0)
MCH: 24.2 pg — ABNORMAL LOW (ref 26.0–34.0)
MCHC: 32.6 g/dL (ref 30.0–36.0)
MCHC: 32.8 g/dL (ref 30.0–36.0)
MCV: 73.3 fL — ABNORMAL LOW (ref 78.0–100.0)
RDW: 14.9 % (ref 11.5–15.5)
RDW: 14.8 % (ref 11.5–15.5)
WBC: 6.9 10*3/uL (ref 4.0–10.5)

## 2012-01-31 LAB — BASIC METABOLIC PANEL
BUN: 23 mg/dL (ref 6–23)
Chloride: 99 mEq/L (ref 96–112)
Creatinine, Ser: 1.66 mg/dL — ABNORMAL HIGH (ref 0.50–1.35)
GFR calc Af Amer: 50 mL/min — ABNORMAL LOW (ref >90)
Glucose, Bld: 107 mg/dL — ABNORMAL HIGH (ref 70–99)

## 2012-01-31 LAB — OCCULT BLOOD X 1 CARD TO LAB, STOOL: Fecal Occult Bld: NEGATIVE

## 2012-01-31 MED ORDER — SODIUM CHLORIDE 0.9 % IV SOLN
INTRAVENOUS | Status: DC
  Administered 2012-01-31: 20 mL/h via INTRAVENOUS
  Administered 2012-02-01: 09:00:00 via INTRAVENOUS

## 2012-01-31 NOTE — Progress Notes (Signed)
PT Cancellation Note  Treatment cancelled today due to patient's refusal to participate. Patient completing prep for colonoscopy and did not want to move around.  Reports dizziness improved today (2/10).  Also reports headache above eyes ("sinus headache" per patient).  For colonoscopy tomorrow.  Will f/u on Tuesday with dizziness. Vena Austria 01/31/2012, 3:35 PM [phone]

## 2012-01-31 NOTE — Progress Notes (Signed)
Subjective: Complaining of abdominal pain which is different from his chronic pancreatitis pain.  No other specific concerns.  Feeling better today than yesterday.  Objective: Vital signs in last 24 hours: Filed Vitals:   Feb 04, 2012 1553 02-04-12 1556 02/04/2012 2145 01/31/12 0630  BP: 124/82 132/92 126/87 149/97  Pulse:   86 87  Temp:   98.4 F (36.9 C) 97.7 F (36.5 C)  TempSrc:   Oral Oral  Resp:   19 20  Height:      Weight:    68.8 kg (151 lb 10.8 oz)  SpO2:   100% 100%   Weight change: -2.143 kg (-4 lb 11.6 oz)  Intake/Output Summary (Last 24 hours) at 01/31/12 8295 Last data filed at 01/31/12 0500  Gross per 24 hour  Intake    250 ml  Output   1370 ml  Net  -1120 ml    Physical Exam: General: Awake, Oriented, No acute distress. HEENT: EOMI. Neck: Supple CV: S1 and S2 Lungs: Clear to ascultation bilaterally Abdomen: Soft, Nontender, Nondistended, +bowel sounds. Ext: Good pulses. Trace edema.  Lab Results: Basic Metabolic Panel:  Lab 01/31/12 6213 02-04-12 0530 01/28/12 2303  NA 135 137 139  K 4.6 4.9 4.6  CL 99 103 103  CO2 28 25 26   GLUCOSE 107* 100* 103*  BUN 23 23 31*  CREATININE 1.66* 1.65* 2.02*  CALCIUM 10.1 9.3 9.9  MG -- -- --  PHOS -- -- --   Liver Function Tests:  Lab 01/28/12 2303  AST 30  ALT 52  ALKPHOS 58  BILITOT 0.3  PROT 9.1*  ALBUMIN 4.4    Lab 2012-02-04 0622  LIPASE 75*  AMYLASE --   No results found for this basename: AMMONIA:5 in the last 168 hours CBC:  Lab 01/31/12 0500 2012-02-04 1701 Feb 04, 2012 0530 01/29/12 1635 01/29/12 0412 01/28/12 2303  WBC 6.9 6.6 7.1 7.4 8.1 --  NEUTROABS -- -- -- -- -- 5.6  HGB 12.3* 11.5* 11.5* 10.9* 11.8* --  HCT 37.7* 35.5* 35.6* 33.5* 36.3* --  MCV 73.3* 73.7* 74.0* 73.5* 73.8* --  PLT 279 268 236 256 279 --   Cardiac Enzymes: No results found for this basename: CKTOTAL:5,CKMB:5,CKMBINDEX:5,TROPONINI:5 in the last 168 hours BNP (last 3 results) No results found for this basename:  PROBNP:3 in the last 8760 hours CBG: No results found for this basename: GLUCAP:5 in the last 168 hours No results found for this basename: HGBA1C:5 in the last 72 hours Other Labs: No components found with this basename: POCBNP:3 No results found for this basename: DDIMER:2 in the last 168 hours No results found for this basename: CHOL:2,HDL:2,LDLCALC:2,TRIG:2,CHOLHDL:2,LDLDIRECT:2 in the last 168 hours No results found for this basename: TSH,T4TOTAL,FREET3,T3FREE,FREET4,THYROIDAB in the last 168 hours No results found for this basename: VITAMINB12:2,FOLATE:2,FERRITIN:2,TIBC:2,IRON:2,RETICCTPCT:2 in the last 168 hours  Micro Results: No results found for this or any previous visit (from the past 240 hour(s)).  Studies/Results: Dg Abd 2 Views  04-Feb-2012  *RADIOLOGY REPORT*  Clinical Data: Abdominal pain for 5 days.  Bloody stools  ABDOMEN - 2 VIEW  Comparison: None  Findings: Surgical clips noted within the right upper quadrant the abdomen compatible with prior cholecystectomy.  There are air- filled loops of small bowel which measure up to 2.9 cm.  Gas and stool noted within the colon up to the level the rectum.  IMPRESSION:  1.  Nonspecific bowel gas pattern without evidence for high-grade bowel obstruction. 2.  Upper limits of normal small bowel loops may reflect a  low grade partial obstruction or enteritis.   Original Report Authenticated By: Rosealee Albee, M.D.     Medications: I have reviewed the patient's current medications. Scheduled Meds:    . bisacodyl  10 mg Oral Once  . finasteride  5 mg Oral Daily  . hydrochlorothiazide  25 mg Oral Daily  . levothyroxine  50 mcg Oral Daily  . pantoprazole  40 mg Oral Q12H  . polyethylene glycol-electrolytes  4,000 mL Oral Once  . polyethylene glycol-electrolytes  4,000 mL Oral Once  . DISCONTD: pantoprazole  40 mg Oral Q12H  . DISCONTD: pantoprazole (PROTONIX) IV  40 mg Intravenous Q12H   Continuous Infusions:  PRN  Meds:.acetaminophen, acetaminophen, docusate sodium, HYDROcodone-acetaminophen, ondansetron (ZOFRAN) IV, ondansetron  Assessment/Plan: Dark stools/presumed GI bleed  Source unclear. Hemoglobin stable.  Continue pantoprazole. Appreciate GI evaluation.  Plan colonoscopy and EGD on 02/01/2012. Stool guaiac negative x3.    Anemia  Presumed to be from acute blood loss from GI bleed, may also be due to chronic kidney disease. Hemoglobin stable. No need for blood transfusion at this time.   Renal insuffiencey likely due to CKD stage III  Baseline creatinine 1.35 in 2010, no creatinine for comparison since then.  Renal function improved to 1.6, suspect this may be his new baseline.  Abdominal pain  Etiology unclear, likely due to pain from chronic pancreatitis.  Abdominal x-ray on 01/30/2012 showed nonspecific bowel gas pattern without evidence for high-grade bowel obstruction.  Patient to have colonoscopy and EGD tomorrow, which may help in determining the etiology for abdominal pain.  Liver function tests normal.  Chronic pancreatitis  Stable. Continue to monitor.   Hyperlipidemia  Hold statin for now.   Hypothyroidism  Continue levothyroxine.   History of portal vein thrombosis on chronic anticoagulation  Hold Coumadin given concern for GI bleed.   Benign prostatic hypertrophy  Continue finasteride.   Hypertension  Stable. Continue hydrochlorothiazide.   Dizziness and generalized weakness PT evaluation on 01/30/2012 no PT needs.  Prophylaxis  SCDs, no heparin products given concern for bleed.   Disposition  Pending GI evaluation/workup.   LOS: 2 days  Carter Kassel A, MD 01/31/2012, 9:03 AM

## 2012-02-01 ENCOUNTER — Encounter (HOSPITAL_COMMUNITY)
Admission: EM | Disposition: A | Discharge status: Home / Self Care | Payer: Self-pay | Source: Home / Self Care | Attending: Internal Medicine

## 2012-02-01 ENCOUNTER — Encounter (HOSPITAL_COMMUNITY): Payer: Self-pay

## 2012-02-01 DIAGNOSIS — K859 Acute pancreatitis without necrosis or infection, unspecified: Secondary | ICD-10-CM

## 2012-02-01 LAB — BASIC METABOLIC PANEL
BUN: 20 mg/dL (ref 6–23)
Calcium: 9.8 mg/dL (ref 8.4–10.5)
Creatinine, Ser: 1.63 mg/dL — ABNORMAL HIGH (ref 0.50–1.35)
GFR calc Af Amer: 51 mL/min — ABNORMAL LOW (ref >90)
GFR calc non Af Amer: 44 mL/min — ABNORMAL LOW (ref >90)
Glucose, Bld: 93 mg/dL (ref 70–99)
Potassium: 4.4 mEq/L (ref 3.5–5.1)

## 2012-02-01 LAB — CBC
HCT: 37.1 % — ABNORMAL LOW (ref 39.0–52.0)
MCH: 24.1 pg — ABNORMAL LOW (ref 26.0–34.0)
MCHC: 32.6 g/dL (ref 30.0–36.0)
MCV: 73.9 fL — ABNORMAL LOW (ref 78.0–100.0)
RDW: 14.8 % (ref 11.5–15.5)

## 2012-02-01 SURGERY — COLONOSCOPY WITH ESOPHAGOGASTRODUODENOSCOPY (EGD)
Anesthesia: Moderate Sedation

## 2012-02-01 MED ORDER — MIDAZOLAM HCL 10 MG/2ML IJ SOLN
INTRAMUSCULAR | Status: DC | PRN
  Administered 2012-02-01 (×5): 2 mg via INTRAVENOUS

## 2012-02-01 MED ORDER — MIDAZOLAM HCL 5 MG/ML IJ SOLN
INTRAMUSCULAR | Status: AC
  Filled 2012-02-01: qty 3

## 2012-02-01 MED ORDER — FENTANYL CITRATE 0.05 MG/ML IJ SOLN
INTRAMUSCULAR | Status: DC | PRN
  Administered 2012-02-01 (×5): 25 ug via INTRAVENOUS

## 2012-02-01 MED ORDER — DIPHENHYDRAMINE HCL 50 MG/ML IJ SOLN
INTRAMUSCULAR | Status: AC
  Filled 2012-02-01: qty 1

## 2012-02-01 MED ORDER — FENTANYL CITRATE 0.05 MG/ML IJ SOLN
INTRAMUSCULAR | Status: AC
  Filled 2012-02-01: qty 4

## 2012-02-01 MED ORDER — SODIUM CHLORIDE 0.9 % IV SOLN: INTRAVENOUS | Status: DC

## 2012-02-01 NOTE — Op Note (Signed)
[name] [address] [place], [address]   ENDOSCOPY PROCEDURE REPORT  PATIENT: Guy, Farrell  MR#: [id number] BIRTHDATE: [birth year] , 61  yrs. old GENDER: Male ENDOSCOPIST: Willis Modena, MD REFERRED BY:  Andreas Blower, MD [facility]) PROCEDURE DATE:  02/01/2012  PROCEDURE:  EGD w/ biopsy ASA CLASS:     Class III INDICATIONS:  melena, abdominal pain. MEDICATIONS: Cetacaine spray x 2, Fentanyl 125 mcg IV, and Versed 10 mg IV  DESCRIPTION OF PROCEDURE: After the risks benefits and alternatives of the procedure were thoroughly explained, informed consent was obtained.  The    endoscope was introduced through the mouth and advanced to the second portion of the duodenum. Without limitations.  The instrument was slowly withdrawn as the mucosa was fully examined.     Findings:  Normal esophagus.  Small hiatal hernia.  Few hyperplastic-appearing polyps in the body/fundus of stomach, which were biopsied with cold forceps.  Otherwise normal to second portion of duodenum.  No old/fresh blood seen.  ENDOSCOPIC IMPRESSION:     1.  As above. 2.  No source of abdominal pain or dark stool identified.  RECOMMENDATIONS:     1.  Watch for potential complications of procedure. 2.  Await biopsy results. 3.  Proceed with colonoscopy today.  eSigned:  Willis Modena, MD 02/01/2012 10:52 AM   CC:

## 2012-02-01 NOTE — Progress Notes (Signed)
Nsg Discharge Note  Admit Date:  01/29/2012 Discharge date: 02/01/2012   [name] to be D/C'd Home per MD order.  AVS completed.  Copy for chart, and copy for patient signed, and dated. Patient/caregiver able to verbalize understanding.  Discharge Medication:  [name], [name]  Home Medication Instructions ZOX:[id number]   Printed on:02/01/12 1454  Medication Information                    benzoyl peroxide 10 % gel Apply 1 application topically daily.           clindamycin (CLEOCIN) 75 MG/5ML solution Take by mouth 2 (two) times daily. Apply to shoulders, chest, stomach, and back           finasteride (PROSCAR) 5 MG tablet Take 5 mg by mouth daily.           hydrochlorothiazide (HYDRODIURIL) 25 MG tablet Take 25 mg by mouth daily.           levothyroxine (SYNTHROID, LEVOTHROID) 50 MCG tablet Take 50 mcg by mouth daily.           ondansetron (ZOFRAN) 8 MG tablet Take by mouth every 8 (eight) hours as needed. For nausea           simvastatin (ZOCOR) 80 MG tablet Take 80 mg by mouth at bedtime.           triamcinolone cream (KENALOG) 0.1 % Apply 1 application topically 2 (two) times daily.           warfarin (COUMADIN) 5 MG tablet Take 5-7.5 mg by mouth every evening. 1 tablet every day except Thursdays. Thursdays 1 1/2 tablets           omeprazole (PRILOSEC) 20 MG capsule Take 20 mg by mouth daily.             Discharge Assessment: Filed Vitals:   02/01/12 1402  BP: 123/82  Pulse: 121  Temp: 98.3 F (36.8 C)  Resp: 20   Skin clean, dry and intact without evidence of skin break down, no evidence of skin tears noted. IV catheter discontinued intact. Site without signs and symptoms of complications - no redness or edema noted at insertion site, patient denies c/o pain - only slight tenderness at site.  Dressing with slight pressure applied.  D/c Instructions-Education: Discharge instructions given to patient/family with verbalized understanding. D/c education  completed with patient/family including follow up instructions, medication list, d/c activities limitations if indicated, with other d/c instructions as indicated by MD - patient able to verbalize understanding, all questions fully answered. Patient instructed to return to ED, call 911, or call MD for any changes in condition.    [name] Consuella Lose, RN 02/01/2012 2:54 PM

## 2012-02-01 NOTE — Discharge Summary (Signed)
Physician Discharge Summary  [name] UJW:[id number] DOB: Jan 26, 1951 DOA: 01/29/2012  PCP: [facility]  Admit date: 01/29/2012 Discharge date: 02/01/2012  Recommendations for Outpatient Follow-up:  1. Followup with PCP on 02/02/2012 and have PT/INR checked. 2. Followup with Dr. Dulce Sellar in 6-8 weeks. 3. GI to followup on biopsy results.  Discharge Diagnoses:  Principal Problem:  *GI bleed Active Problems:  Pancreatitis  Hyperlipidemia  Colon polyps  Anemia  CKD (chronic kidney disease), stage III  Chronic anticoagulation  Hypertension   Discharge Condition: Stable  Diet recommendation: Heart healthy diet  Filed Weights   01/30/12 0528 01/31/12 0630 02/01/12 0610  Weight: 71.5 kg (157 lb 10.1 oz) 68.8 kg (151 lb 10.8 oz) 71.3 kg (157 lb 3 oz)    History of present illness:  [name] is a 61 y.o. African American male with history of colon polyps, hypertension, hyperlipidemia, chronic pancreatitis, portal vein thrombosis on chronic anticoagulation, who presented with black stools on 01/29/2012.  Hospital Course:  Dark stools/presumed GI bleed  Source unclear. Hemoglobin stable.  Continue PPI. Appreciate GI evaluation.  EGD and Colonoscopy on 02/01/2012 showed small hiatal hernia, few hyperplasitc polyps in the body/fundus of stomach, biopsy pending.  No sources of bleeding in EGD or colonoscopy. Stool guaiac negative x3.    Anemia  Presumed to be from acute blood loss from GI bleed, may also be due to chronic kidney disease. Hemoglobin stable. Has not been transfused during the hospital stay.  Renal insuffiencey likely due to CKD stage III  Baseline creatinine 1.35 in 2010, no creatinine for comparison since then.  Renal function improved to 1.6, suspect this may be his new baseline.  Mild abdominal pain  Etiology unclear, likely due to pain from chronic pancreatitis +/- hiatal hernia?  Abdominal x-ray on 01/30/2012 showed nonspecific bowel gas pattern without  evidence for high-grade bowel obstruction.  Colonoscopy and EGD as above without clear explination for his pain. Currently does not have any pain, instructed the patient to followup with his PCP for further management. Liver function tests normal.  Chronic pancreatitis  Stable. Continue to monitor. Lipase 64, patient tolerated oral diet.  Hyperlipidemia  Resume statin at discharge.  Hypothyroidism  Continue levothyroxine.   History of portal vein thrombosis on chronic anticoagulation  Resume Coumadin at discharge. To have PT/INR checked on 02/02/2012 at [facility].   Benign prostatic hypertrophy  Continue finasteride.   Hypertension  Stable. Continue hydrochlorothiazide.   Dizziness and generalized weakness PT evaluation on 01/30/2012 no PT needs.  Procedures:  As above.  Consultations:  Dr. Dulce Sellar, GI  Discharge Exam: Filed Vitals:   02/01/12 1120  BP: 103/71  Pulse:   Temp:   Resp: 20   Filed Vitals:   02/01/12 1050 02/01/12 1100 02/01/12 1110 02/01/12 1120  BP: 107/70 114/66 108/73 103/71  Pulse:      Temp:      TempSrc:      Resp:  13 21 20   Height:      Weight:      SpO2: 95% 95% 98% 97%   Discharge Instructions  Discharge Orders    Future Orders Please Complete By Expires   Diet - low sodium heart healthy      Increase activity slowly      Discharge instructions      Comments:   Followup with you PCP at [facility] on 02/02/2012 as previously scheduled. Please have PT/INR checked tomorrow.     Medication List  As of 02/01/2012  11:48 AM   TAKE these medications         benzoyl peroxide 10 % gel   Apply 1 application topically daily.      clindamycin 75 MG/5ML solution   Commonly known as: CLEOCIN   Take by mouth 2 (two) times daily. Apply to shoulders, chest, stomach, and back      finasteride 5 MG tablet   Commonly known as: PROSCAR   Take 5 mg by mouth daily.      hydrochlorothiazide 25 MG tablet   Commonly known as: HYDRODIURIL   Take 25  mg by mouth daily.      levothyroxine 50 MCG tablet   Commonly known as: SYNTHROID, LEVOTHROID   Take 50 mcg by mouth daily.      omeprazole 20 MG capsule   Commonly known as: PRILOSEC   Take 20 mg by mouth daily.      ondansetron 8 MG tablet   Commonly known as: ZOFRAN   Take by mouth every 8 (eight) hours as needed. For nausea      simvastatin 80 MG tablet   Commonly known as: ZOCOR   Take 80 mg by mouth at bedtime.      triamcinolone cream 0.1 %   Commonly known as: KENALOG   Apply 1 application topically 2 (two) times daily.      warfarin 5 MG tablet   Commonly known as: COUMADIN   Take 5-7.5 mg by mouth every evening. 1 tablet every day except Thursdays. Thursdays 1 1/2 tablets              The results of significant diagnostics from this hospitalization (including imaging, microbiology, ancillary and laboratory) are listed below for reference.    Significant Diagnostic Studies: Dg Abd 2 Views  01/30/2012  *RADIOLOGY REPORT*  Clinical Data: Abdominal pain for 5 days.  Bloody stools  ABDOMEN - 2 VIEW  Comparison: None  Findings: Surgical clips noted within the right upper quadrant the abdomen compatible with prior cholecystectomy.  There are air- filled loops of small bowel which measure up to 2.9 cm.  Gas and stool noted within the colon up to the level the rectum.  IMPRESSION:  1.  Nonspecific bowel gas pattern without evidence for high-grade bowel obstruction. 2.  Upper limits of normal small bowel loops may reflect a low grade partial obstruction or enteritis.   Original Report Authenticated By: Rosealee Albee, M.D.     Microbiology: No results found for this or any previous visit (from the past 240 hour(s)).   Labs: Basic Metabolic Panel:  Lab 02/01/12 1610 01/31/12 0500 01/30/12 0530 01/28/12 2303  NA 135 135 137 139  K 4.4 4.6 4.9 4.6  CL 99 99 103 103  CO2 25 28 25 26   GLUCOSE 93 107* 100* 103*  BUN 20 23 23  31*  CREATININE 1.63* 1.66* 1.65* 2.02*    CALCIUM 9.8 10.1 9.3 9.9  MG -- -- -- --  PHOS -- -- -- --   Liver Function Tests:  Lab 01/28/12 2303  AST 30  ALT 52  ALKPHOS 58  BILITOT 0.3  PROT 9.1*  ALBUMIN 4.4    Lab 02/01/12 0603 01/30/12 0622  LIPASE 64* 75*  AMYLASE -- --   No results found for this basename: AMMONIA:5 in the last 168 hours CBC:  Lab 02/01/12 0603 01/31/12 1705 01/31/12 0500 01/30/12 1701 01/30/12 0530 01/28/12 2303  WBC 6.4 8.0 6.9 6.6 7.1 --  NEUTROABS -- -- -- -- --  5.6  HGB 12.1* 13.3 12.3* 11.5* 11.5* --  HCT 37.1* 40.6 37.7* 35.5* 35.6* --  MCV 73.9* 74.0* 73.3* 73.7* 74.0* --  PLT 257 278 279 268 236 --   Cardiac Enzymes: No results found for this basename: CKTOTAL:5,CKMB:5,CKMBINDEX:5,TROPONINI:5 in the last 168 hours BNP: BNP (last 3 results) No results found for this basename: PROBNP:3 in the last 8760 hours CBG: No results found for this basename: GLUCAP:5 in the last 168 hours  Time coordinating discharge: 40 minutes  Signed:  Kacee Koren A  [facility] 02/01/2012, 11:48 AM

## 2012-02-01 NOTE — Progress Notes (Signed)
Pt was given discharge instructions, and he is educated on his diagnosis. His skin is intact.

## 2012-02-01 NOTE — Interval H&P Note (Signed)
History and Physical Interval Note:  02/01/2012 10:00 AM  [name]  has presented today for surgery, with the diagnosis of blood in stool  The various methods of treatment have been discussed with the patient and family. After consideration of risks, benefits and other options for treatment, the patient has consented to  Procedure(s) (LRB): COLONOSCOPY WITH ESOPHAGOGASTRODUODENOSCOPY (EGD) (N/A) as a surgical intervention .  The patient's history has been reviewed, patient examined, no change in status, stable for surgery.  I have reviewed the patient's chart and labs.  Questions were answered to the patient's satisfaction.     Keitra Carusone M  Assessment:  1.  Dark stools.  Hemoccult negative x 3. 2.  Mild anemia. 3.  Chronic abdominal pain. 4.  Family history colon cancer/polyps.  Plan:  1.  Endoscopy. 2.  Colonoscopy. 3.  Risks (bleeding, infection, bowel perforation that could require surgery, sedation-related changes in cardiopulmonary systems), benefits (identification and possible treatment of source of symptoms, exclusion of certain causes of symptoms), and alternatives (watchful waiting, radiographic imaging studies, empiric medical treatment) of upper endoscopy (EGD) were explained to patient in detail and heishes to proceed. 4.  Risks (bleeding, infection, bowel perforation that could require surgery, sedation-related changes in cardiopulmonary systems), benefits (identification and possible treatment of source of symptoms, exclusion of certain causes of symptoms), and alternatives (watchful waiting, radiographic imaging studies, empiric medical treatment) of colonoscopy were explained to patient in detail and he wishes to proceed.

## 2012-02-01 NOTE — Progress Notes (Signed)
Patient bowel movement is clear, will continue to monitor. Macarthur Critchley, RN

## 2012-02-01 NOTE — Progress Notes (Signed)
Subjective: Had mild abdominal pain this morning. Resolved at this time.  Objective: Vital signs in last 24 hours: Filed Vitals:   02/01/12 1050 02/01/12 1100 02/01/12 1110 02/01/12 1120  BP: 107/70 114/66 108/73 103/71  Pulse:      Temp:      TempSrc:      Resp:  13 21 20   Height:      Weight:      SpO2: 95% 95% 98% 97%   Weight change: 2.5 kg (5 lb 8.2 oz)  Intake/Output Summary (Last 24 hours) at 02/01/12 1146 Last data filed at 02/01/12 1610  Gross per 24 hour  Intake    120 ml  Output    850 ml  Net   -730 ml    Physical Exam: General: Awake, Oriented, No acute distress. HEENT: EOMI. Neck: Supple CV: S1 and S2 Lungs: Clear to ascultation bilaterally Abdomen: Soft, Nontender, Nondistended, +bowel sounds. Ext: Good pulses. Trace edema.  Lab Results: Basic Metabolic Panel:  Lab 02/01/12 9604 01/31/12 0500 01/30/12 0530 01/28/12 2303  NA 135 135 137 139  K 4.4 4.6 4.9 4.6  CL 99 99 103 103  CO2 25 28 25 26   GLUCOSE 93 107* 100* 103*  BUN 20 23 23  31*  CREATININE 1.63* 1.66* 1.65* 2.02*  CALCIUM 9.8 10.1 9.3 9.9  MG -- -- -- --  PHOS -- -- -- --   Liver Function Tests:  Lab 01/28/12 2303  AST 30  ALT 52  ALKPHOS 58  BILITOT 0.3  PROT 9.1*  ALBUMIN 4.4    Lab 02/01/12 0603 01/30/12 0622  LIPASE 64* 75*  AMYLASE -- --   No results found for this basename: AMMONIA:5 in the last 168 hours CBC:  Lab 02/01/12 0603 01/31/12 1705 01/31/12 0500 01/30/12 1701 01/30/12 0530 01/28/12 2303  WBC 6.4 8.0 6.9 6.6 7.1 --  NEUTROABS -- -- -- -- -- 5.6  HGB 12.1* 13.3 12.3* 11.5* 11.5* --  HCT 37.1* 40.6 37.7* 35.5* 35.6* --  MCV 73.9* 74.0* 73.3* 73.7* 74.0* --  PLT 257 278 279 268 236 --   Cardiac Enzymes: No results found for this basename: CKTOTAL:5,CKMB:5,CKMBINDEX:5,TROPONINI:5 in the last 168 hours BNP (last 3 results) No results found for this basename: PROBNP:3 in the last 8760 hours CBG: No results found for this basename: GLUCAP:5 in the last  168 hours No results found for this basename: HGBA1C:5 in the last 72 hours Other Labs: No components found with this basename: POCBNP:3 No results found for this basename: DDIMER:2 in the last 168 hours No results found for this basename: CHOL:2,HDL:2,LDLCALC:2,TRIG:2,CHOLHDL:2,LDLDIRECT:2 in the last 168 hours No results found for this basename: TSH,T4TOTAL,FREET3,T3FREE,FREET4,THYROIDAB in the last 168 hours No results found for this basename: VITAMINB12:2,FOLATE:2,FERRITIN:2,TIBC:2,IRON:2,RETICCTPCT:2 in the last 168 hours  Micro Results: No results found for this or any previous visit (from the past 240 hour(s)).  Studies/Results: No results found.  Medications: I have reviewed the patient's current medications. Scheduled Meds:    . bisacodyl  10 mg Oral Once  . finasteride  5 mg Oral Daily  . hydrochlorothiazide  25 mg Oral Daily  . levothyroxine  50 mcg Oral Daily  . pantoprazole  40 mg Oral Q12H  . polyethylene glycol-electrolytes  4,000 mL Oral Once  . polyethylene glycol-electrolytes  4,000 mL Oral Once   Continuous Infusions:    . DISCONTD: sodium chloride 20 mL/hr at 02/01/12 0843  . DISCONTD: sodium chloride     PRN Meds:.acetaminophen, acetaminophen, docusate sodium, HYDROcodone-acetaminophen, ondansetron (  ZOFRAN) IV, ondansetron, DISCONTD: fentaNYL, DISCONTD: midazolam  Assessment/Plan: Dark stools/presumed GI bleed  Source unclear. Hemoglobin stable.  Continue PPI. Appreciate GI evaluation.  EGD and Colonoscopy showed small hiatal hernia, few hyperplasitc polyps in the body/fundus of stomach, biopsy pending.  No sources of bleeding in EGD or colonoscopy. Stool guaiac negative x3.    Anemia  Presumed to be from acute blood loss from GI bleed, may also be due to chronic kidney disease. Hemoglobin stable. Has not been transfused..   Renal insuffiencey likely due to CKD stage III  Baseline creatinine 1.35 in 2010, no creatinine for comparison since then.  Renal  function improved to 1.6, suspect this may be his new baseline.  Mild abdominal pain  Etiology unclear, likely due to pain from chronic pancreatitis.  Abdominal x-ray on 01/30/2012 showed nonspecific bowel gas pattern without evidence for high-grade bowel obstruction.  Colonoscopy and EGD as above without clear explination for his pain. Currently does not have any pain, instructed the patient to followup with his PCP for further management. Liver function tests normal.  Chronic pancreatitis  Stable. Continue to monitor. Lipase 64, patient tolerated oral diet.  Hyperlipidemia  Resume statin at discharge.  Hypothyroidism  Continue levothyroxine.   History of portal vein thrombosis on chronic anticoagulation  Resume Coumadin at discharge. To have PT/INR checked on 02/02/2012 at [facility] [place].   Benign prostatic hypertrophy  Continue finasteride.   Hypertension  Stable. Continue hydrochlorothiazide.   Dizziness and generalized weakness PT evaluation on 01/30/2012 no PT needs.  Prophylaxis  SCDs, no heparin products given concern for bleed.   Disposition  Discharge patient today.   LOS: 3 days  [name] A, MD 02/01/2012, 11:46 AM

## 2012-02-01 NOTE — Op Note (Signed)
[name] [address] [place], [address]   COLONOSCOPY PROCEDURE REPORT  PATIENT: [name], [name]  MR#: [id number] BIRTHDATE: 01-04-1951 , 61  yrs. old GENDER: Male ENDOSCOPIST: Willis Modena, MD REFERRED BY: Andreas Blower, MD [facility]) PROCEDURE DATE:  02/01/2012  PROCEDURE:   Colonoscopy, diagnostic ASA CLASS:   Class III INDICATIONS:periumbilical abdominal pain, melena, patient's family history of colon polyps, and patient's immediate family history of colon cancer. MEDICATIONS: Fentanyl 125 mcg IV and Versed 10 mg IV  DESCRIPTION OF PROCEDURE:   After the risks benefits and alternatives of the procedure were thoroughly explained, informed consent was obtained.  A digital rectal exam revealed external hemorrhoids.   The Pentax Ped Colon [id number]  endoscope was introduced through the anus and advanced to the cecum, which was identified by both the appendix and ileocecal valve. No adverse events experienced.   Photographs were taken, but unfortunately not downloaded due to technical malfunction of imaging software. The quality of the prep was good.  The instrument was then slowly withdrawn as the colon was fully examined.    Findings:External hemorrhoids, otherwise, normal digital rectal exam.  Prep quality was good.  No polyps, masses, vascular ectasias, or inflammatory changes were seen.  No diverticula noted.       Retroflexed view of rectum was normal       Withdrawal time was 9 minutes.  The scope was withdrawn and the procedure completed.  ENDOSCOPIC IMPRESSION:     1.  External hemorrhoids, otherwise normal colonoscopy to the cecum.  RECOMMENDATIONS:     1.  Watch for potential complications of procedure. 2.  Repeat colonoscopy in 5 years. 3.  OK to be discharged today from GI perspective, as long as having therapeutic anticoagulation is not prerequisite for discharge. 4.  OK to start warfarin back today. 5.  Will arrange  outpatient [facility] follow-up in 6-8 weeks.   eSigned:  Willis Modena, MD 02/01/2012 11:01 AM   cc:

## 2012-02-02 ENCOUNTER — Encounter (HOSPITAL_COMMUNITY): Payer: Self-pay

## 2013-06-24 ENCOUNTER — Emergency Department (HOSPITAL_COMMUNITY)
Admission: EM | Admit: 2013-06-24 | Discharge: 2013-06-24 | Disposition: A | Discharge status: Home / Self Care | Payer: Non-veteran care | Attending: Emergency Medicine | Admitting: Emergency Medicine

## 2013-06-24 ENCOUNTER — Emergency Department (HOSPITAL_COMMUNITY): Payer: Non-veteran care

## 2013-06-24 ENCOUNTER — Encounter (HOSPITAL_COMMUNITY): Payer: Self-pay | Admitting: Emergency Medicine

## 2013-06-24 DIAGNOSIS — Z87891 Personal history of nicotine dependence: Secondary | ICD-10-CM | POA: Insufficient documentation

## 2013-06-24 DIAGNOSIS — R42 Dizziness and giddiness: Secondary | ICD-10-CM | POA: Insufficient documentation

## 2013-06-24 DIAGNOSIS — D126 Benign neoplasm of colon, unspecified: Secondary | ICD-10-CM | POA: Insufficient documentation

## 2013-06-24 DIAGNOSIS — C189 Malignant neoplasm of colon, unspecified: Secondary | ICD-10-CM | POA: Insufficient documentation

## 2013-06-24 DIAGNOSIS — R51 Headache: Secondary | ICD-10-CM | POA: Insufficient documentation

## 2013-06-24 DIAGNOSIS — E785 Hyperlipidemia, unspecified: Secondary | ICD-10-CM | POA: Insufficient documentation

## 2013-06-24 DIAGNOSIS — K859 Acute pancreatitis without necrosis or infection, unspecified: Secondary | ICD-10-CM | POA: Insufficient documentation

## 2013-06-24 DIAGNOSIS — S0990XA Unspecified injury of head, initial encounter: Secondary | ICD-10-CM

## 2013-06-24 DIAGNOSIS — Z7901 Long term (current) use of anticoagulants: Secondary | ICD-10-CM | POA: Insufficient documentation

## 2013-06-24 DIAGNOSIS — Y92009 Unspecified place in unspecified non-institutional (private) residence as the place of occurrence of the external cause: Secondary | ICD-10-CM | POA: Insufficient documentation

## 2013-06-24 DIAGNOSIS — Z888 Allergy status to other drugs, medicaments and biological substances status: Secondary | ICD-10-CM | POA: Insufficient documentation

## 2013-06-24 DIAGNOSIS — M542 Cervicalgia: Secondary | ICD-10-CM | POA: Insufficient documentation

## 2013-06-24 DIAGNOSIS — Y939 Activity, unspecified: Secondary | ICD-10-CM | POA: Insufficient documentation

## 2013-06-24 DIAGNOSIS — I10 Essential (primary) hypertension: Secondary | ICD-10-CM | POA: Insufficient documentation

## 2013-06-24 DIAGNOSIS — R112 Nausea with vomiting, unspecified: Secondary | ICD-10-CM | POA: Insufficient documentation

## 2013-06-24 DIAGNOSIS — W2209XA Striking against other stationary object, initial encounter: Secondary | ICD-10-CM | POA: Insufficient documentation

## 2013-06-24 DIAGNOSIS — Z86718 Personal history of other venous thrombosis and embolism: Secondary | ICD-10-CM | POA: Insufficient documentation

## 2013-06-24 LAB — PROTIME-INR
INR: 2.57 — ABNORMAL HIGH (ref 0.00–1.49)
PROTHROMBIN TIME: 26.7 s — AB (ref 11.6–15.2)

## 2013-06-24 MED ORDER — ONDANSETRON 4 MG PO TBDP
8.0000 mg | ORAL_TABLET | Freq: Once | ORAL | Status: AC
  Administered 2013-06-24: 8 mg via ORAL
  Filled 2013-06-24: qty 2

## 2013-06-24 MED ORDER — ACETAMINOPHEN 325 MG PO TABS
650.0000 mg | ORAL_TABLET | Freq: Once | ORAL | Status: AC
  Administered 2013-06-24: 650 mg via ORAL
  Filled 2013-06-24: qty 2

## 2013-06-24 NOTE — ED Provider Notes (Signed)
CSN: [id number]     Arrival date & time 06/24/13  1258 History   First MD Initiated Contact with Patient 06/24/13 1504     Chief Complaint  Patient presents with  . 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 Patient struck the top of his head on his carport. He denies LOC but states pain brought him to his knees. Since that time he has headache, neck pain, 'woozy feeling', unsteadiness when walking, nausea. He states one episode of vomiting this morning. Last INR was 2.5 several weeks ago. Denies other injury. The onset of this condition was acute. The course is constant. Aggravating factors: none. Alleviating factors: none.     The history is provided by the patient.    Past Medical History  Diagnosis Date  . Colon cancer   . Pancreatitis   . Hypertension   . Hyperlipidemia   . Colon polyps    Past Surgical History  Procedure Laterality Date  . Cholecystectomy    . Neck surgery    . Thyroid surgery     Family History  Problem Relation Age of Onset  . Colon cancer Mother   . Colon cancer Sister   . Colon cancer Brother    History  Substance Use Topics  . Smoking status: Former Smoker    Types: Cigarettes    Quit date: 01/28/1982  . Smokeless tobacco: Not on file  . Alcohol Use: No    Review of Systems  Constitutional: Negative for fatigue.  HENT: Negative for tinnitus.   Eyes: Negative for photophobia, pain and visual disturbance.  Respiratory: Negative for shortness of breath.   Cardiovascular: Negative for chest pain.  Gastrointestinal: Positive for nausea and vomiting.  Musculoskeletal: Positive for neck pain. Negative for back pain and gait problem.  Skin: Negative for wound.  Neurological: Positive for dizziness, light-headedness and headaches. Negative for weakness and  numbness.  Psychiatric/Behavioral: Negative for confusion and decreased concentration.    Allergies  Aspirin  Home Medications   Current Outpatient Rx  Name  Route  Sig  Dispense  Refill  . atenolol (TENORMIN) 25 MG tablet   Oral   Take 25 mg by mouth daily.         . carboxymethylcellulose 1 % ophthalmic solution   Ophthalmic   Apply 1 drop to eye 4 (four) times daily as needed (for eye irritation).         . clindamycin (CLEOCIN) 75 MG/5ML solution   Oral   Take by mouth 2 (two) times daily. Apply to shoulders, chest, stomach, and back         . coal tar-salicylic acid 2 % shampoo   Topical   Apply 1 application topically 3 (three) times a week.         . finasteride (PROSCAR) 5 MG tablet   Oral   Take 5 mg by mouth daily.         . hydrochlorothiazide (HYDRODIURIL) 25 MG tablet   Oral   Take 25 mg by mouth daily.         Marland Kitchen levothyroxine (SYNTHROID, LEVOTHROID) 50 MCG tablet   Oral   Take 50 mcg by mouth daily.         . lipase/protease/amylase (CREON-12/PANCREASE) 12000 UNITS CPEP capsule   Oral   Take 2 capsules by mouth 3 (three) times daily before meals.         Marland Kitchen  lisinopril (PRINIVIL,ZESTRIL) 40 MG tablet   Oral   Take 20 mg by mouth daily.         . magnesium oxide (MAG-OX) 400 MG tablet   Oral   Take 400 mg by mouth at bedtime.         Marland Kitchen omeprazole (PRILOSEC) 20 MG capsule   Oral   Take 20 mg by mouth daily.         . ondansetron (ZOFRAN) 8 MG tablet   Oral   Take by mouth every 8 (eight) hours as needed. For nausea         . simvastatin (ZOCOR) 80 MG tablet   Oral   Take 80 mg by mouth at bedtime.         Marland Kitchen terazosin (HYTRIN) 1 MG capsule   Oral   Take 1 mg by mouth at bedtime.         . triamcinolone cream (KENALOG) 0.1 %   Topical   Apply 1 application topically 2 (two) times daily.         Marland Kitchen warfarin (COUMADIN) 5 MG tablet   Oral   Take 5-7.5 mg by mouth every evening. 1 tablet every day except  Thursdays. Thursdays 1 1/2 tablets         . benzoyl peroxide 10 % gel   Topical   Apply 1 application topically daily.          BP 148/84  Pulse 124  Temp(Src) 97.6 F (36.4 C) (Oral)  Resp 20  Ht 5\' 10"  (1.778 m)  Wt 160 lb (72.576 kg)  BMI 22.96 kg/m2  SpO2 100% Physical Exam  Nursing note and vitals reviewed. Constitutional: He is oriented to person, place, and time. He appears well-developed and well-nourished.  HENT:  Head: Normocephalic. Head is without raccoon's eyes and without Battle's sign.  Right Ear: Tympanic membrane, external ear and ear canal normal. No hemotympanum.  Left Ear: Tympanic membrane, external ear and ear canal normal. No hemotympanum.  Nose: Nose normal. No nasal septal hematoma.  Mouth/Throat: Oropharynx is clear and moist.  Slight clean appearing abrasions to crown of scalp.   Eyes: Conjunctivae, EOM and lids are normal. Pupils are equal, round, and reactive to light.  No visible hyphema  Neck: Normal range of motion. Neck supple.  Cardiovascular: Normal rate and regular rhythm.   Pulmonary/Chest: Effort normal and breath sounds normal.  Abdominal: Soft. There is no tenderness.  Musculoskeletal: Normal range of motion.       Cervical back: He exhibits normal range of motion, no tenderness and no bony tenderness.       Thoracic back: He exhibits no tenderness and no bony tenderness.       Lumbar back: He exhibits no tenderness and no bony tenderness.  Neurological: He is alert and oriented to person, place, and time. He has normal strength and normal reflexes. No cranial nerve deficit or sensory deficit. Coordination normal. GCS eye subscore is 4. GCS verbal subscore is 5. GCS motor subscore is 6.  Skin: Skin is warm and dry.  Psychiatric: He has a normal mood and affect.    ED Course  Procedures (including critical care time) Labs Review Labs Reviewed  PROTIME-INR - Abnormal; Notable for the following:    Prothrombin Time 26.7 (*)     INR 2.57 (*)    All other components within normal limits   Imaging Review Ct Head Wo Contrast  06/24/2013   CLINICAL DATA:  Trauma patient on  anti coagulation therapy  EXAM: CT HEAD WITHOUT CONTRAST  CT CERVICAL SPINE WITHOUT CONTRAST  TECHNIQUE: Multidetector CT imaging of the head and cervical spine was performed following the standard protocol without intravenous contrast. Multiplanar CT image reconstructions of the cervical spine were also generated.  COMPARISON:  CT NECK W/CM dated 07/01/2005; US ASPIRATION dated 01/04/2004; US SOFT TISSUE HEAD/NECK dated 01/04/2004; CT HEAD W/O CM dated 12/28/2003; MR MRA HEAD W/O CM dated 12/28/2003; MR HEAD WO/W CM dated 12/28/2003  FINDINGS: CT HEAD FINDINGS  No intracranial hemorrhage. No parenchymal contusion. No midline shift or mass effect. Basilar cisterns are patent. No skull base fracture. No fluid in the paranasal sinuses or mastoid air cells. There is mild periventricular white matter hypodensities. Mild atrophy. Mild polypoid mucosal thickening in the maxillary sinuses. Frontal sinuses are clear.  CT CERVICAL SPINE FINDINGS  No prevertebral soft tissue swelling. Normal alignment of cervical vertebral bodies. No loss of vertebral body height. Normal facet articulation. Normal craniocervical junction.  No evidence epidural or paraspinal hematoma.  There is multiple levels of endplate spurring and joint space narrowing. There is fusion of the C5 and C6 vertebral bodies.  Partial resection of the thyroid gland with the left hemithyroidectomy. . There is some nodularity of the right gland 2.6 cm. This appears similar to comparison ultrasound 01/04/2004.  IMPRESSION:  1. No acute intracranial trauma. 2. Mild microvascular disease. 3. No cervical spine fracture. 4. 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 5. Left hemithyroidectomy. Nodule within the right lobe of thyroid gland appears stable.   Electronically Signed   By: Suzy Bouchard M.D.    On: 06/24/2013 17:10    EKG Interpretation   None      3:15 PM Patient seen and examined. Work-up initiated. Medications ordered.   Vital signs reviewed and are as follows: Filed Vitals:   06/24/13 1310  BP: 148/84  Pulse: 124  Temp: 97.6 F (36.4 C)  Resp: 20   CT reviewed by myself. Patient informed of all results. Exam is unchanged.  Patient was counseled on head injury precautions/possibility of delayed bleeding and symptoms that should indicate their return to the ED.  These include severe worsening headache, vision changes, confusion, loss of consciousness, trouble walking, nausea & vomiting, or weakness/tingling in extremities.    Counseled to followup with PCP if he continues to have concussion symptoms for recheck.  BP 132/86  Pulse 105  Temp(Src) 97.6 F (36.4 C) (Oral)  Resp 13  Ht 5\' 10"  (1.778 m)  Wt 160 lb (72.576 kg)  BMI 22.96 kg/m2  SpO2 100%   MDM   1. Head injury    Patient with head injury, normal neurologic exam, on Coumadin. CT of head and neck are negative. Cannot rule out concussion. Appropriate return instructions given.  No dangerous or life-threatening conditions suspected or identified by history, physical exam, and by work-up. No indications for hospitalization identified.      Carlisle Cater, PA-C 06/24/13 2002  Carlisle Cater, PA-C 06/24/13 2002

## 2013-06-24 NOTE — ED Notes (Signed)
Pt states he hit his head yesterday while doing some house work.  Pt states he hit the top of his head on the carport.  Pt states he hit so hard it brought him to his knees.  Pt states he "got dizzy and saw stars", but denies LOC.  Pt presents today with continued c/o head pain, dizziness, and pt states he's nauseas.

## 2013-06-24 NOTE — Discharge Instructions (Signed)
Please read and follow all provided instructions.  Your diagnoses today include:  1. Head injury     Tests performed today include:  CT scan of your head and neck that did not show any serious injury.  Your coumadin level (INR) was 2.57.  Vital signs. See below for your results today.   Medications prescribed:   None  Take any prescribed medications only as directed.  Home care instructions:  Follow any educational materials contained in this packet.  BE VERY CAREFUL not to take multiple medicines containing Tylenol (also called acetaminophen). Doing so can lead to an overdose which can damage your liver and cause liver failure and possibly death.   Follow-up instructions: Please follow-up with your primary care provider in the next 3 days for further evaluation of your symptoms. If you do not have a primary care doctor -- see below for referral information.   Return instructions:  SEEK IMMEDIATE MEDICAL ATTENTION IF:  There is confusion or drowsiness (although children frequently become drowsy after injury).   You cannot awaken the injured person.   You have more than one episode of vomiting.   You notice dizziness or unsteadiness which is getting worse, or inability to walk.   You have convulsions or unconsciousness.   You experience severe, persistent headaches not relieved by Tylenol.  You cannot use arms or legs normally.   There are changes in pupil sizes. (This is the black center in the colored part of the eye)   There is clear or bloody discharge from the nose or ears.   You have change in speech, vision, swallowing, or understanding.   Localized weakness, numbness, tingling, or change in bowel or bladder control.  You have any other emergent concerns.  Additional Information: You have had a head injury which does not appear to require admission at this time.  Your vital signs today were: BP 143/84   Pulse 102   Temp(Src) 97.6 F (36.4 C) (Oral)    Resp 16   Ht 5\' 10"  (1.778 m)   Wt 160 lb (72.576 kg)   BMI 22.96 kg/m2   SpO2 98% If your blood pressure (BP) was elevated above 135/85 this visit, please have this repeated by your doctor within one month. --------------  Emergency Department Resource Guide 1) Find a Doctor and Pay Out of Pocket Although you won't have to find out who is covered by your insurance plan, it is a good idea to ask around and get recommendations. You will then need to call the office and see if the doctor you have chosen will accept you as a new patient and what types of options they offer for patients who are self-pay. Some doctors offer discounts or will set up payment plans for their patients who do not have insurance, but you will need to ask so you aren't surprised when you get to your appointment.  2) Contact Your Local Health Department Not all health departments have doctors that can see patients for sick visits, but many do, so it is worth a call to see if yours does. If you don't know where your local health department is, you can check in your phone book. The CDC also has a tool to help you locate your state's health department, and many state websites also have listings of all of their local health departments.  3) Find a [facility] If your illness is not likely to be very severe or complicated, you may want to try a walk  in clinic. These are popping up all over the country in pharmacies, drugstores, and shopping centers. They're usually staffed by nurse practitioners or physician assistants that have been trained to treat common illnesses and complaints. They're usually fairly quick and inexpensive. However, if you have serious medical issues or chronic medical problems, these are probably not your best option.  No Primary Care Doctor: - Call Health Connect at  [phone] - they can help you locate a primary care doctor that  accepts your insurance, provides certain services, etc. - Physician Referral  Service- [phone]  Chronic Pain Problems: Organization         Address  Phone   Notes  [facility]  [phone] Patients need to be referred by their primary care doctor.   Medication Assistance: Organization         Address  Phone   Notes  [facility] Medication [facility] [address] [place], [place] [address] [phone] --Must be a resident of [facility] -- Must have NO insurance coverage whatsoever (no Medicaid/ Medicare, etc.) -- The pt. MUST have a primary care doctor that directs their care regularly and follows them in the community   [facility]ist  [phone]   [employer]  [phone]    Agencies that provide inexpensive medical care: Organization         Address  Phone   Notes  [place]  [phone]   [facility]    [phone]   [facility] [place], [place] [address] [phone]   [place] [address] [place] [phone]   Planned Parenthood    [phone]   [facility]    [phone]   [facility] [place] and [address], [place] Phone:  [phone], Fax:  [phone] Hours of Operation:  9 am - 6 pm, M-F.  Also accepts Medicaid/Medicare and self-pay.  [facility] for [place], [address],  Phone: [phone], Fax: [phone]. Hours of Operation:  8:30 am - 5:30 pm, M-F.  Also accepts Medicaid and self-pay.  [facility] [address], [place] Phone: [phone]   [place], [place], [place] [phone], Ext. [phone] Mondays & Thursdays: 7-9 AM.  First 15 patients are seen on a first come, first serve basis.    [place] Providers:  Organization         Address  Phone   Notes  [facility] [address],  [phone] Also accepts self-pay patients.  [facility] [address], [place]  [phone]   [place], [address], [place] [phone]   [facility] [address] [place] [phone]   Lucianne Lei [address], [place]   [phone] Only accepts [place] Access [place] patients after they have their name applied to their card.   Self-Pay (no insurance) in [facility]:  Organization         Address  Phone   Notes  Sickle Cell Patients, [facility] Internal Medicine [place] [phone]   [facility] [place] [phone]   [facility]  [facility] [place]  [place], [address],  [phone]   [facility]/Dr. Osei-Bonsu  [address] [place] or [address], [place] [phone] Phone number for both [place] and [place] locations is the same.  [facility]gent [facility] and [facility] [address], [place] [phone]   [facility] [address], [place] or [address] [phone]   [facility] [address], [place] [phone], phone; [phone], fax Sees patients 1st and 3rd Saturday of every month.  Must not qualify for public or private insurance (i.e. Medicaid, Medicare, [place] Health Choice, Veterans' Benefits)  Household income should be no more than 200% of the poverty level The clinic cannot treat you if you are pregnant or think you are pregnant  Sexually transmitted diseases are not treated at the clinic.    Dental Care: Organization         Address  Phone  Notes  [facility] Department of [facility] [place] [phone] Accepts children up to age 37 who are enrolled in  [place] or [place]; pregnant women with a Medicaid card; and children who have applied for Medicaid or [place] Health Choice, but were declined, whose parents can pay a reduced fee at time of service.  [facility] Department of [facility]  [address], [place] [phone] Accepts children up to age 63 who are enrolled in [place] or [place]; pregnant women with a Medicaid card; and children who have applied for Medicaid or [place] Health Choice, but were declined, whose parents can pay a reduced fee at time of service.  [place] Adult [facility] Access PROGRAM  [place] [phone] Patients are seen by appointment only. Walk-ins are not accepted. [place] will see patients 72 years of age and older. Monday - Tuesday (8am-5pm) Most Wednesdays (8:30-5pm) $30 per visit, cash only  [facility] Adult Dental Access PROGRAM  [address], [facility] [phone] Patients are seen by appointment only. Walk-ins are not accepted. [place] will see patients 98 years of age and older. One Wednesday Evening (Monthly: Volunteer Based).  $30 per visit, cash only  [place]  [phone] for adults; Children under age 41, call Graduate Pediatric Dentistry at [phone]. Children aged 53-14, please call [phone] to request a pediatric application.  Dental services are provided in all areas of dental care including fillings, crowns and bridges, complete and partial dentures, implants, gum treatment, root canals, and extractions. Preventive care is also provided. Treatment is provided to both adults and children. Patients are selected via a lottery and there is often a waiting list.   [facility] [address], [place]  [phone] [email or web address]   [facility] [address] [place], [place] [phone], Ext. [phone] Second and Fourth Thursday of each month, opens at 6:30  AM; Clinic ends at 9 AM.  Patients are seen on a first-come first-served basis, and a limited number are seen during each clinic.   [facility]  [address] Hillard Danker [place], [place] [phone]   Eligibility Requirements You must have lived in [place], [place], or [place] counties for at least the last three months.   You cannot be eligible for state or federal sponsored [employer],  including [employer], [place], or [employer].   You generally cannot be eligible for healthcare insurance through your employer.    How to apply: Eligibility screenings are held every Tuesday and Wednesday afternoon from 1:00 pm until 4:00 pm. You do not need an appointment for the interview!  [facility] [address] [place], [place] [phone]   [facility] Health Department  [phone]   [facility] Health Department  [phone]   [facility] Health Department  [phone]    Behavioral Health Resources in the Community: Intensive Outpatient Programs Organization         Address  Phone  Notes  [facility] Services [address] [place], [place] [phone]   [facility] Outpatient [address] [place], [place] [phone]   ADS: Alcohol & Drug Svcs [address] [place], [place]  [phone]   [facility] Mental Health [address]  [place], [place] [phone] or [phone]   Substance Abuse Resources Organization         Address  Phone  Notes  Alcohol and Drug Services  [phone]   [facility]  [phone]   [facility] [place]  [phone]   Floydene Flock  [phone]   Residential & Outpatient Substance Abuse Program  [phone]   Psychological Services Organization         Address  Phone  Notes  [facility] Health  [phone]   [facility] Services  [phone]   [facility] Mental Health [address], [place] [phone] or  [phone]    Mobile Crisis Teams Organization         Address  Phone  Notes  [facility], Mobile Crisis Care Unit  [phone]   [facility]  [address] [place], [place] [phone]   Doristine Locks [address] [place] [phone]    Self-Help/Support Groups Organization         Address  Phone             Notes  [facility] [place] - variety of support groups  [phone]- I7437963 Call for more information  Narcotics Anonymous (NA), [facility] [address], [employer] [place]  2 meetings at this location   Statistician         Address  Phone  Notes  [facility] [address],    [place]  [phone]   [facility]  [address], [place] [address], [place], [place] [phone]   [facility] Treatment Facility [address] [place], [place] [phone] Admissions: 8am-3pm M-F  [facility] [address],    [place], [place] [phone]   [facility] [address] [place], [place], [place] [phone]   [facility] [address],  [place], [place] [phone]   [facility] - Intensive Outpatient [address], [place], [place] [phone]   [facility] (Addiction Recovery Care [facility] [address] [place]  [place], [place] [phone] or [phone]   [facility] Services (RTS) [address], [place], [place] [phone] Accepts Medicaid  [facility] [place] [address],  [place] [phone] Substance Abuse/Addiction Treatment   [facility] Organization         Address  Phone  Notes  [facility]  [phone]   Angie Fava, PhD [address] [place], [place]   [phone] or [phone]  [facility]   [address] [place], [place] [phone]   [address],  [place], [place] [phone] Insurance/Medicaid/sponsorship through [facility] and Families [address], Ste [place], [place] [phone] [place], [place] [phone]    Dr. Adele Schilder  [phone]   [facility] Dept. 1) [address], [place] 2) [place] 3)  [address], [place] [phone]  [phone]   [place] [phone] or [phone] (After Hours)

## 2013-06-25 NOTE — ED Provider Notes (Signed)
Medical screening examination/treatment/procedure(s) were performed by non-physician practitioner and as supervising physician I was immediately available for consultation/collaboration.  EKG Interpretation   None         Saddie Benders. Enis Leatherwood, MD 06/25/13 2351

## 2013-10-19 ENCOUNTER — Emergency Department (HOSPITAL_COMMUNITY): Payer: Non-veteran care

## 2013-10-19 ENCOUNTER — Emergency Department (HOSPITAL_COMMUNITY)
Admission: EM | Admit: 2013-10-19 | Discharge: 2013-10-19 | Disposition: A | Discharge status: Home / Self Care | Payer: Non-veteran care | Attending: Emergency Medicine | Admitting: Emergency Medicine

## 2013-10-19 ENCOUNTER — Encounter (HOSPITAL_COMMUNITY): Payer: Self-pay | Admitting: Emergency Medicine

## 2013-10-19 DIAGNOSIS — Z792 Long term (current) use of antibiotics: Secondary | ICD-10-CM | POA: Insufficient documentation

## 2013-10-19 DIAGNOSIS — E785 Hyperlipidemia, unspecified: Secondary | ICD-10-CM | POA: Insufficient documentation

## 2013-10-19 DIAGNOSIS — Y9389 Activity, other specified: Secondary | ICD-10-CM | POA: Insufficient documentation

## 2013-10-19 DIAGNOSIS — K859 Acute pancreatitis without necrosis or infection, unspecified: Secondary | ICD-10-CM | POA: Insufficient documentation

## 2013-10-19 DIAGNOSIS — Z87891 Personal history of nicotine dependence: Secondary | ICD-10-CM | POA: Insufficient documentation

## 2013-10-19 DIAGNOSIS — M542 Cervicalgia: Secondary | ICD-10-CM

## 2013-10-19 DIAGNOSIS — Z7901 Long term (current) use of anticoagulants: Secondary | ICD-10-CM | POA: Insufficient documentation

## 2013-10-19 DIAGNOSIS — Y92410 Unspecified street and highway as the place of occurrence of the external cause: Secondary | ICD-10-CM | POA: Insufficient documentation

## 2013-10-19 DIAGNOSIS — S199XXA Unspecified injury of neck, initial encounter: Principal | ICD-10-CM

## 2013-10-19 DIAGNOSIS — S0990XA Unspecified injury of head, initial encounter: Secondary | ICD-10-CM | POA: Insufficient documentation

## 2013-10-19 DIAGNOSIS — Z79899 Other long term (current) drug therapy: Secondary | ICD-10-CM | POA: Insufficient documentation

## 2013-10-19 DIAGNOSIS — M5030 Other cervical disc degeneration, unspecified cervical region: Secondary | ICD-10-CM | POA: Insufficient documentation

## 2013-10-19 DIAGNOSIS — S0993XA Unspecified injury of face, initial encounter: Secondary | ICD-10-CM | POA: Insufficient documentation

## 2013-10-19 DIAGNOSIS — I10 Essential (primary) hypertension: Secondary | ICD-10-CM | POA: Insufficient documentation

## 2013-10-19 DIAGNOSIS — M4802 Spinal stenosis, cervical region: Secondary | ICD-10-CM | POA: Insufficient documentation

## 2013-10-19 DIAGNOSIS — Z85038 Personal history of other malignant neoplasm of large intestine: Secondary | ICD-10-CM | POA: Insufficient documentation

## 2013-10-19 MED ORDER — HYDROCODONE-ACETAMINOPHEN 5-325 MG PO TABS: 1.0000 | ORAL_TABLET | Freq: Four times a day (QID) | ORAL | Status: DC | PRN
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
## 2013-10-27 NOTE — ED Provider Notes (Signed)
Medical screening examination/treatment/procedure(s) were performed by non-physician practitioner and as supervising physician I was immediately available for consultation/collaboration.   Dot Lanes, MD 10/27/13 [phone]

## 2013-12-07 HISTORY — PX: SHOULDER ARTHROSCOPY: SHX128

## 2014-02-22 ENCOUNTER — Emergency Department (HOSPITAL_COMMUNITY): Payer: Non-veteran care

## 2014-02-22 ENCOUNTER — Encounter (HOSPITAL_COMMUNITY): Payer: Self-pay | Admitting: Emergency Medicine

## 2014-02-22 ENCOUNTER — Emergency Department (HOSPITAL_COMMUNITY)
Admission: EM | Admit: 2014-02-22 | Discharge: 2014-02-22 | Disposition: A | Discharge status: Home / Self Care | Payer: Non-veteran care | Attending: Emergency Medicine | Admitting: Emergency Medicine

## 2014-02-22 DIAGNOSIS — IMO0002 Reserved for concepts with insufficient information to code with codable children: Secondary | ICD-10-CM | POA: Insufficient documentation

## 2014-02-22 DIAGNOSIS — R296 Repeated falls: Secondary | ICD-10-CM | POA: Diagnosis not present

## 2014-02-22 DIAGNOSIS — S46911A Strain of unspecified muscle, fascia and tendon at shoulder and upper arm level, right arm, initial encounter: Secondary | ICD-10-CM

## 2014-02-22 DIAGNOSIS — Y9389 Activity, other specified: Secondary | ICD-10-CM | POA: Insufficient documentation

## 2014-02-22 DIAGNOSIS — Z8719 Personal history of other diseases of the digestive system: Secondary | ICD-10-CM | POA: Insufficient documentation

## 2014-02-22 DIAGNOSIS — S81009A Unspecified open wound, unspecified knee, initial encounter: Secondary | ICD-10-CM | POA: Insufficient documentation

## 2014-02-22 DIAGNOSIS — I10 Essential (primary) hypertension: Secondary | ICD-10-CM | POA: Insufficient documentation

## 2014-02-22 DIAGNOSIS — S81811A Laceration without foreign body, right lower leg, initial encounter: Secondary | ICD-10-CM

## 2014-02-22 DIAGNOSIS — Z87891 Personal history of nicotine dependence: Secondary | ICD-10-CM | POA: Diagnosis not present

## 2014-02-22 DIAGNOSIS — Z7901 Long term (current) use of anticoagulants: Secondary | ICD-10-CM | POA: Diagnosis not present

## 2014-02-22 DIAGNOSIS — Y929 Unspecified place or not applicable: Secondary | ICD-10-CM | POA: Insufficient documentation

## 2014-02-22 DIAGNOSIS — Z96619 Presence of unspecified artificial shoulder joint: Secondary | ICD-10-CM | POA: Insufficient documentation

## 2014-02-22 DIAGNOSIS — Z860100 Personal history of colon polyps, unspecified: Secondary | ICD-10-CM | POA: Insufficient documentation

## 2014-02-22 DIAGNOSIS — W19XXXA Unspecified fall, initial encounter: Secondary | ICD-10-CM

## 2014-02-22 DIAGNOSIS — Z85038 Personal history of other malignant neoplasm of large intestine: Secondary | ICD-10-CM | POA: Insufficient documentation

## 2014-02-22 DIAGNOSIS — S0990XA Unspecified injury of head, initial encounter: Secondary | ICD-10-CM | POA: Insufficient documentation

## 2014-02-22 DIAGNOSIS — Z792 Long term (current) use of antibiotics: Secondary | ICD-10-CM | POA: Diagnosis not present

## 2014-02-22 DIAGNOSIS — E785 Hyperlipidemia, unspecified: Secondary | ICD-10-CM | POA: Insufficient documentation

## 2014-02-22 DIAGNOSIS — S81809A Unspecified open wound, unspecified lower leg, initial encounter: Principal | ICD-10-CM

## 2014-02-22 DIAGNOSIS — Z79899 Other long term (current) drug therapy: Secondary | ICD-10-CM | POA: Insufficient documentation

## 2014-02-22 DIAGNOSIS — S91009A Unspecified open wound, unspecified ankle, initial encounter: Secondary | ICD-10-CM | POA: Diagnosis not present

## 2014-02-22 LAB — CBC
HCT: 36.7 % — ABNORMAL LOW (ref 39.0–52.0)
HEMOGLOBIN: 11.9 g/dL — AB (ref 13.0–17.0)
MCH: 23.8 pg — ABNORMAL LOW (ref 26.0–34.0)
MCHC: 32.4 g/dL (ref 30.0–36.0)
MCV: 73.3 fL — AB (ref 78.0–100.0)
Platelets: 263 10*3/uL (ref 150–400)
RBC: 5.01 MIL/uL (ref 4.22–5.81)
RDW: 15.4 % (ref 11.5–15.5)
WBC: 4.6 10*3/uL (ref 4.0–10.5)

## 2014-02-22 LAB — PROTIME-INR
INR: 2.87 — AB (ref 0.00–1.49)
Prothrombin Time: 30.1 seconds — ABNORMAL HIGH (ref 11.6–15.2)

## 2014-02-22 MED ORDER — HYDROCODONE-ACETAMINOPHEN 5-325 MG PO TABS: 1.0000 | ORAL_TABLET | ORAL | Status: DC | PRN

## 2014-02-22 MED ORDER — HYDROCODONE-ACETAMINOPHEN 5-325 MG PO TABS
1.0000 | ORAL_TABLET | Freq: Once | ORAL | Status: AC
  Administered 2014-02-22: 1 via ORAL
  Filled 2014-02-22: qty 1

## 2014-02-22 NOTE — ED Provider Notes (Signed)
CSN: [id number]     Arrival date & time 02/22/14  4098 History  This chart was scribed for Mariea Clonts, MD by Zola Button, ED Scribe. This patient was seen in room APA06/[facility]06 and the patient's care was started at 7:53 AM.     Chief Complaint  Patient presents with  . Fall      Patient is a 63 y.o. male presenting with fall. The history is provided by the patient. No language interpreter was used.  Fall This is a new problem. The current episode started 1 to 2 hours ago. The problem has not changed since onset.Pertinent negatives include no chest pain, no abdominal pain and no shortness of breath. He has tried nothing for the symptoms.   HPI Comments: RAMONA SLINGER is a 63 y.o. male who presents to the Emergency Department complaining of a fall on his right shoulder this morning after he got in the tub for a shower . He notes associated dizziness, head injury, right shoulder pain, right ankle pain and lacerations on his right ankle. He denies CP, SOB, LOC, back pain, and abdominal pain. He is currently on Coumadin and his INR was 3.2 last time he got it checked. Patient has Hx of arthroscopy of the right shoulder.  Past Medical History  Diagnosis Date  . Colon cancer   . Pancreatitis   . Hypertension   . Hyperlipidemia   . Colon polyps    Past Surgical History  Procedure Laterality Date  . Cholecystectomy    . Neck surgery    . Thyroid surgery    . Shoulder arthroscopy Right December 07 2013   Family History  Problem Relation Age of Onset  . Colon cancer Mother   . Colon cancer Sister   . Colon cancer Brother    History  Substance Use Topics  . Smoking status: Former Smoker    Types: Cigarettes    Quit date: 01/28/1982  . Smokeless tobacco: Not on file  . Alcohol Use: No    Review of Systems  Respiratory: Negative for shortness of breath.   Cardiovascular: Negative for chest pain.  Gastrointestinal: Negative for abdominal pain.  Musculoskeletal: Positive for  arthralgias (right shoulder and right ankle).  Skin: Positive for wound.  Neurological: Positive for dizziness. Negative for syncope.  All other systems reviewed and are negative.     Allergies  Aspirin  Home Medications   Prior to Admission medications   Medication Sig Start Date End Date Taking? Authorizing Provider  atenolol (TENORMIN) 25 MG tablet Take 25 mg by mouth daily.    Historical Provider, MD  benzoyl peroxide 10 % gel Apply 1 application topically daily.    Historical Provider, MD  carboxymethylcellulose 1 % ophthalmic solution Apply 1 drop to eye 4 (four) times daily as needed (for eye irritation).    Historical Provider, MD  clindamycin (CLEOCIN) 75 MG/5ML solution Take by mouth 2 (two) times daily. Apply to shoulders, chest, stomach, and back    Historical Provider, MD  coal tar-salicylic acid 2 % shampoo Apply 1 application topically 3 (three) times a week.    Historical Provider, MD  finasteride (PROSCAR) 5 MG tablet Take 5 mg by mouth daily.    Historical Provider, MD  hydrochlorothiazide (HYDRODIURIL) 25 MG tablet Take 25 mg by mouth daily.    Historical Provider, MD  HYDROcodone-acetaminophen (NORCO/VICODIN) 5-325 MG per tablet Take 1 tablet by mouth every 6 (six) hours as needed for moderate pain or severe pain. 10/19/13  Clayton Bibles, PA-C  levothyroxine (SYNTHROID, LEVOTHROID) 50 MCG tablet Take 50 mcg by mouth daily.    Historical Provider, MD  lipase/protease/amylase (CREON-12/PANCREASE) 12000 UNITS CPEP capsule Take 2 capsules by mouth 3 (three) times daily before meals.    Historical Provider, MD  lisinopril (PRINIVIL,ZESTRIL) 40 MG tablet Take 20 mg by mouth daily.    Historical Provider, MD  magnesium oxide (MAG-OX) 400 MG tablet Take 400 mg by mouth at bedtime.    Historical Provider, MD  omeprazole (PRILOSEC) 20 MG capsule Take 20 mg by mouth daily.    Historical Provider, MD  ondansetron (ZOFRAN) 8 MG tablet Take by mouth every 8 (eight) hours as needed. For  nausea    Historical Provider, MD  simvastatin (ZOCOR) 80 MG tablet Take 80 mg by mouth at bedtime.    Historical Provider, MD  terazosin (HYTRIN) 1 MG capsule Take 1 mg by mouth at bedtime.    Historical Provider, MD  triamcinolone cream (KENALOG) 0.1 % Apply 1 application topically 2 (two) times daily.    Historical Provider, MD  warfarin (COUMADIN) 5 MG tablet Take 5-7.5 mg by mouth every evening. 1 tablet every day except Thursdays. Thursdays 1 1/2 tablets    Historical Provider, MD   BP 136/91  Pulse 106  Temp(Src) 97.7 F (36.5 C) (Oral)  Resp 17  Ht 5\' 10"  (1.778 m)  Wt 160 lb (72.576 kg)  BMI 22.96 kg/m2  SpO2 96% Physical Exam  Nursing note and vitals reviewed. Constitutional: He is oriented to person, place, and time. He appears well-developed and well-nourished. No distress.  HENT:  Head: Normocephalic and atraumatic.  Mouth/Throat: Oropharynx is clear and moist. No oropharyngeal exudate.  Eyes: EOM are normal. Pupils are equal, round, and reactive to light.  Neck: Neck supple.  Cardiovascular: Regular rhythm and normal heart sounds.   No murmur heard. Pulmonary/Chest: Effort normal and breath sounds normal. No respiratory distress. He has no wheezes. He has no rales.  Abdominal: There is no tenderness.  Musculoskeletal: He exhibits tenderness. He exhibits no edema.  No TTP hip and knee, no edema knees bilaterally, no malleoli tenderness bilaterally, mild tenderness right lateral lower extremity (fibula), no collarbone tenderness, lateral distal shoulder tenderness, mild tenderness with abduction, no elbow tenderness, full ROM shoulder  Neurological: He is alert and oriented to person, place, and time. No cranial nerve deficit. Coordination normal.  No arm drift, equal strength bilaterally 5+ lower extremities, normal FNF test  Skin: Skin is warm and dry. No rash noted.  superficial laceration right ankle 1cm; another one adjacent to that 1cm  Psychiatric: He has a normal  mood and affect. His behavior is normal.    ED Course  Procedures (including critical care time) LACERATION REPAIR Performed by: Mariea Clonts Authorized by: Mariea Clonts Consent: Verbal consent obtained. Risks and benefits: risks, benefits and alternatives were discussed Consent given by: patient Patient identity confirmed: provided demographic data Prepped and Draped in normal sterile fashion Wound explored  Laceration Location: right lateral lower leg Laceration Length: 1 cm No Foreign Bodies seen or palpated dermabond Amount of cleaning: standard  approximated  Patient tolerance: Patient tolerated the procedure well with no immediate complications.   DIAGNOSTIC STUDIES: Oxygen Saturation is 96% on RA, adequate by my interpretation.    COORDINATION OF CARE: 8:06 AM Dermabond applied to the right distal lateral leg.   Labs Review   Labs Reviewed  PROTIME-INR - Abnormal; Notable for the following:    Prothrombin Time  30.1 (*)    INR 2.87 (*)    All other components within normal limits  CBC - Abnormal; Notable for the following:    Hemoglobin 11.9 (*)    HCT 36.7 (*)    MCV 73.3 (*)    MCH 23.8 (*)    All other components within normal limits    Imaging Review No results found. Dg Shoulder Right  02/22/2014   CLINICAL DATA:  Fall, right shoulder pain  EXAM: RIGHT SHOULDER - 2+ VIEW  COMPARISON:  None.  FINDINGS: Three views of the right shoulder submitted. No acute fracture or subluxation. Mild spurring of humeral head. Moderate degenerative changes AC joint. Spurring of acromion. Mild narrowing of glenohumeral joint space.  IMPRESSION: No acute fracture or subluxation. Mild degenerative changes as described above.   Electronically Signed   By: Lahoma Crocker M.D.   On: 02/22/2014 08:44   Dg Tibia/fibula Right  02/22/2014   CLINICAL DATA:  Pain distal tibia fibula post fall this morning  EXAM: RIGHT TIBIA AND FIBULA - 2 VIEW  COMPARISON:  None.  FINDINGS:  Two views of the right tibia-fibula submitted. No acute fracture or subluxation. No radiopaque foreign body. No periosteal reaction or bony erosion.  IMPRESSION: Negative.   Electronically Signed   By: Lahoma Crocker M.D.   On: 02/22/2014 08:44   Ct Head Wo Contrast  02/22/2014   CLINICAL DATA:  Fall  EXAM: CT HEAD WITHOUT CONTRAST  TECHNIQUE: Contiguous axial images were obtained from the base of the skull through the vertex without intravenous contrast.  COMPARISON:  06/24/2013  FINDINGS: No skull fracture is noted. No intracranial hemorrhage, mass effect or midline shift. Mild cerebral atrophy. Mucous retention cyst in medial aspect of the right maxillary sinus measures 1.3 cm. Ventricular size is stable from prior exam. No acute cortical infarction. No mass lesion is noted on this unenhanced scan. No intraventricular hemorrhage.  IMPRESSION: No acute intracranial abnormality.  Mild cerebral atrophy.   Electronically Signed   By: Lahoma Crocker M.D.   On: 02/22/2014 08:54    EKG Interpretation None      MDM   Final diagnoses:  Leg laceration, right, initial encounter  Fall, initial encounter  Acute head injury, initial encounter  Right shoulder strain, initial encounter  Anticoagulant long-term use   I personally performed the services described in this documentation, which was scribed in my presence. The recorded information has been reviewed and is accurate.  Mechanical fall. Well appearing, normal neuro exam.  Xrays reviewed no acute fx. Small laceration repaired dermabond.  Results and differential diagnosis were discussed with the patient/parent/guardian. Close follow up outpatient was discussed, comfortable with the plan.   Medications  HYDROcodone-acetaminophen (NORCO/VICODIN) 5-325 MG per tablet 1 tablet (1 tablet Oral Given 02/22/14 0813)    Filed Vitals:   02/22/14 0747 02/22/14 1008  BP: 136/91 124/94  Pulse: 106 85  Temp: 97.7 F (36.5 C) 98.1 F (36.7 C)  TempSrc: Oral  Oral  Resp: 17 18  Height: 5\' 10"  (1.778 m)   Weight: 160 lb (72.576 kg)   SpO2: 96% 97%    Final diagnoses:  Leg laceration, right, initial encounter  Fall, initial encounter  Acute head injury, initial encounter  Right shoulder strain, initial encounter  Anticoagulant long-term use      Mariea Clonts, MD 02/24/14 1116

## 2014-02-22 NOTE — ED Notes (Addendum)
Pt stated he slipped on the bath mat when getting into the shower.  Pt denies dizziness or LOC before fall.  Pt denies dizziness or LOC after fall.  Pt denies chest pain.  Pt states he fell hitting his rt shoulder, rt lower extremity and the back of his head.  Pt has been on Coumadin for 10 years.  Pt had arthroscopic surgery on his rt shoulder in early July.  Pt A&O x4 at time of assessment.  NAD noted.

## 2014-02-22 NOTE — ED Notes (Signed)
Small laceration noted to right lateral side of RLE. Mild active bleeding noted. Site cleaned with sterile water and moistened gauze. Pt tolerated well. EDP at bedside applied dermabond to site.

## 2014-02-22 NOTE — ED Notes (Signed)
Pt fell in shower this morning, pt states hurt rt shoulder, rt leg (laceration to rt ankle, pt on coumadin), and rt side of head. Pt denies LOC but did have dizziness after fall, denies dizziness at triage.

## 2014-02-22 NOTE — Discharge Instructions (Signed)
Followup with your physician for reassessment and recheck of INR. Return if you develop severe headache, persistent vomiting, confusion or other symptoms. Keep wound clean and dry, ED and shower normally after 24 hours. Return for signs of infection. For severe pain take norco or vicodin however realize they have the potential for addiction and it can make you sleepy and has tylenol in it.  No operating machinery while taking.  If you were given medicines take as directed.  If you are on coumadin or contraceptives realize their levels and effectiveness is altered by many different medicines.  If you have any reaction (rash, tongues swelling, other) to the medicines stop taking and see a physician.   Please follow up as directed and return to the ER or see a physician for new or worsening symptoms.  Thank you. Filed Vitals:   02/22/14 0747  BP: 136/91  Pulse: 106  Temp: 97.7 F (36.5 C)  TempSrc: Oral  Resp: 17  Height: 5\' 10"  (1.778 m)  Weight: 160 lb (72.576 kg)  SpO2: 96%

## 2015-08-19 ENCOUNTER — Emergency Department (HOSPITAL_COMMUNITY)
Admission: EM | Admit: 2015-08-19 | Discharge: 2015-08-19 | Disposition: A | Discharge status: Home / Self Care | Payer: Non-veteran care | Attending: Emergency Medicine | Admitting: Emergency Medicine

## 2015-08-19 ENCOUNTER — Emergency Department (HOSPITAL_COMMUNITY): Payer: Non-veteran care

## 2015-08-19 ENCOUNTER — Encounter (HOSPITAL_COMMUNITY): Payer: Self-pay | Admitting: Cardiology

## 2015-08-19 DIAGNOSIS — Z85038 Personal history of other malignant neoplasm of large intestine: Secondary | ICD-10-CM | POA: Diagnosis not present

## 2015-08-19 DIAGNOSIS — R55 Syncope and collapse: Secondary | ICD-10-CM | POA: Diagnosis not present

## 2015-08-19 DIAGNOSIS — R0602 Shortness of breath: Secondary | ICD-10-CM | POA: Diagnosis not present

## 2015-08-19 DIAGNOSIS — Z792 Long term (current) use of antibiotics: Secondary | ICD-10-CM | POA: Diagnosis not present

## 2015-08-19 DIAGNOSIS — R197 Diarrhea, unspecified: Secondary | ICD-10-CM | POA: Insufficient documentation

## 2015-08-19 DIAGNOSIS — I10 Essential (primary) hypertension: Secondary | ICD-10-CM | POA: Insufficient documentation

## 2015-08-19 DIAGNOSIS — Z79899 Other long term (current) drug therapy: Secondary | ICD-10-CM | POA: Insufficient documentation

## 2015-08-19 DIAGNOSIS — E785 Hyperlipidemia, unspecified: Secondary | ICD-10-CM | POA: Diagnosis not present

## 2015-08-19 DIAGNOSIS — Z87891 Personal history of nicotine dependence: Secondary | ICD-10-CM | POA: Insufficient documentation

## 2015-08-19 DIAGNOSIS — Z7901 Long term (current) use of anticoagulants: Secondary | ICD-10-CM | POA: Diagnosis not present

## 2015-08-19 HISTORY — DX: Cerebral infarction, unspecified: I63.9

## 2015-08-19 LAB — COMPREHENSIVE METABOLIC PANEL
ALT: 41 U/L (ref 17–63)
ANION GAP: 10 (ref 5–15)
AST: 29 U/L (ref 15–41)
Albumin: 4.1 g/dL (ref 3.5–5.0)
Alkaline Phosphatase: 49 U/L (ref 38–126)
BUN: 36 mg/dL — ABNORMAL HIGH (ref 6–20)
CHLORIDE: 107 mmol/L (ref 101–111)
CO2: 23 mmol/L (ref 22–32)
Calcium: 9.4 mg/dL (ref 8.9–10.3)
Creatinine, Ser: 2.2 mg/dL — ABNORMAL HIGH (ref 0.61–1.24)
GFR calc non Af Amer: 30 mL/min — ABNORMAL LOW (ref >60)
GFR, EST AFRICAN AMERICAN: 34 mL/min — AB (ref >60)
Glucose, Bld: 100 mg/dL — ABNORMAL HIGH (ref 65–99)
Potassium: 4.3 mmol/L (ref 3.5–5.1)
SODIUM: 140 mmol/L (ref 135–145)
Total Bilirubin: 0.5 mg/dL (ref 0.3–1.2)
Total Protein: 8.3 g/dL — ABNORMAL HIGH (ref 6.5–8.1)

## 2015-08-19 LAB — CBC WITH DIFFERENTIAL/PLATELET
Basophils Absolute: 0 10*3/uL (ref 0.0–0.1)
Basophils Relative: 0 %
EOS ABS: 0.1 10*3/uL (ref 0.0–0.7)
EOS PCT: 1 %
HCT: 37.2 % — ABNORMAL LOW (ref 39.0–52.0)
Hemoglobin: 11.8 g/dL — ABNORMAL LOW (ref 13.0–17.0)
LYMPHS ABS: 1.3 10*3/uL (ref 0.7–4.0)
Lymphocytes Relative: 16 %
MCH: 23.6 pg — AB (ref 26.0–34.0)
MCHC: 31.7 g/dL (ref 30.0–36.0)
MCV: 74.3 fL — ABNORMAL LOW (ref 78.0–100.0)
MONOS PCT: 5 %
Monocytes Absolute: 0.4 10*3/uL (ref 0.1–1.0)
Neutro Abs: 6.1 10*3/uL (ref 1.7–7.7)
Neutrophils Relative %: 78 %
PLATELETS: 256 10*3/uL (ref 150–400)
RBC: 5.01 MIL/uL (ref 4.22–5.81)
RDW: 16.4 % — ABNORMAL HIGH (ref 11.5–15.5)
WBC: 7.9 10*3/uL (ref 4.0–10.5)

## 2015-08-19 LAB — TROPONIN I

## 2015-08-19 LAB — PROTIME-INR
INR: 2.93 — AB (ref 0.00–1.49)
Prothrombin Time: 30.1 seconds — ABNORMAL HIGH (ref 11.6–15.2)

## 2015-08-19 MED ORDER — SODIUM CHLORIDE 0.9 % IV BOLUS (SEPSIS)
1000.0000 mL | Freq: Once | INTRAVENOUS | Status: AC
  Administered 2015-08-19: 1000 mL via INTRAVENOUS

## 2015-08-19 MED ORDER — MECLIZINE HCL 25 MG PO TABS: 25.0000 mg | ORAL_TABLET | Freq: Three times a day (TID) | ORAL | Status: DC | PRN

## 2015-08-19 NOTE — Discharge Instructions (Signed)
Please obtain all of your results from medical records or have your doctors office obtain the results - share them with your doctor - you should be seen at your doctors office in the next 2 days. Call today to arrange your follow up. Take the medications as prescribed. Please review all of the medicines and only take them if you do not have an allergy to them. Please be aware that if you are taking birth control pills, taking other prescriptions, ESPECIALLY ANTIBIOTICS may make the birth control ineffective - if this is the case, either do not engage in sexual activity or use alternative methods of birth control such as condoms until you have finished the medicine and your family doctor says it is OK to restart them. If you are on a blood thinner such as COUMADIN, be aware that any other medicine that you take may cause the coumadin to either work too much, or not enough - you should have your coumadin level rechecked in next 7 days if this is the case.  °?  °It is also a possibility that you have an allergic reaction to any of the medicines that you have been prescribed - Everybody reacts differently to medications and while MOST people have no trouble with most medicines, you may have a reaction such as nausea, vomiting, rash, swelling, shortness of breath. If this is the case, please stop taking the medicine immediately and contact your physician.  °?  °You should return to the ER if you develop severe or worsening symptoms.  ° °[facility] Doctor List ° ° ° °Edward Hawkins MD. Specialty: Pulmonary Disease Contact information: [address]  °[address]  °[place] [address]  °[phone]  ° °Margaret Simpson, MD. Specialty: Family Medicine Contact information: [address]  °[place] [address]  °[phone]  ° °Scott Luking, MD. Specialty: Family Medicine Contact information: [address]  °[address]  °[place] [address]  °[phone]  ° °Tesfaye Fanta, MD Specialty:  Internal Medicine Contact information: [address]  °[place] [address]  °[phone]  ° °Zach Hall, MD. Specialty: Internal Medicine Contact information: [address]  °[place] [address]  °[phone]  ° °Angus Mcinnis, MD. Specialty: Family Medicine Contact information: [address]  °[place] [address]  °[phone]  ° °Stephen Knowlton, MD. Specialty: Family Medicine Contact information: [address]  °[address]  °[place] [address]  °[phone]  ° °Roy Fagan, MD. Specialty: Internal Medicine Contact information: [address]  °[address]  ° [place] [address]  °[phone]  ° °

## 2015-08-19 NOTE — ED Notes (Signed)
Pt no distress.

## 2015-08-19 NOTE — ED Notes (Signed)
At physical therapy this morning and had a near syncopal episode.  Per staff they could not get a blood pressure.  EMS states after their arrival pt had some chest pain.  States he also had some chest pain last night with dizziness.

## 2015-08-19 NOTE — ED Provider Notes (Signed)
CSN: [id number]     Arrival date & time 08/19/15  1149 History  By signing my name below, I, Jolayne Panther, attest that this documentation has been prepared under the direction and in the presence of Noemi Chapel, MD. Electronically Signed: Jolayne Panther, Scribe. 08/19/2015. 12:12 PM.   Chief Complaint  Patient presents with  . Near Syncope   The history is provided by the patient. No language interpreter was used.   HPI Comments: AUBRA MCKEONE is a 65 y.o. male, with a hx of HTN, hypercholesterolemia, neck surgery for the removal of bone spurs, and thyroid removal, who presents to the Emergency Department complaining of sudden onset of SOB and dizzniess last night when he woke up, about ten hours ago, at 2 AM. Pt states that he was not experiencing any sort of symptoms when he initially went to bed and also notes associated tingling in his hands and his right side when he woke up, as well as loss of appetite and a fever, which he had about two days ago. Pt also reports intermittent diarrhea, but notes that he has chronic pancreatitis. He denies any current pain around his pancrease at the moment. Pt reports that he was at physical therapy for his neck this morning, when he experienced dizziness, lightheadedness and a near-syncopal episode while moving his neck and receiving a neck massage. He states that his dizziness is currently improved but is still present. Pt has experienced dizziness in the past and notes that he would just sit down until it would resolve. Pt denies cough or fever today, and also denies recent new medications, head injuries, actual LOC, hx of DM, cigarette use, and alcohol consumption. 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   Past Medical History  Diagnosis Date  . Colon cancer ([place])   . Pancreatitis   . Hypertension   . Hyperlipidemia   . Colon polyps    . Stroke [facility])    Past Surgical History  Procedure Laterality Date  . Cholecystectomy    . Neck surgery    . Thyroid surgery    . Shoulder arthroscopy Right December 07 2013   Family History  Problem Relation Age of Onset  . Colon cancer Mother   . Colon cancer Sister   . Colon cancer Brother    Social History  Substance Use Topics  . Smoking status: Former Smoker    Types: Cigarettes    Quit date: 01/28/1982  . Smokeless tobacco: None  . Alcohol Use: No    Review of Systems  Constitutional: Positive for appetite change. Negative for fever.  Respiratory: Positive for shortness of breath. Negative for cough.   Gastrointestinal: Positive for diarrhea.  Neurological: Positive for dizziness and light-headedness.       Tingling in hands Near-syncope  All other systems reviewed and are negative.  Allergies  Aspirin  Home Medications   Prior to Admission medications   Medication Sig Start Date End Date Taking? Authorizing Provider  acetaminophen (TYLENOL) 500 MG tablet Take 1,000 mg by mouth daily as needed.   Yes Historical Provider, MD  atenolol (TENORMIN) 25 MG tablet Take 25 mg by mouth daily.   Yes Historical Provider, MD  benzoyl peroxide 10 % gel Apply 1 application topically daily.   Yes Historical Provider, MD  carboxymethylcellulose 1 % ophthalmic solution Apply 1 drop to  eye 4 (four) times daily as needed (for eye irritation).   Yes Historical Provider, MD  clindamycin (CLEOCIN) 75 MG/5ML solution Take by mouth 2 (two) times daily. Apply to shoulders, chest, stomach, and back   Yes Historical Provider, MD  finasteride (PROSCAR) 5 MG tablet Take 5 mg by mouth daily.   Yes Historical Provider, MD  hydrochlorothiazide (HYDRODIURIL) 25 MG tablet Take 25 mg by mouth daily.   Yes Historical Provider, MD  levothyroxine (SYNTHROID, LEVOTHROID) 50 MCG tablet Take 50 mcg by mouth daily.   Yes Historical Provider, MD  lipase/protease/amylase (CREON-12/PANCREASE) 12000 UNITS CPEP  capsule Take 2 capsules by mouth 3 (three) times daily before meals.   Yes Historical Provider, MD  lisinopril (PRINIVIL,ZESTRIL) 40 MG tablet Take 20 mg by mouth daily.   Yes Historical Provider, MD  omeprazole (PRILOSEC) 20 MG capsule Take 20 mg by mouth daily.   Yes Historical Provider, MD  simvastatin (ZOCOR) 80 MG tablet Take 80 mg by mouth daily.    Yes Historical Provider, MD  sodium chloride (OCEAN) 0.65 % SOLN nasal spray Place 1 spray into both nostrils as needed for congestion.   Yes Historical Provider, MD  triamcinolone cream (KENALOG) 0.1 % Apply 1 application topically 2 (two) times daily. Reported on 08/19/2015   Yes Historical Provider, MD  warfarin (COUMADIN) 5 MG tablet Take 5-7.5 mg by mouth every evening. 1 tablet every day except Mondays and Fridays. Takes 1.5 tablets on Monday and Friday.   Yes Historical Provider, MD  coal tar-salicylic acid 2 % shampoo Apply 1 application topically 3 (three) times a week. Reported on 08/19/2015    Historical Provider, MD  meclizine (ANTIVERT) 25 MG tablet Take 1 tablet (25 mg total) by mouth 3 (three) times daily as needed for dizziness. 08/19/15   Noemi Chapel, MD  ondansetron (ZOFRAN) 8 MG tablet Take by mouth every 8 (eight) hours as needed. Reported on 08/19/2015    Historical Provider, MD   BP 124/79 mmHg  Pulse 79  Temp(Src) 97.8 F (36.6 C) (Oral)  Resp 13  Ht 5\' 10"  (1.778 m)  Wt 160 lb (72.576 kg)  BMI 22.96 kg/m2  SpO2 100% Physical Exam  Constitutional: He appears well-developed and well-nourished. No distress.  HENT:  Head: Normocephalic and atraumatic.  Mouth/Throat: Oropharynx is clear and moist. No oropharyngeal exudate.  Eyes: Conjunctivae and EOM are normal. Pupils are equal, round, and reactive to light. Right eye exhibits no discharge. Left eye exhibits no discharge. No scleral icterus.  Slight ptosis on the right eye   Neck: Normal range of motion. Neck supple. No JVD present. No thyromegaly present.   Cardiovascular: Normal rate, regular rhythm, normal heart sounds and intact distal pulses.  Exam reveals no gallop and no friction rub.   No murmur heard. Pulmonary/Chest: Effort normal and breath sounds normal. No respiratory distress. He has no wheezes. He has no rales.  Abdominal: Soft. Bowel sounds are normal. He exhibits no distension and no mass. There is no tenderness.  Musculoskeletal: Normal range of motion. He exhibits no edema or tenderness.  Lymphadenopathy:    He has no cervical adenopathy.  Neurological: He is alert. Coordination normal.  No nystagmus, but is resistant to turning his head. Neurologic exam:  Speech clear, pupils equal round reactive to light, extraocular movements intact  Normal peripheral visual fields Cranial nerves III through XII normal including no facial droop Follows commands, moves all extremities x4, normal strength to bilateral upper and lower extremities at all major  muscle groups including grip Sensation normal to light touch and pinprick Coordination intact, no limb ataxia, finger-nose-finger normal Rapid alternating movements normal No pronator drift Gait normal   Skin: Skin is warm and dry. No rash noted. No erythema.  Psychiatric: He has a normal mood and affect. His behavior is normal.  Nursing note and vitals reviewed.   ED Course  Procedures  DIAGNOSTIC STUDIES:    Oxygen Saturation is 100% on RA, normal by my interpretation.   COORDINATION OF CARE:  11:52 AM Will order labs, EKG, and chest x-ray.  Discussed treatment plan with pt at bedside and pt agreed to plan.   Labs Review Labs Reviewed  CBC WITH DIFFERENTIAL/PLATELET - Abnormal; Notable for the following:    Hemoglobin 11.8 (*)    HCT 37.2 (*)    MCV 74.3 (*)    MCH 23.6 (*)    RDW 16.4 (*)    All other components within normal limits  COMPREHENSIVE METABOLIC PANEL - Abnormal; Notable for the following:    Glucose, Bld 100 (*)    BUN 36 (*)    Creatinine, Ser  2.20 (*)    Total Protein 8.3 (*)    GFR calc non Af Amer 30 (*)    GFR calc Af Amer 34 (*)    All other components within normal limits  PROTIME-INR - Abnormal; Notable for the following:    Prothrombin Time 30.1 (*)    INR 2.93 (*)    All other components within normal limits  TROPONIN I   Imaging Review Dg Chest 2 View  08/19/2015  CLINICAL DATA:  Syncope and chest pain EXAM: CHEST  2 VIEW COMPARISON:  January 24, 2009 FINDINGS: There is no edema or consolidation. Heart size and pulmonary vascularity are normal. No adenopathy. There is upper thoracic levoscoliosis. There are surgical clips at the anterior cervical -thoracic junction. No pneumothorax. IMPRESSION: No edema or consolidation. Electronically Signed   By: Lowella Grip III M.D.   On: 08/19/2015 13:27   I have personally reviewed and evaluated these images and lab results as part of my medical decision-making.   EKG Interpretation   Date/Time:  Monday August 19 2015 11:52:34 EDT Ventricular Rate:  85 PR Interval:  162 QRS Duration: 94 QT Interval:  347 QTC Calculation: 413 R Axis:   77 Text Interpretation:  Sinus rhythm since last tracing no significant  change Confirmed by Adriannah Steinkamp  MD, Nevah Dalal ([id number]) on 08/19/2015 12:06:54 PM     MDM   Final diagnoses:  Near syncope    I personally performed the services described in this documentation, which was scribed in my presence. The recorded information has been reviewed and is accurate.    The patient states that he still has some symptoms of vertigo when he stands up too quickly, he does not have any orthostatic changes, he has been given a small fluid bolus, his vital signs remained in a normal range and of note the patient still has no findings of neurologic dysfunction exam. His vertigo seems to be very reproducible with any head motion, I do not think that he has an acute stroke. The patient will be given meclizine and encouraged to follow up very closely.  Labs  show that the patient had minimal anemia with a hemoglobin of 11.8, creatinine was 2.2, baseline is between 1.6 and 2, his BUN was slightly elevated, he was given IV fluid for hydration.  The patient is in agreement with the plan for discharge and  understands indications for return including worsening neurologic findings, he is agreeable to the verbal discharge instructions and agrees to return should these things occur  Meds given in ED:  Medications  sodium chloride 0.9 % bolus 1,000 mL (1,000 mLs Intravenous New Bag/Given 08/19/15 1421)    New Prescriptions   MECLIZINE (ANTIVERT) 25 MG TABLET    Take 1 tablet (25 mg total) by mouth 3 (three) times daily as needed for dizziness.      Noemi Chapel, MD 08/19/15 [phone]

## 2015-09-18 IMAGING — CT CT HEAD W/O CM
4 of 6 series · 16 of 47 positions shown, 18 images · non-contrast
Comparison: CT NECK W/CM dated 07/01/2005;

CLINICAL DATA: Trauma patient on anti coagulation therapy

EXAM:
CT HEAD WITHOUT CONTRAST
CT CERVICAL SPINE WITHOUT CONTRAST
TECHNIQUE: Multidetector CT imaging of the head and cervical spine was
performed following the standard protocol without intravenous
contrast. Multiplanar CT image reconstructions of the cervical spine
were also generated.

[Series 2: head w/o · axial · non-contrast · 0.49mm/px · z∈[+234,+284]mm · 2 of 32 slices shown]
[im 11/32  brain]
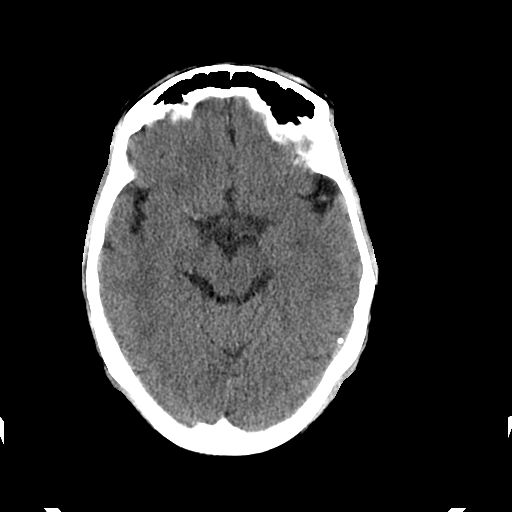
[im 21/32  brain]
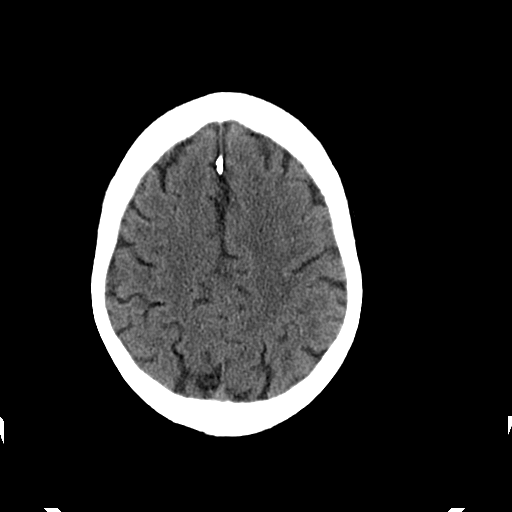

[Series 5: soft tissue · axial · 0.30mm/px · z∈[+42,+206]mm · 8 of 106 slices shown, 10 images]
[im 12/106  brain]
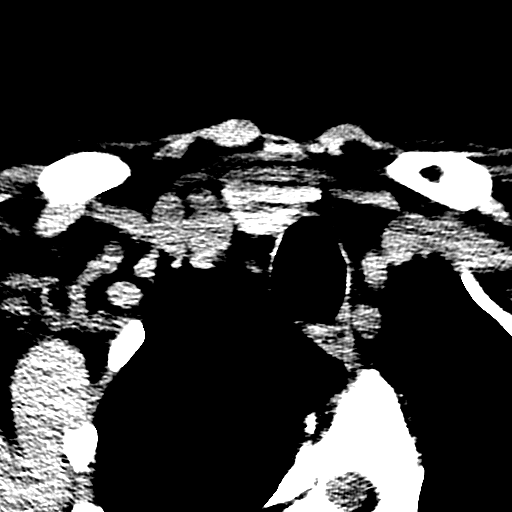
[im 12/106  bone]
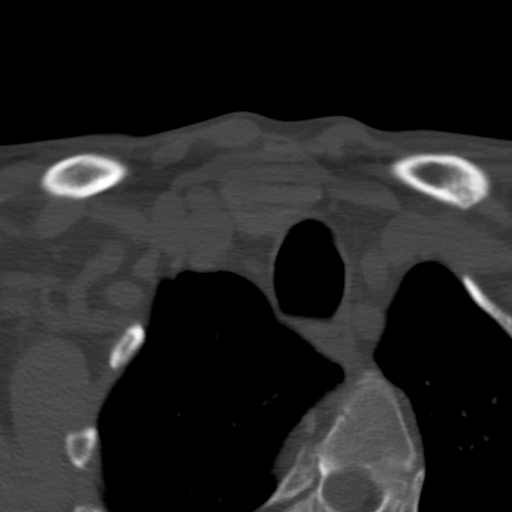
[im 24/106  brain]
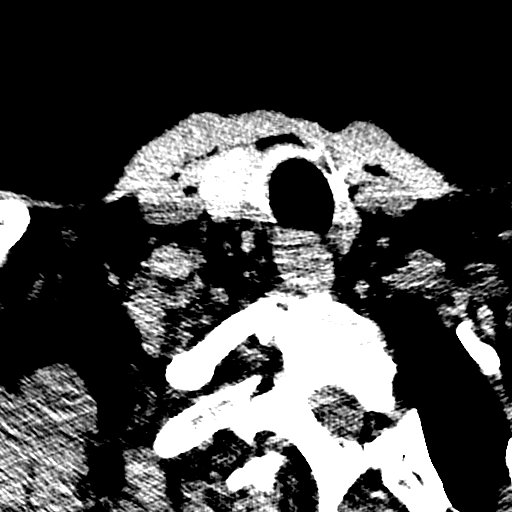
[im 36/106  brain]
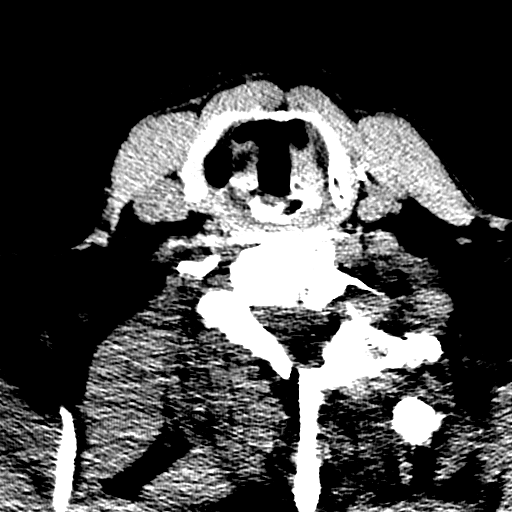
[im 47/106  brain]
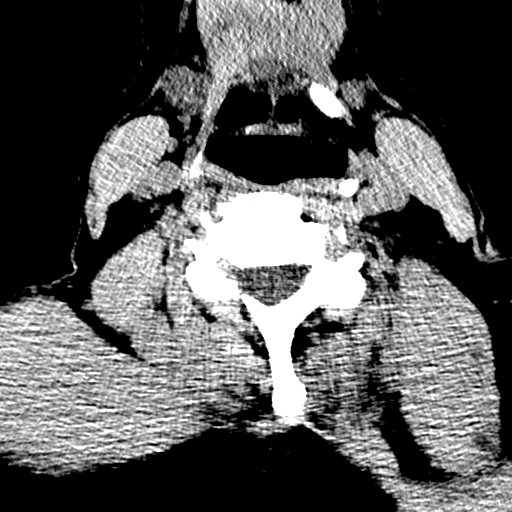
[im 59/106  brain]
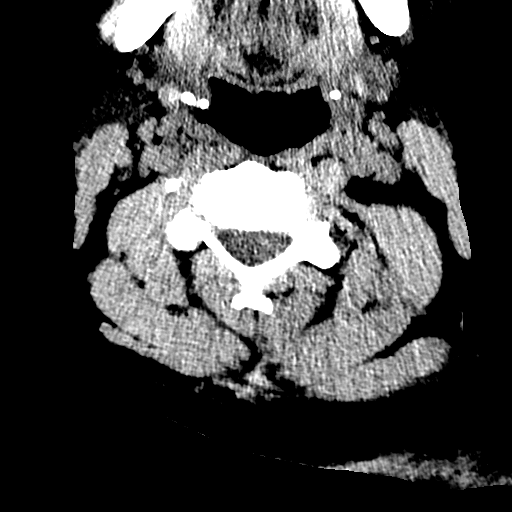
[im 59/106  bone]
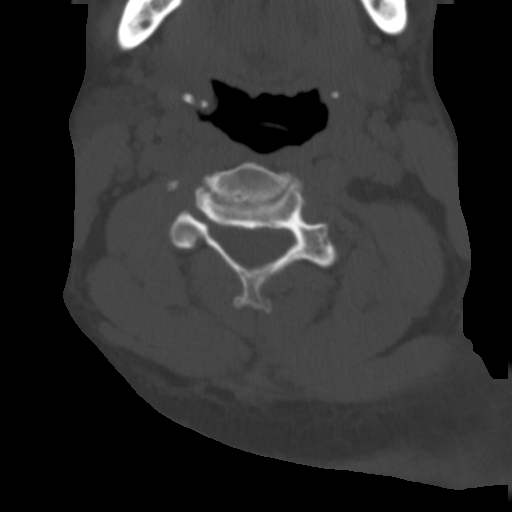
[im 71/106  brain]
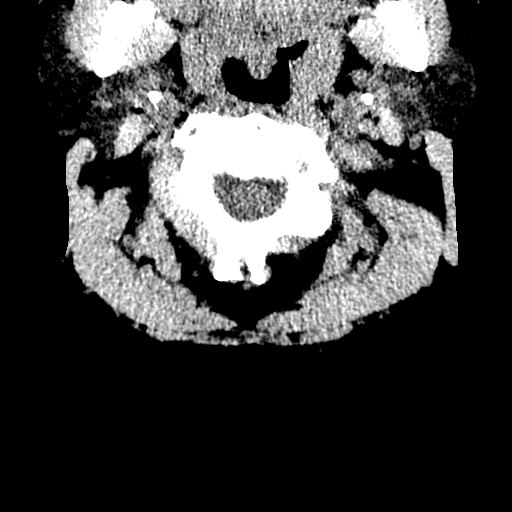
[im 82/106  brain]
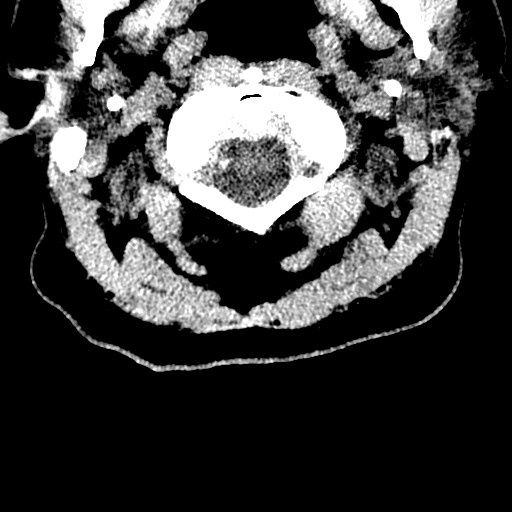
[im 94/106  brain]
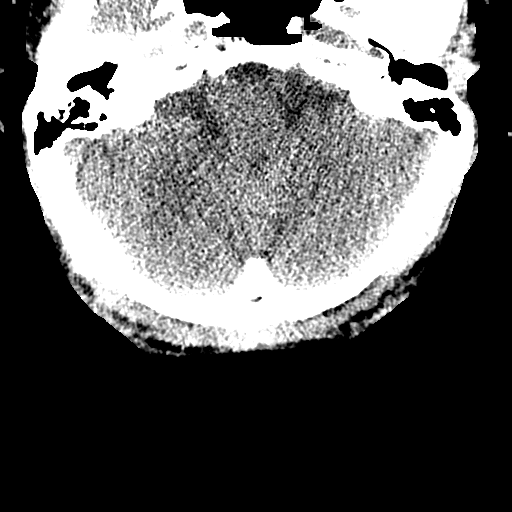

[mpr, sagittal, sagittal · sagittal · 0.41mm/px · 3 of 49 slices shown]
[im 17/49  brain]
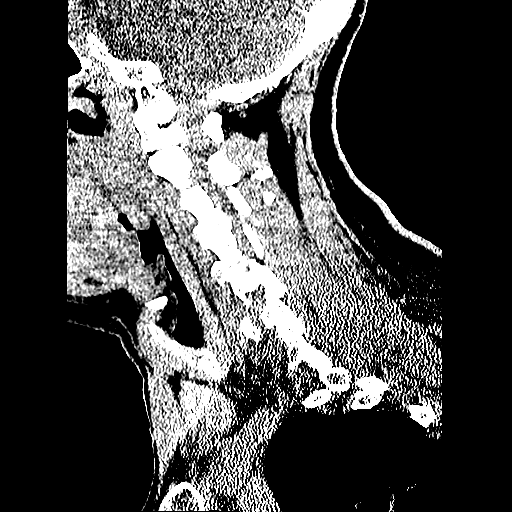
[im 25/49  brain]
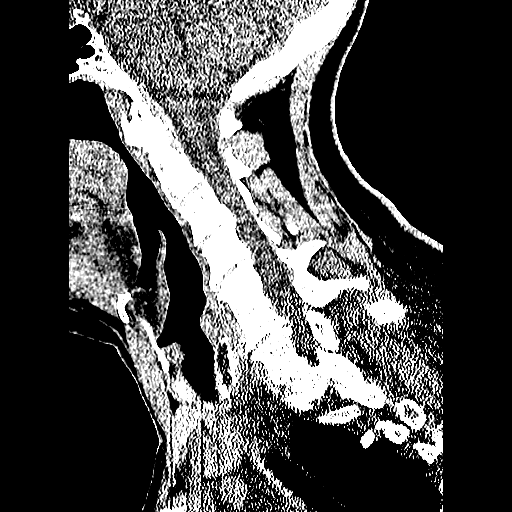
[im 33/49  brain]
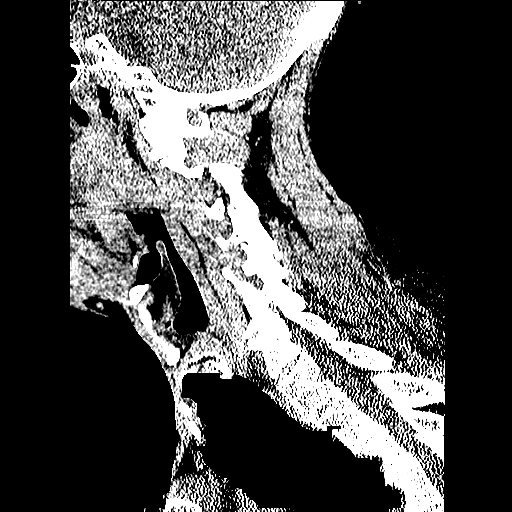

[mpr, coronal, coronal · coronal · 0.41mm/px · 3 of 52 slices shown]
[im 18/52  brain]
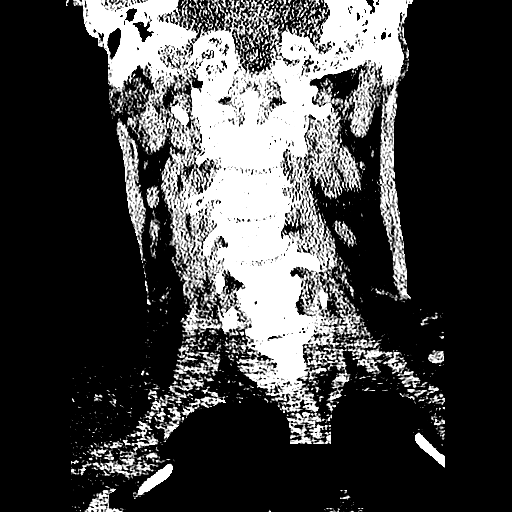
[im 23/52  brain]
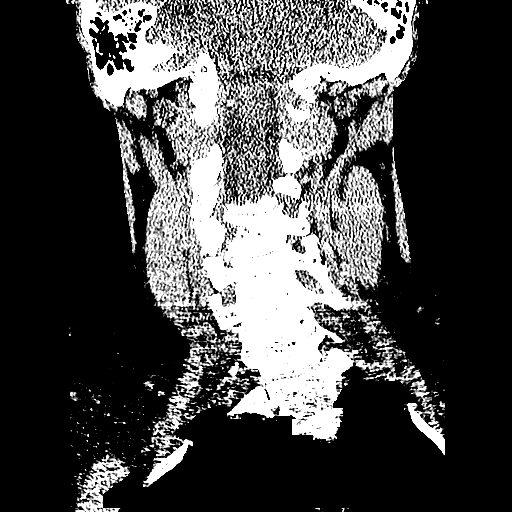
[im 29/52  brain]
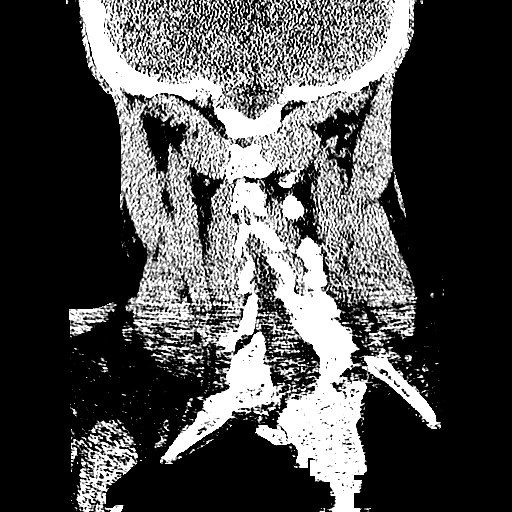

[16 of 47 positions shown; findings below may reference images not displayed]

US ASPIRATION dated
01/04/2004; US SOFT TISSUE HEAD/NECK dated 01/04/2004; CT HEAD W/O CM
dated 12/28/2003; MR MRA HEAD W/O CM dated 12/28/2003; MR HEAD WO/W CM
dated 12/28/2003
FINDINGS: CT HEAD FINDINGS

No intracranial hemorrhage. No parenchymal contusion. No midline
shift or mass effect. Basilar cisterns are patent. No skull base
fracture. No fluid in the paranasal sinuses or mastoid air cells.
There is mild periventricular white matter hypodensities. Mild
atrophy. Mild polypoid mucosal thickening in the maxillary sinuses.
Frontal sinuses are clear.

CT CERVICAL SPINE FINDINGS

No prevertebral soft tissue swelling. Normal alignment of cervical
vertebral bodies. No loss of vertebral body height. Normal facet
articulation. Normal craniocervical junction.

No evidence epidural or paraspinal hematoma.

There is multiple levels of endplate spurring and joint space
narrowing. There is fusion of the C5 and C6 vertebral bodies.

Partial resection of the thyroid gland with the left
hemithyroidectomy. . There is some nodularity of the right gland
cm. This appears similar to comparison ultrasound 01/04/2004.
IMPRESSION: 1. No acute intracranial trauma.
2. Mild microvascular disease.
3. No cervical spine fracture.
4. Multilevel disc osteophytic disease of the strap cervical spine.
5. Left hemithyroidectomy. Nodule within the right lobe of thyroid
gland appears stable.

## 2021-01-04 ENCOUNTER — Encounter (HOSPITAL_COMMUNITY): Payer: Self-pay | Admitting: Emergency Medicine

## 2021-01-04 ENCOUNTER — Emergency Department (HOSPITAL_COMMUNITY): Payer: Non-veteran care

## 2021-01-04 ENCOUNTER — Other Ambulatory Visit: Payer: Self-pay

## 2021-01-04 ENCOUNTER — Observation Stay (HOSPITAL_COMMUNITY)
Admission: EM | Admit: 2021-01-04 | Discharge: 2021-01-05 | Disposition: A | Discharge status: Home / Self Care | Payer: Non-veteran care | Attending: Internal Medicine | Admitting: Internal Medicine

## 2021-01-04 DIAGNOSIS — Z85038 Personal history of other malignant neoplasm of large intestine: Secondary | ICD-10-CM | POA: Insufficient documentation

## 2021-01-04 DIAGNOSIS — Z87891 Personal history of nicotine dependence: Secondary | ICD-10-CM | POA: Insufficient documentation

## 2021-01-04 DIAGNOSIS — Z8673 Personal history of transient ischemic attack (TIA), and cerebral infarction without residual deficits: Secondary | ICD-10-CM | POA: Insufficient documentation

## 2021-01-04 DIAGNOSIS — E039 Hypothyroidism, unspecified: Secondary | ICD-10-CM

## 2021-01-04 DIAGNOSIS — Z79899 Other long term (current) drug therapy: Secondary | ICD-10-CM | POA: Insufficient documentation

## 2021-01-04 DIAGNOSIS — N1832 Chronic kidney disease, stage 3b: Secondary | ICD-10-CM | POA: Diagnosis present

## 2021-01-04 DIAGNOSIS — I129 Hypertensive chronic kidney disease with stage 1 through stage 4 chronic kidney disease, or unspecified chronic kidney disease: Secondary | ICD-10-CM | POA: Insufficient documentation

## 2021-01-04 DIAGNOSIS — I10 Essential (primary) hypertension: Secondary | ICD-10-CM | POA: Diagnosis present

## 2021-01-04 DIAGNOSIS — R55 Syncope and collapse: Principal | ICD-10-CM | POA: Diagnosis present

## 2021-01-04 DIAGNOSIS — Z7901 Long term (current) use of anticoagulants: Secondary | ICD-10-CM | POA: Insufficient documentation

## 2021-01-04 DIAGNOSIS — R4020 Unspecified coma: Secondary | ICD-10-CM

## 2021-01-04 DIAGNOSIS — I81 Portal vein thrombosis: Secondary | ICD-10-CM

## 2021-01-04 DIAGNOSIS — R42 Dizziness and giddiness: Secondary | ICD-10-CM

## 2021-01-04 DIAGNOSIS — N183 Chronic kidney disease, stage 3 unspecified: Secondary | ICD-10-CM | POA: Diagnosis present

## 2021-01-04 HISTORY — DX: Syncope and collapse: R55

## 2021-01-04 LAB — CBC WITH DIFFERENTIAL/PLATELET
Abs Immature Granulocytes: 0.01 10*3/uL (ref 0.00–0.07)
Basophils Absolute: 0 10*3/uL (ref 0.0–0.1)
Basophils Relative: 0 %
Eosinophils Absolute: 0.3 10*3/uL (ref 0.0–0.5)
Eosinophils Relative: 5 %
HCT: 36.1 % — ABNORMAL LOW (ref 39.0–52.0)
Hemoglobin: 11.3 g/dL — ABNORMAL LOW (ref 13.0–17.0)
Immature Granulocytes: 0 %
Lymphocytes Relative: 31 %
Lymphs Abs: 2.1 10*3/uL (ref 0.7–4.0)
MCH: 24.4 pg — ABNORMAL LOW (ref 26.0–34.0)
MCHC: 31.3 g/dL (ref 30.0–36.0)
MCV: 78 fL — ABNORMAL LOW (ref 80.0–100.0)
Monocytes Absolute: 0.7 10*3/uL (ref 0.1–1.0)
Monocytes Relative: 10 %
Neutro Abs: 3.6 10*3/uL (ref 1.7–7.7)
Neutrophils Relative %: 54 %
Platelets: 221 10*3/uL (ref 150–400)
RBC: 4.63 MIL/uL (ref 4.22–5.81)
RDW: 15.5 % (ref 11.5–15.5)
WBC: 6.8 10*3/uL (ref 4.0–10.5)
nRBC: 0 % (ref 0.0–0.2)

## 2021-01-04 LAB — BASIC METABOLIC PANEL
Anion gap: 6 (ref 5–15)
BUN: 45 mg/dL — ABNORMAL HIGH (ref 8–23)
CO2: 27 mmol/L (ref 22–32)
Calcium: 9.4 mg/dL (ref 8.9–10.3)
Chloride: 108 mmol/L (ref 98–111)
Creatinine, Ser: 3.24 mg/dL — ABNORMAL HIGH (ref 0.61–1.24)
GFR, Estimated: 20 mL/min — ABNORMAL LOW (ref >60)
Glucose, Bld: 101 mg/dL — ABNORMAL HIGH (ref 70–99)
Potassium: 4.4 mmol/L (ref 3.5–5.1)
Sodium: 141 mmol/L (ref 135–145)

## 2021-01-04 LAB — MAGNESIUM: Magnesium: 2.2 mg/dL (ref 1.7–2.4)

## 2021-01-04 LAB — PROTIME-INR
INR: 1 (ref 0.8–1.2)
Prothrombin Time: 13.6 seconds (ref 11.4–15.2)

## 2021-01-04 MED ORDER — MECLIZINE HCL 25 MG PO TABS
25.0000 mg | ORAL_TABLET | Freq: Once | ORAL | Status: AC
  Administered 2021-01-04: 25 mg via ORAL
  Filled 2021-01-04: qty 1

## 2021-01-04 MED ORDER — ONDANSETRON HCL 4 MG/2ML IJ SOLN
4.0000 mg | Freq: Once | INTRAMUSCULAR | Status: AC
  Administered 2021-01-05: 4 mg via INTRAVENOUS
  Filled 2021-01-04: qty 2

## 2021-01-04 NOTE — ED Notes (Signed)
Left voicemail for mri on call

## 2021-01-04 NOTE — ED Provider Notes (Signed)
[facility] DEPT Provider Note   CSN: [id number] Arrival date & time: 01/04/21  1950     History Chief Complaint  Patient presents with   Near Syncope    [name] is a 70 y.o. male.  Presents to ER with concern for episode of passing out.  Patient states that earlier this evening he started to feel lightheaded, dizzy and then passed out.  Happened approximately an hour and a half prior to arrival.  States that he still is having some ongoing dizziness.  Slight sensation of room spinning.  Worse with movement, improved with rest.  Thinks that he hit his right hip when he fell but did not hit his head.  No nausea or vomiting.  No numbness, weakness, speech or vision change.  States he has a history of TIAs, previously took Coumadin but no longer takes this.    HPI     Past Medical History:  Diagnosis Date   Colon cancer ([place])    Colon polyps    Hyperlipidemia    Hypertension    Pancreatitis    Stroke [facility])     Patient Active Problem List   Diagnosis Date Noted   GI bleed 01/29/2012   Anemia 01/29/2012   CKD (chronic kidney disease), stage III ([place]) 01/29/2012   Chronic anticoagulation 01/29/2012   Hypertension 01/29/2012   Colon cancer ([place])    Pancreatitis    Hyperlipidemia    Colon polyps     Past Surgical History:  Procedure Laterality Date   CHOLECYSTECTOMY     NECK SURGERY     SHOULDER ARTHROSCOPY Right December 07 2013   THYROID SURGERY         Family History  Problem Relation Age of Onset   Colon cancer Mother    Colon cancer Sister    Colon cancer Brother     Social History   Tobacco Use   Smoking status: Former    Types: Cigarettes    Quit date: 01/28/1982    Years since quitting: 38.9   Smokeless tobacco: Never  Vaping Use   Vaping Use: Never used  Substance Use Topics   Alcohol use: No   Drug use: No    Home Medications Prior to Admission medications   Medication Sig Start Date End Date Taking?  Authorizing Provider  acetaminophen (TYLENOL) 500 MG tablet Take 1,000 mg by mouth daily as needed.    [provider]  atenolol (TENORMIN) 25 MG tablet Take 25 mg by mouth daily.    [provider]  benzoyl peroxide 10 % gel Apply 1 application topically daily.    [provider]  carboxymethylcellulose 1 % ophthalmic solution Apply 1 drop to eye 4 (four) times daily as needed (for eye irritation).    [provider]  clindamycin (CLEOCIN) 75 MG/5ML solution Take by mouth 2 (two) times daily. Apply to shoulders, chest, stomach, and back    [provider]  coal tar-salicylic acid 2 % shampoo Apply 1 application topically 3 (three) times a week. Reported on 08/19/2015    [provider]  finasteride (PROSCAR) 5 MG tablet Take 5 mg by mouth daily.    [provider]  hydrochlorothiazide (HYDRODIURIL) 25 MG tablet Take 25 mg by mouth daily.    [provider]  levothyroxine (SYNTHROID, LEVOTHROID) 50 MCG tablet Take 50 mcg by mouth daily.    [provider]  lipase/protease/amylase (CREON-12/PANCREASE) 12000 UNITS CPEP capsule Take 2 capsules by mouth  3 (three) times daily before meals.    [provider]  lisinopril (PRINIVIL,ZESTRIL) 40 MG tablet Take 20 mg by mouth daily.    [provider]  meclizine (ANTIVERT) 25 MG tablet Take 1 tablet (25 mg total) by mouth 3 (three) times daily as needed for dizziness. 08/19/15   Noemi Chapel, MD  omeprazole (PRILOSEC) 20 MG capsule Take 20 mg by mouth daily.    [provider]  ondansetron (ZOFRAN) 8 MG tablet Take by mouth every 8 (eight) hours as needed. Reported on 08/19/2015    [provider]  simvastatin (ZOCOR) 80 MG tablet Take 80 mg by mouth daily.     [provider]  sodium chloride (OCEAN) 0.65 % SOLN nasal spray Place 1 spray into both nostrils as needed for congestion.    [provider]  triamcinolone cream  (KENALOG) 0.1 % Apply 1 application topically 2 (two) times daily. Reported on 08/19/2015    [provider]  warfarin (COUMADIN) 5 MG tablet Take 5-7.5 mg by mouth every evening. 1 tablet every day except Mondays and Fridays. Takes 1.5 tablets on Monday and Friday.    [provider]    Allergies    Aspirin, Aspirin-acetaminophen-caffeine, and Chlorthalidone  Review of Systems   Review of Systems  Constitutional:  Negative for chills and fever.  HENT:  Negative for ear pain and sore throat.   Eyes:  Negative for pain and visual disturbance.  Respiratory:  Negative for cough and shortness of breath.   Cardiovascular:  Negative for chest pain and palpitations.  Gastrointestinal:  Negative for abdominal pain and vomiting.  Genitourinary:  Negative for dysuria and hematuria.  Musculoskeletal:  Negative for arthralgias and back pain.  Skin:  Negative for color change and rash.  Neurological:  Positive for dizziness, syncope and light-headedness. Negative for seizures.  All other systems reviewed and are negative.  Physical Exam Updated Vital Signs BP (!) 150/91   Pulse 85   Temp 98.2 F (36.8 C) (Oral)   Resp 18   Ht '5\' 10"'$  (1.778 m)   Wt 74.8 kg   SpO2 100%   BMI 23.68 kg/m   Physical Exam Vitals and nursing note reviewed.  Constitutional:      Appearance: He is well-developed.  HENT:     Head: Normocephalic and atraumatic.  Eyes:     Conjunctiva/sclera: Conjunctivae normal.  Cardiovascular:     Rate and Rhythm: Normal rate and regular rhythm.     Heart sounds: No murmur heard. Pulmonary:     Effort: Pulmonary effort is normal. No respiratory distress.     Breath sounds: Normal breath sounds.  Abdominal:     Palpations: Abdomen is soft.     Tenderness: There is no abdominal tenderness.  Musculoskeletal:     Cervical back: Neck supple.  Skin:    General: Skin is warm and dry.  Neurological:     Mental Status: He is alert.     Comments: AAOx3 CN  2-12 intact, speech clear visual fields intact 5/5 strength in b/l UE and LE Sensation to light touch intact in b/l UE and LE Normal FNF  Psychiatric:        Mood and Affect: Mood normal.    ED Results / Procedures / Treatments   Labs (all labs ordered are listed, but only abnormal results are displayed) Labs Reviewed  CBC WITH DIFFERENTIAL/PLATELET - Abnormal; Notable for the following components:      Result Value  Hemoglobin 11.3 (*)    HCT 36.1 (*)    MCV 78.0 (*)    MCH 24.4 (*)    All other components within normal limits  BASIC METABOLIC PANEL - Abnormal; Notable for the following components:   Glucose, Bld 101 (*)    BUN 45 (*)    Creatinine, Ser 3.24 (*)    GFR, Estimated 20 (*)    All other components within normal limits  MAGNESIUM  PROTIME-INR    EKG None  Radiology DG Chest 2 View  Result Date: 01/04/2021 CLINICAL DATA:  Syncopal episode today. Hypertension. EXAM: CHEST - 2 VIEW COMPARISON:  08/19/2015 FINDINGS: Heart size is normal. Both lungs are clear. Moderate levoscoliosis is again seen involving the upper thoracic spine. Surgical clips again seen at the level of the thoracic inlet. IMPRESSION: No active cardiopulmonary disease. Electronically Signed   By: Marlaine Hind M.D.   On: 01/04/2021 21:25   DG Hip Unilat W or Wo Pelvis 2-3 Views Right  Result Date: 01/04/2021 CLINICAL DATA:  Fall. Right hip pain. EXAM: DG HIP (WITH OR WITHOUT PELVIS) 2-3V RIGHT COMPARISON:  None. FINDINGS: There is no evidence of hip fracture or dislocation. There is no evidence of arthropathy. Enthesopathic changes are seen arising from the right ilium and lesser trochanter. IMPRESSION: No acute findings. Electronically Signed   By: Marlaine Hind M.D.   On: 01/04/2021 21:26    Procedures Procedures   Medications Ordered in ED Medications  ondansetron (ZOFRAN) injection 4 mg (has no administration in time range)  meclizine (ANTIVERT) tablet 25 mg (25 mg Oral Given 01/04/21 2228)     ED Course  I have reviewed the triage vital signs and the nursing notes.  Pertinent labs & imaging results that were available during my care of the patient were reviewed by me and considered in my medical decision making (see chart for details).    MDM Rules/Calculators/A&P                           70 year old male with concern for dizziness, syncope.  Blood work concerning for elevation in creatinine although no recent baseline.  Patient does endorse history of CKD.  On neuro exam no focal neurologic deficit except did note slight vertical nystagmus.  Attempted to obtain MRI however MRI team had left for the evening.  Initially had ordered CTA but unable to obtain due to his kidney function.  Will check CT head for now.  Discussed case with neurology, [facility] recommends getting MRI to rule out acute stroke.  Reassessed patient, still having some nausea and dizziness.  Given the syncope, dizziness, vertical nystagmus, will consult hospitalist to admit patient for further syncope/stroke work-up.  Final Clinical Impression(s) / ED Diagnoses Final diagnoses:  Vertigo  Syncope, unspecified syncope type    Rx / DC Orders ED Discharge Orders     None        Lucrezia Starch, MD 01/04/21 2333

## 2021-01-04 NOTE — ED Triage Notes (Signed)
Patient states that he got dizzy and passed out about an hour and half ago. Patient states that his left side hit some boxes.

## 2021-01-05 ENCOUNTER — Observation Stay (HOSPITAL_COMMUNITY): Payer: Non-veteran care

## 2021-01-05 ENCOUNTER — Observation Stay (HOSPITAL_BASED_OUTPATIENT_CLINIC_OR_DEPARTMENT_OTHER): Payer: Non-veteran care

## 2021-01-05 DIAGNOSIS — E039 Hypothyroidism, unspecified: Secondary | ICD-10-CM

## 2021-01-05 DIAGNOSIS — R4020 Unspecified coma: Secondary | ICD-10-CM

## 2021-01-05 DIAGNOSIS — N183 Chronic kidney disease, stage 3 unspecified: Secondary | ICD-10-CM

## 2021-01-05 DIAGNOSIS — I81 Portal vein thrombosis: Secondary | ICD-10-CM

## 2021-01-05 DIAGNOSIS — R55 Syncope and collapse: Secondary | ICD-10-CM

## 2021-01-05 DIAGNOSIS — I10 Essential (primary) hypertension: Secondary | ICD-10-CM

## 2021-01-05 LAB — TSH: TSH: 4.005 u[IU]/mL (ref 0.350–4.500)

## 2021-01-05 LAB — ECHOCARDIOGRAM COMPLETE
Area-P 1/2: 2.68 cm2
Height: 70 in
S' Lateral: 2.3 cm
Weight: 2536.17 oz

## 2021-01-05 LAB — GLUCOSE, CAPILLARY: Glucose-Capillary: 88 mg/dL (ref 70–99)

## 2021-01-05 LAB — BASIC METABOLIC PANEL
Anion gap: 7 (ref 5–15)
BUN: 39 mg/dL — ABNORMAL HIGH (ref 8–23)
CO2: 24 mmol/L (ref 22–32)
Calcium: 9.1 mg/dL (ref 8.9–10.3)
Chloride: 108 mmol/L (ref 98–111)
Creatinine, Ser: 2.74 mg/dL — ABNORMAL HIGH (ref 0.61–1.24)
GFR, Estimated: 24 mL/min — ABNORMAL LOW (ref >60)
Glucose, Bld: 98 mg/dL (ref 70–99)
Potassium: 4.7 mmol/L (ref 3.5–5.1)
Sodium: 139 mmol/L (ref 135–145)

## 2021-01-05 MED ORDER — SODIUM CHLORIDE 0.9% FLUSH
3.0000 mL | Freq: Two times a day (BID) | INTRAVENOUS | Status: DC
  Administered 2021-01-05 (×2): 3 mL via INTRAVENOUS

## 2021-01-05 MED ORDER — MECLIZINE HCL 25 MG PO TABS
12.5000 mg | ORAL_TABLET | Freq: Two times a day (BID) | ORAL | Status: DC
  Administered 2021-01-05: 12.5 mg via ORAL
  Filled 2021-01-05: qty 1

## 2021-01-05 MED ORDER — SODIUM CHLORIDE 0.9 % IV BOLUS
500.0000 mL | Freq: Once | INTRAVENOUS | Status: AC
  Administered 2021-01-05: 500 mL via INTRAVENOUS

## 2021-01-05 MED ORDER — ENOXAPARIN SODIUM 30 MG/0.3ML IJ SOSY
30.0000 mg | PREFILLED_SYRINGE | INTRAMUSCULAR | Status: DC
  Administered 2021-01-05: 30 mg via SUBCUTANEOUS
  Filled 2021-01-05: qty 0.3

## 2021-01-05 NOTE — Progress Notes (Signed)
Echocardiogram 2D Echocardiogram has been performed.  Oneal Deputy Dominica Kent RDCS 01/05/2021, 9:13 AM

## 2021-01-05 NOTE — Discharge Summary (Signed)
Physician Discharge Summary  [name] [id number] DOB: 05/08/1951 DOA: 01/04/2021  PCP: [facility]  Admit date: 01/04/2021 Discharge date: 01/05/2021  Admitted From: Home Disposition:  Home  Recommendations for Outpatient Follow-up:  Follow up with PCP in 1 week.  Repeat BMP and if creatinine returns back to baseline, can resume lisinopril and HCTZ.  Discharge Condition: Stable CODE STATUS: Full  Diet recommendation: Regular   Brief/Interim Summary: From H&P by Dr. Flossie Buffy: "[name] is a 70 y.o. male with medical history significant for TIA, chronic portal vein thrombus previously on Coumadin, chronic pancreatitis, GERD, history of GI bleed, hypertension, hypothyroidism s/p thyroidectomy, CKD stage III who presents with concerns of syncope and loss of consciousness.   Patient reports that he was packing and moving boxes today inside his home.  When he bent down and stood back up he suddenly felt lightheaded and dizzy.  He then passed out and fell hitting his head on a box and hit his right side on a tote.  Thinks he loss consciousness for a few seconds.  He woke up and felt lower extremity weakness and numbness and tingling down his left arm.  Also felt posterior headache which he thinks could be related to previous cervical surgery.  He was able to get up and crawl into a chair.  He rested for 5 minutes and then stood up again but felt dizzy again and felt like everything in his vision turned red.  He then sat back down and called the [place] where he gets all of his care and was told to go to ED.  He then called a friend who drove him here.   He states for the past few days he has been feeling dizzy but only usually when he moves around or tries to do things around the house.  He has not felt any chest pain, shortness of breath or palpitation.  He has felt nauseous with his dizzy episodes but no vomiting.  States he has been staying hydrated.   ED Course: He was afebrile mildly  hypertensive up to systolic of 0000000 at times.  No leukocytosis.  Hemoglobin of 11.3.  Creatinine elevated at 3.24 with remote creatinine of 2.2 back in 2017.  EKG showed normal sinus rhythm.  Chest x-ray negative.  Hip and pelvis x-ray was negative.   Pt noted to have vertical nystagmus on exam. ED physician discussed with neurology Dr. Rory Percy who recommended MRI brain for work-up of stroke but unfortunately MRI tech has left for the night.  CT of the head is pending at the time of admission.   Hospitalist then called for continuous work-up of syncope."  MRI brain was negative for stroke, echocardiogram without significant structural abnormalities, no significant aortic stenosis.  Orthostatic vital sign was negative.  Creatinine improved with IV fluid.  TSH normal.  Discharge Diagnoses:  Principal Problem:   Syncope Active Problems:   CKD (chronic kidney disease), stage III (HCC)   Hypertension   Loss of consciousness (HCC)   Hypothyroidism   Portal vein thrombosis   Discharge Instructions  Discharge Instructions     Call MD for:  difficulty breathing, headache or visual disturbances   Complete by: As directed    Call MD for:  extreme fatigue   Complete by: As directed    Call MD for:  persistant dizziness or light-headedness   Complete by: As directed    Call MD for:  persistant nausea and vomiting   Complete by: As  directed    Call MD for:  severe uncontrolled pain   Complete by: As directed    Call MD for:  temperature >100.4   Complete by: As directed    Diet - low sodium heart healthy   Complete by: As directed    Discharge instructions   Complete by: As directed    You were cared for by a hospitalist during your hospital stay. If you have any questions about your discharge medications or the care you received while you were in the hospital after you are discharged, you can call the unit and ask to speak with the hospitalist on call if the hospitalist that took care of you  is not available. Once you are discharged, your primary care physician will handle any further medical issues. Please note that NO REFILLS for any discharge medications will be authorized once you are discharged, as it is imperative that you return to your primary care physician (or establish a relationship with a primary care physician if you do not have one) for your aftercare needs so that they can reassess your need for medications and monitor your lab values.   Increase activity slowly   Complete by: As directed       Allergies as of 01/05/2021       Reactions   Aspirin    Stomach upset   Aspirin-acetaminophen-caffeine Nausea Only   Other reaction(s): Hyperactive behavior, Nausea   Chlorthalidone Diarrhea   Other reaction(s): Diarrhea, Low blood pressure        Medication List     STOP taking these medications    hydrochlorothiazide 25 MG tablet Commonly known as: HYDRODIURIL   lisinopril 40 MG tablet Commonly known as: ZESTRIL   warfarin 5 MG tablet Commonly known as: COUMADIN       TAKE these medications    acetaminophen 500 MG tablet Commonly known as: TYLENOL Take 1,000 mg by mouth daily as needed.   atenolol 25 MG tablet Commonly known as: TENORMIN Take 25 mg by mouth daily.   benzoyl peroxide 10 % gel Apply 1 application topically daily.   carboxymethylcellulose 1 % ophthalmic solution Apply 1 drop to eye 4 (four) times daily as needed (for eye irritation).   clindamycin 75 MG/5ML solution Commonly known as: CLEOCIN Take by mouth 2 (two) times daily. Apply to shoulders, chest, stomach, and back   coal tar-salicylic acid 2 % shampoo Apply 1 application topically 3 (three) times a week. Reported on 08/19/2015   finasteride 5 MG tablet Commonly known as: PROSCAR Take 5 mg by mouth daily.   levothyroxine 50 MCG tablet Commonly known as: SYNTHROID Take 50 mcg by mouth daily.   lipase/protease/amylase 12000-38000 units Cpep capsule Commonly known  as: CREON Take 2 capsules by mouth 3 (three) times daily before meals.   meclizine 25 MG tablet Commonly known as: ANTIVERT Take 1 tablet (25 mg total) by mouth 3 (three) times daily as needed for dizziness.   omeprazole 20 MG capsule Commonly known as: PRILOSEC Take 20 mg by mouth daily.   ondansetron 8 MG tablet Commonly known as: ZOFRAN Take by mouth every 8 (eight) hours as needed. Reported on 08/19/2015   simvastatin 80 MG tablet Commonly known as: ZOCOR Take 80 mg by mouth daily.   sodium chloride 0.65 % Soln nasal spray Commonly known as: OCEAN Place 1 spray into both nostrils as needed for congestion.   triamcinolone cream 0.1 % Commonly known as: KENALOG Apply 1 application topically 2 (two)  times daily. Reported on 08/19/2015        Follow-up [facility]. Schedule an appointment as soon as possible for a visit in 1 week(s).   Specialty: General Practice Why: Repeat lab work and if kidney function returns back to baseline, can resume lisinopril and hydrochlorothiazide. Contact information: [place] [id number] [phone]                Allergies  Allergen Reactions   Aspirin     Stomach upset   Aspirin-Acetaminophen-Caffeine Nausea Only    Other reaction(s): Hyperactive behavior, Nausea   Chlorthalidone Diarrhea    Other reaction(s): Diarrhea, Low blood pressure    Consultations: None    Procedures/Studies: DG Chest 2 View  Result Date: 01/04/2021 CLINICAL DATA:  Syncopal episode today. Hypertension. EXAM: CHEST - 2 VIEW COMPARISON:  08/19/2015 FINDINGS: Heart size is normal. Both lungs are clear. Moderate levoscoliosis is again seen involving the upper thoracic spine. Surgical clips again seen at the level of the thoracic inlet. IMPRESSION: No active cardiopulmonary disease. Electronically Signed   By: Marlaine Hind M.D.   On: 01/04/2021 21:25   CT Head Wo Contrast  Result Date:  01/05/2021 CLINICAL DATA:  Vertigo, central. Patient states that he got dizzy and passed out about an hour and half ago. Patient states that his left side hit some boxes EXAM: CT HEAD WITHOUT CONTRAST TECHNIQUE: Contiguous axial images were obtained from the base of the skull through the vertex without intravenous contrast. COMPARISON:  CT head 02/22/2014 FINDINGS: Brain: Cerebral ventricle sizes are concordant with the degree of cerebral volume loss. Patchy and confluent areas of decreased attenuation are noted throughout the deep and periventricular white matter of the cerebral hemispheres bilaterally, compatible with chronic microvascular ischemic disease. No evidence of large-territorial acute infarction. No parenchymal hemorrhage. No mass lesion. No extra-axial collection. No mass effect or midline shift. No hydrocephalus. Basilar cisterns are patent. Vascular: No hyperdense vessel. Skull: No acute fracture or focal lesion. Sinuses/Orbits: Mucosal thickening of the right maxillary sinus. Otherwise paranasal sinuses and mastoid air cells are clear. The orbits are unremarkable. Other: None. IMPRESSION: No acute intracranial abnormality. Electronically Signed   By: Iven Finn M.D.   On: 01/05/2021 00:54   MR BRAIN WO CONTRAST  Result Date: 01/05/2021 CLINICAL DATA:  70 year old male with dizziness, vertigo, syncope and fall. EXAM: MRI HEAD WITHOUT CONTRAST TECHNIQUE: Multiplanar, multiecho pulse sequences of the brain and surrounding structures were obtained without intravenous contrast. COMPARISON:  Head CT earlier today.  Brain MRI 12/28/2003. FINDINGS: Brain: No restricted diffusion to suggest acute infarction. No midline shift, mass effect, evidence of mass lesion, ventriculomegaly, extra-axial collection or acute intracranial hemorrhage. Cervicomedullary junction and pituitary are within normal limits. Cerebral volume is within normal limits for age. Widely scattered generally small and patchy  bilateral white matter T2 and FLAIR hyperintensity has progressed since 2005, moderate for age. Mild similar signal heterogeneity in the central pons. Moderate similar heterogeneity in the left lentiform nuclei is new since 2005. But no cortical encephalomalacia identified. No chronic cerebral blood products identified. The other deep gray matter nuclei and the cerebellum are within normal limits. Vascular: Major intracranial vascular flow voids are stable since 2005, dominant left vertebral artery. Chronic intracranial artery tortuosity. Skull and upper cervical spine: Partially visible cervical spine degeneration. Visualized bone marrow signal is within normal limits. Sinuses/Orbits: Chronic left lamina papyracea fracture. Otherwise negative orbits. Chronic small maxillary sinus mucous retention cysts are  stable since 2005. Other: Visible internal auditory structures appear normal. Mastoids are clear. Visible scalp and face appear negative. IMPRESSION: 1. No acute intracranial abnormality. 2. Moderate for age signal changes in the cerebral white matter and left basal ganglia likely due to chronic small vessel disease. Electronically Signed   By: Genevie Ann M.D.   On: 01/05/2021 08:27   ECHOCARDIOGRAM COMPLETE  Result Date: 01/05/2021    ECHOCARDIOGRAM REPORT   Patient Name:   [name] Date of Exam: 01/05/2021 Medical Rec #:  [id number]       Height:       70.0 in Accession #:    [id number]      Weight:       158.5 lb Date of Birth:  [birth year]       BSA:          1.891 m Patient Age:    70 years        BP:           148/92 mmHg Patient Gender: M               HR:           85 bpm. Exam Location:  Inpatient Procedure: 2D Echo, Color Doppler and Cardiac Doppler Indications:    R55 Syncope  History:        Patient has no prior history of Echocardiogram examinations.                 Risk Factors:Hypertension and Dyslipidemia.  Sonographer:    Raquel Sarna Senior RDCS Referring Phys: [id number] [place]  1.  Left ventricular ejection fraction, by estimation, is 60 to 65%. The left ventricle has normal function. The left ventricle has no regional wall motion abnormalities. Left ventricular diastolic parameters are consistent with Grade I diastolic dysfunction (impaired relaxation). Elevated left ventricular end-diastolic pressure.  2. Right ventricular systolic function is normal. The right ventricular size is normal.  3. The mitral valve is normal in structure. Trivial mitral valve regurgitation. No evidence of mitral stenosis.  4. The aortic valve is tricuspid. Aortic valve regurgitation is trivial. No aortic stenosis is present.  5. The inferior vena cava is normal in size with greater than 50% respiratory variability, suggesting right atrial pressure of 3 mmHg. FINDINGS  Left Ventricle: Left ventricular ejection fraction, by estimation, is 60 to 65%. The left ventricle has normal function. The left ventricle has no regional wall motion abnormalities. The left ventricular internal cavity size was normal in size. There is  no left ventricular hypertrophy. Left ventricular diastolic parameters are consistent with Grade I diastolic dysfunction (impaired relaxation). Elevated left ventricular end-diastolic pressure. Right Ventricle: The right ventricular size is normal. No increase in right ventricular wall thickness. Right ventricular systolic function is normal. Left Atrium: Left atrial size was normal in size. Right Atrium: Right atrial size was normal in size. Pericardium: There is no evidence of pericardial effusion. Mitral Valve: The mitral valve is normal in structure. Trivial mitral valve regurgitation. No evidence of mitral valve stenosis. Tricuspid Valve: The tricuspid valve is normal in structure. Tricuspid valve regurgitation is not demonstrated. No evidence of tricuspid stenosis. Aortic Valve: The aortic valve is tricuspid. Aortic valve regurgitation is trivial. No aortic stenosis is present. Pulmonic Valve:  The pulmonic valve was normal in structure. Pulmonic valve regurgitation is not visualized. No evidence of pulmonic stenosis. Aorta: The aortic root is normal in size and structure. Venous: The inferior vena cava is normal  in size with greater than 50% respiratory variability, suggesting right atrial pressure of 3 mmHg. IAS/Shunts: No atrial level shunt detected by color flow Doppler.  LEFT VENTRICLE PLAX 2D LVIDd:         3.40 cm  Diastology LVIDs:         2.30 cm  LV e' medial:    5.66 cm/s LV PW:         0.80 cm  LV E/e' medial:  14.1 LV IVS:        0.90 cm  LV e' lateral:   9.14 cm/s LVOT diam:     2.00 cm  LV E/e' lateral: 8.7 LV SV:         53 LV SV Index:   28 LVOT Area:     3.14 cm  RIGHT VENTRICLE RV S prime:     10.40 cm/s TAPSE (M-mode): 1.9 cm LEFT ATRIUM             Index       RIGHT ATRIUM           Index LA diam:        3.10 cm 1.64 cm/m  RA Area:     12.00 cm LA Vol (A2C):   31.5 ml 16.66 ml/m RA Volume:   27.30 ml  14.44 ml/m LA Vol (A4C):   31.3 ml 16.55 ml/m LA Biplane Vol: 33.0 ml 17.45 ml/m  AORTIC VALVE LVOT Vmax:   84.90 cm/s LVOT Vmean:  63.700 cm/s LVOT VTI:    0.168 m  AORTA Ao Root diam: 3.30 cm MITRAL VALVE MV Area (PHT): 2.68 cm     SHUNTS MV Decel Time: 283 msec     Systemic VTI:  0.17 m MV E velocity: 79.70 cm/s   Systemic Diam: 2.00 cm MV A velocity: 102.00 cm/s MV E/A ratio:  0.78 Skeet Latch MD Electronically signed by Skeet Latch MD Signature Date/Time: 01/05/2021/11:54:55 AM    Final    DG Hip Unilat W or Wo Pelvis 2-3 Views Right  Result Date: 01/04/2021 CLINICAL DATA:  Fall. Right hip pain. EXAM: DG HIP (WITH OR WITHOUT PELVIS) 2-3V RIGHT COMPARISON:  None. FINDINGS: There is no evidence of hip fracture or dislocation. There is no evidence of arthropathy. Enthesopathic changes are seen arising from the right ilium and lesser trochanter. IMPRESSION: No acute findings. Electronically Signed   By: Marlaine Hind M.D.   On: 01/04/2021 21:26       Discharge  Exam: Vitals:   01/05/21 0520 01/05/21 0944  BP: (!) 148/92 (!) 154/91  Pulse: 84 80  Resp: 16 16  Temp: 98.2 F (36.8 C) 98 F (36.7 C)  SpO2: 100% 99%    General: Pt is alert, awake, not in acute distress Cardiovascular: RRR, S1/S2 +, no edema Respiratory: CTA bilaterally, no wheezing, no rhonchi, no respiratory distress, no conversational dyspnea  Abdominal: Soft, NT, ND, bowel sounds + Extremities: no edema, no cyanosis Psych: Normal mood and affect, stable judgement and insight     The results of significant diagnostics from this hospitalization (including imaging, microbiology, ancillary and laboratory) are listed below for reference.     Microbiology: No results found for this or any previous visit (from the past 240 hour(s)).   Labs: BNP (last 3 results) No results for input(s): BNP in the last 8760 hours. Basic Metabolic Panel: Recent Labs  Lab 01/04/21 2044 01/05/21 0613  NA 141 139  K 4.4 4.7  CL 108 108  CO2 27  24  GLUCOSE 101* 98  BUN 45* 39*  CREATININE 3.24* 2.74*  CALCIUM 9.4 9.1  MG 2.2  --    Liver Function Tests: No results for input(s): AST, ALT, ALKPHOS, BILITOT, PROT, ALBUMIN in the last 168 hours. No results for input(s): LIPASE, AMYLASE in the last 168 hours. No results for input(s): AMMONIA in the last 168 hours. CBC: Recent Labs  Lab 01/04/21 2044  WBC 6.8  NEUTROABS 3.6  HGB 11.3*  HCT 36.1*  MCV 78.0*  PLT 221   Cardiac Enzymes: No results for input(s): CKTOTAL, CKMB, CKMBINDEX, TROPONINI in the last 168 hours. BNP: Invalid input(s): POCBNP CBG: Recent Labs  Lab 01/05/21 0523  GLUCAP 88   D-Dimer No results for input(s): DDIMER in the last 72 hours. Hgb A1c No results for input(s): HGBA1C in the last 72 hours. Lipid Profile No results for input(s): CHOL, HDL, LDLCALC, TRIG, CHOLHDL, LDLDIRECT in the last 72 hours. Thyroid function studies Recent Labs    01/05/21 0613  TSH 4.005   Anemia work up No results  for input(s): VITAMINB12, FOLATE, FERRITIN, TIBC, IRON, RETICCTPCT in the last 72 hours. Urinalysis    Component Value Date/Time   COLORURINE YELLOW 01/28/2012 2312   APPEARANCEUR CLEAR 01/28/2012 2312   LABSPEC 1.020 01/28/2012 2312   PHURINE 6.0 01/28/2012 2312   GLUCOSEU NEGATIVE 01/28/2012 2312   HGBUR NEGATIVE 01/28/2012 2312   BILIRUBINUR NEGATIVE 01/28/2012 2312   KETONESUR NEGATIVE 01/28/2012 2312   PROTEINUR NEGATIVE 01/28/2012 2312   UROBILINOGEN 0.2 01/28/2012 2312   NITRITE NEGATIVE 01/28/2012 2312   LEUKOCYTESUR NEGATIVE 01/28/2012 2312   Sepsis Labs Invalid input(s): PROCALCITONIN,  WBC,  LACTICIDVEN Microbiology No results found for this or any previous visit (from the past 240 hour(s)).   Patient was seen and examined on the day of discharge and was found to be in stable condition. Time coordinating discharge: 35 minutes including assessment and coordination of care, as well as examination of the patient.   SIGNED:  Dessa Phi, DO [facility] 01/05/2021, 1:13 PM

## 2021-01-05 NOTE — H&P (Addendum)
History and Physical    [name] [id number] DOB: August 22, 1950 DOA: 01/04/2021  PCP: [facility]  Patient coming from: Home  I have personally briefly reviewed patient's old medical records in [place]  Chief Complaint: Syncope and loss of consciousness  HPI: [name] is a 70 y.o. male with medical history significant for TIA, chronic portal vein thrombus previously on Coumadin, chronic pancreatitis, GERD, history of GI bleed, hypertension, hypothyroidism s/p thyroidectomy, CKD stage III who presents with concerns of syncope and loss of consciousness.  Patient reports that he was packing and moving boxes today inside his home.  When he bent down and stood back up he suddenly felt lightheaded and dizzy.  He then passed out and fell hitting his head on a box and hit his right side on a tote.  Thinks he loss consciousness for a few seconds.  He woke up and felt lower extremity weakness and numbness and tingling down his left arm.  Also felt posterior headache which he thinks could be related to previous cervical surgery.  He was able to get up and crawl into a chair.  He rested for 5 minutes and then stood up again but felt dizzy again and felt like everything in his vision turned red.  He then sat back down and called the [place] where he gets all of his care and was told to go to ED.  He then called a friend who drove him here.  He states for the past few days he has been feeling dizzy but only usually when he moves around or tries to do things around the house.  He has not felt any chest pain, shortness of breath or palpitation.  He has felt nauseous with his dizzy episodes but no vomiting.  States he has been staying hydrated.  ED Course: He was afebrile mildly hypertensive up to systolic of 0000000 at times.  No leukocytosis.  Hemoglobin of 11.3.  Creatinine elevated at 3.24 with remote creatinine of 2.2 back in 2017.  EKG showed normal sinus rhythm.  Chest x-ray negative.   Hip and pelvis x-ray was negative.  Pt noted to have vertical nystagmus on exam. ED physician discussed with neurology Dr. Rory Percy who recommended MRI brain for work-up of stroke but unfortunately MRI tech has left for the night.  CT of the head is pending at the time of admission.  Hospitalist then called for continuous work-up of syncope.  Review of Systems: Constitutional: No Weight Change, No Fever ENT/Mouth: No sore throat, No Rhinorrhea Eyes: No Eye Pain, No Vision Changes Cardiovascular: No Chest Pain, no SOB,  No Edema, No Palpitations Respiratory: No Cough, No Sputum,   Gastrointestinal: No Nausea, No Vomiting, No Diarrhea,  No Pain Genitourinary: no Urinary Incontinence Musculoskeletal: No Arthralgias, No Myalgias Skin: No Skin Lesions, No Pruritus, Neuro: +Weakness, No Numbness,  + Loss of Consciousness, + Syncope Psych: No Anxiety/Panic, No Depression, no decrease appetite Heme/Lymph: No Bruising, No Bleeding  Past Medical History:  Diagnosis Date   Colon cancer ([name])    Colon polyps    Hyperlipidemia    Hypertension    Pancreatitis    Stroke [facility])     Past Surgical History:  Procedure Laterality Date   CHOLECYSTECTOMY     NECK SURGERY     SHOULDER ARTHROSCOPY Right December 07 2013   THYROID SURGERY       reports that he quit smoking about 38 years ago. His smoking use included cigarettes. He  has never used smokeless tobacco. He reports that he does not drink alcohol and does not use drugs. Social History  Allergies  Allergen Reactions   Aspirin     Stomach upset   Aspirin-Acetaminophen-Caffeine Nausea Only    Other reaction(s): Hyperactive behavior, Nausea   Chlorthalidone Diarrhea    Other reaction(s): Diarrhea, Low blood pressure    Family History  Problem Relation Age of Onset   Colon cancer Mother    Colon cancer Sister    Colon cancer Brother      Prior to Admission medications   Medication Sig Start Date End Date Taking? Authorizing Provider   acetaminophen (TYLENOL) 500 MG tablet Take 1,000 mg by mouth daily as needed.    [provider]  atenolol (TENORMIN) 25 MG tablet Take 25 mg by mouth daily.    [provider]  benzoyl peroxide 10 % gel Apply 1 application topically daily.    [provider]  carboxymethylcellulose 1 % ophthalmic solution Apply 1 drop to eye 4 (four) times daily as needed (for eye irritation).    [provider]  clindamycin (CLEOCIN) 75 MG/5ML solution Take by mouth 2 (two) times daily. Apply to shoulders, chest, stomach, and back    [provider]  coal tar-salicylic acid 2 % shampoo Apply 1 application topically 3 (three) times a week. Reported on 08/19/2015    [provider]  finasteride (PROSCAR) 5 MG tablet Take 5 mg by mouth daily.    [provider]  hydrochlorothiazide (HYDRODIURIL) 25 MG tablet Take 25 mg by mouth daily.    [provider]  levothyroxine (SYNTHROID, LEVOTHROID) 50 MCG tablet Take 50 mcg by mouth daily.    [provider]  lipase/protease/amylase (CREON-12/PANCREASE) 12000 UNITS CPEP capsule Take 2 capsules by mouth 3 (three) times daily before meals.    [provider]  lisinopril (PRINIVIL,ZESTRIL) 40 MG tablet Take 20 mg by mouth daily.    [provider]  meclizine (ANTIVERT) 25 MG tablet Take 1 tablet (25 mg total) by mouth 3 (three) times daily as needed for dizziness. 08/19/15   Noemi Chapel, MD  omeprazole (PRILOSEC) 20 MG capsule Take 20 mg by mouth daily.    [provider]  ondansetron (ZOFRAN) 8 MG tablet Take by mouth every 8 (eight) hours as needed. Reported on 08/19/2015    [provider]  simvastatin (ZOCOR) 80 MG tablet Take 80 mg by mouth daily.     [provider]  sodium chloride (OCEAN) 0.65 % SOLN nasal spray Place 1 spray into both nostrils as needed for congestion.    [provider]  triamcinolone cream (KENALOG) 0.1 % Apply 1  application topically 2 (two) times daily. Reported on 08/19/2015    [provider]  warfarin (COUMADIN) 5 MG tablet Take 5-7.5 mg by mouth every evening. 1 tablet every day except Mondays and Fridays. Takes 1.5 tablets on Monday and Friday.    [provider]    Physical Exam: Vitals:   01/04/21 2002 01/04/21 2145 01/04/21 2230 01/04/21 2300  BP: 140/82 (!) 155/89 (!) 157/98 (!) 150/91  Pulse: 87 88 94 85  Resp: '14 19 18 18  '$ Temp: 98.7 F (37.1 C) 98.2 F (36.8 C)    TempSrc: Oral Oral    SpO2: 96% 99% 99% 100%  Weight: 74.8 kg     Height: '5\' 10"'$  (1.778 m)       Constitutional: NAD, calm, comfortable, elderly male laying flat in bed  Vitals:   01/04/21 2002 01/04/21 2145 01/04/21 2230 01/04/21 2300  BP: 140/82 (!) 155/89 (!) 157/98 (!) 150/91  Pulse: 87 88 94 85  Resp: '14 19 18 18  '$ Temp: 98.7 F (37.1 C) 98.2 F (36.8 C)    TempSrc: Oral Oral    SpO2: 96% 99% 99% 100%  Weight: 74.8 kg     Height: '5\' 10"'$  (1.778 m)      Eyes: PERRL, lids and conjunctivae normal ENMT: Mucous membranes are moist.  Neck: normal, supple Respiratory: clear to auscultation bilaterally, no wheezing, no crackles. Normal respiratory effort. No accessory muscle use.  Cardiovascular: Regular rate and rhythm, no murmurs / rubs / gallops. No extremity edema. 2+ pedal pulses.  Abdomen: no tenderness, no masses palpated.  Bowel sounds positive.  Musculoskeletal: no clubbing / cyanosis. No joint deformity upper and lower extremities. Good ROM, no contractures. Normal muscle tone.  Skin: no rashes, lesions, ulcers. No induration Neurologic: CN 2-12 grossly intact. Vertical nystagmus with figure H exam, Sensation intact, Strength 5/5 except for right UE to due pain in cervical region. Dizziness symptoms are reproducible when asked to sit and lay back down.  Unable to do Dix-Hallpike maneuver due to cervical spine surgery in the past. Psychiatric: Normal judgment and insight. Alert and oriented  x 3. Normal mood.    Labs on Admission: I have personally reviewed following labs and imaging studies  CBC: Recent Labs  Lab 01/04/21 2044  WBC 6.8  NEUTROABS 3.6  HGB 11.3*  HCT 36.1*  MCV 78.0*  PLT [id number]   Basic Metabolic Panel: Recent Labs  Lab 01/04/21 2044  NA 141  K 4.4  CL 108  CO2 27  GLUCOSE 101*  BUN 45*  CREATININE 3.24*  CALCIUM 9.4  MG 2.2   GFR: Estimated Creatinine Clearance: 21.9 mL/min (A) (by C-G formula based on SCr of 3.24 mg/dL (H)). Liver Function Tests: No results for input(s): AST, ALT, ALKPHOS, BILITOT, PROT, ALBUMIN in the last 168 hours. No results for input(s): LIPASE, AMYLASE in the last 168 hours. No results for input(s): AMMONIA in the last 168 hours. Coagulation Profile: Recent Labs  Lab 01/04/21 2044  INR 1.0   Cardiac Enzymes: No results for input(s): CKTOTAL, CKMB, CKMBINDEX, TROPONINI in the last 168 hours. BNP (last 3 results) No results for input(s): PROBNP in the last 8760 hours. HbA1C: No results for input(s): HGBA1C in the last 72 hours. CBG: No results for input(s): GLUCAP in the last 168 hours. Lipid Profile: No results for input(s): CHOL, HDL, LDLCALC, TRIG, CHOLHDL, LDLDIRECT in the last 72 hours. Thyroid Function Tests: No results for input(s): TSH, T4TOTAL, FREET4, T3FREE, THYROIDAB in the last 72 hours. Anemia Panel: No results for input(s): VITAMINB12, FOLATE, FERRITIN, TIBC, IRON, RETICCTPCT in the last 72 hours. Urine analysis:    Component Value Date/Time   COLORURINE YELLOW 01/28/2012 2312   APPEARANCEUR CLEAR 01/28/2012 2312   LABSPEC 1.020 01/28/2012 2312   PHURINE 6.0 01/28/2012 2312   GLUCOSEU NEGATIVE 01/28/2012 2312   HGBUR NEGATIVE 01/28/2012 2312   BILIRUBINUR NEGATIVE 01/28/2012 2312   KETONESUR NEGATIVE 01/28/2012 2312   PROTEINUR NEGATIVE 01/28/2012 2312   UROBILINOGEN 0.2 01/28/2012 2312   NITRITE NEGATIVE 01/28/2012 2312   LEUKOCYTESUR NEGATIVE 01/28/2012 2312    Radiological  Exams on Admission: DG Chest 2 View  Result Date: 01/04/2021 CLINICAL DATA:  Syncopal episode today. Hypertension. EXAM: CHEST - 2 VIEW COMPARISON:  08/19/2015 FINDINGS: Heart size is normal. Both lungs are clear. Moderate levoscoliosis  is again seen involving the upper thoracic spine. Surgical clips again seen at the level of the thoracic inlet. IMPRESSION: No active cardiopulmonary disease. Electronically Signed   By: Marlaine Hind M.D.   On: 01/04/2021 21:25   DG Hip Unilat W or Wo Pelvis 2-3 Views Right  Result Date: 01/04/2021 CLINICAL DATA:  Fall. Right hip pain. EXAM: DG HIP (WITH OR WITHOUT PELVIS) 2-3V RIGHT COMPARISON:  None. FINDINGS: There is no evidence of hip fracture or dislocation. There is no evidence of arthropathy. Enthesopathic changes are seen arising from the right ilium and lesser trochanter. IMPRESSION: No acute findings. Electronically Signed   By: Marlaine Hind M.D.   On: 01/04/2021 21:26      Assessment/Plan  Syncope with loss of consciousness -Patient had vertical nystagmus on exam and dizziness symptoms were reproducible with positional changes.  Most likely vertigo/BPPV but given history of TIA we will continue stroke work-up at the recommendation of neurology -CT head pending - MRI brain in the morning as technician has left for the night -obtain echocardiogram -obtain TSH given hx of thyroidectomy -obtain orthostatic vital signs  -Keep on continuous telemetry -Trial of meclizine 12.'5mg'$  BID  HTN -allow for permissive hypertension for now while undergoing stroke work-up  CKD stage III  -Creatinine of 3.24 on admission but no recent prior for comparison.  Unclear if this is AKI versus worsening of his renal function  Hypothyroidism s/p history of thyroidectomy -obtain TSH  Chronic portal vein thrombus -Previously on Coumadin but states that he was taken off about 3 to 4 months ago by a physician at [place] unable to verify meds since pt does not  know what he takes and [facility]  DVT prophylaxis:.Lovenox Code Status: Full Family Communication: Plan discussed with patient at bedside  disposition Plan: Home with observation Consults called:  Admission status: Observation  Level of care: Telemetry  Status is: Observation  The patient remains OBS appropriate and will d/c before 2 midnights.  Dispo: The patient is from: Home              Anticipated d/c is to: Home              Patient currently is not medically stable to d/c.   Difficult to place patient No         Orene Desanctis DO [facility]   If 7PM-7AM, please contact night-coverage [email or web address]   01/05/2021, 12:07 AM

## 2021-01-05 NOTE — ED Notes (Signed)
Patient transported to CT 

## 2023-07-14 ENCOUNTER — Ambulatory Visit: Payer: Medicaid Other | Admitting: Family Medicine

## 2023-07-14 ENCOUNTER — Encounter: Payer: Self-pay | Admitting: Family Medicine

## 2023-07-14 VITALS — BP 160/90 | HR 72 | Ht 70.0 in | Wt 168.4 lb

## 2023-07-14 DIAGNOSIS — I10 Essential (primary) hypertension: Secondary | ICD-10-CM

## 2023-07-14 DIAGNOSIS — Z0000 Encounter for general adult medical examination without abnormal findings: Secondary | ICD-10-CM

## 2023-07-14 DIAGNOSIS — Z23 Encounter for immunization: Secondary | ICD-10-CM

## 2023-07-14 DIAGNOSIS — E039 Hypothyroidism, unspecified: Secondary | ICD-10-CM

## 2023-07-14 DIAGNOSIS — Z125 Encounter for screening for malignant neoplasm of prostate: Secondary | ICD-10-CM

## 2023-07-14 DIAGNOSIS — K635 Polyp of colon: Secondary | ICD-10-CM

## 2023-07-14 DIAGNOSIS — E785 Hyperlipidemia, unspecified: Secondary | ICD-10-CM

## 2023-07-14 DIAGNOSIS — N183 Chronic kidney disease, stage 3 unspecified: Secondary | ICD-10-CM

## 2023-07-14 MED ORDER — LISINOPRIL 20 MG PO TABS: 20.0000 mg | ORAL_TABLET | Freq: Every day | ORAL | 3 refills | Status: DC

## 2023-07-14 NOTE — Progress Notes (Signed)
 SUBJECTIVE:   CHIEF COMPLAINT / HPI:   Presents to establish care  Epic History has been updated  PMHx: Past Medical History:  Diagnosis Date   Chronic kidney disease, stage 3 (HCC)    Chronic pancreatitis (HCC)    Colon polyps    GERD (gastroesophageal reflux disease)    Hyperlipidemia    Hypertension    Hypothyroidism    Portal vein thrombosis    previously on Coumadin, no longer requiring anticoag   Prediabetes    Stroke (HCC)    2004, L sided weakness (no residual)   Vitamin D  deficiency    Presumed GI bleed in 2013, EGD and colonoscopy at that time with small hiatal hernia, few hyperplastic polyps. DENIES HISTORY OF COLON CANCER.  Surgical Hx: Past Surgical History:  Procedure Laterality Date   CHOLECYSTECTOMY     NECK SURGERY     bone spurs   SHOULDER ARTHROSCOPY Right 12/07/2013   THYROIDECTOMY     Family Hx: Family History  Problem Relation Age of Onset   Colon cancer Mother    Colon cancer Sister    Colon cancer Brother     Social Hx: Current Social History   Who lives at home: Alone, no issues with falls, manages his own medications 07/14/2023  Who would speak for you about health care matters: Sister listed as emergency contact, this was verified  07/14/2023  Transportation: no car, takes the bus or has niece drive him.  Niece is a patient here 07/14/2023 Work / Education: Veteran 07/14/2023  Has DNR paperwork filed with the [place], confirms he is DNR/DNI.  He will bring this paperwork to his next appointment.  He does not have a healthcare power of attorney, paperwork provided today.  States we can talk to his sister regarding medical issues, she is listed as emergency contact  Medications: Amlodipine 10 mg half tablet Aspirin 81 mg Atorvastatin  80 mg Calcium  carbonate 500 mg Carvedilol  6.25 mg twice daily - maybe Vitamin D  1000 units daily Levothyroxine  7.5 mcg daily Lisinopril  20 mg daily Pantoprazole  20 mg daily Magnesium  oxide 400 mg  daily  Inform patient that he will need to bring his medications to his next appointment to confirm that these are correct, however he believes that this is correct other than carvedilol  he is unsure of.  Preventative Screening Colonoscopy: year 2013 results hyperplastic polyp found, patient denies history colon cancer PSA: unknown, ordered Tetanus vaccine: Unknown, declines today Pneumonia vaccine: Unknown, declines today Shingles vaccine: Unknown, declines today Heart stress test: stress test around 5 years ago, reports it was normal  Smoking status reviewed Quit over 30 years ago No alcohol  No drugs  OBJECTIVE:   BP (!) 160/90   Pulse 72   Ht 5' 10 (1.778 m)   Wt 168 lb 6 oz (76.4 kg)   SpO2 97%   BMI 24.16 kg/m    General: NAD, pleasant, able to participate in exam HEENT: normocephalic, TM's visualized bilaterally, no scleral icterus or conjunctival pallor, no nasal discharge, moist mucous membranes, fair dentition Neck: supple, non-tender, without lymphadenopathy.  No clavicular lymphadenopathy.  Old surgical scar posterior neck Cardiac: RRR, S1 S2 present. normal heart sounds, no murmurs. Respiratory: CTAB, normal effort, No wheezes, rales or rhonchi Abdomen: Normoactive bowel sounds, non-tender, non-distended Extremities: no edema or cyanosis.  5 out of 5 strength bilateral upper and lower extremities. Skin: warm and dry, no rashes noted Neuro: alert, no obvious focal deficits.  Able to ambulate across the room  without difficulty. Psych: Normal affect and mood  ASSESSMENT/PLAN:   Encounter for medical examination to establish care Blood pressure elevated today, patient states he has been out of his lisinopril  for about a month.  I reordered this for him today.  No complaints or concerns today.  Reviewed his medical history.  -Referral for colonoscopy placed today -Referral to optometry placed today -Advised to call dentist to establish care -Provided with HCPOA  paperwork to fill out -Advised to bring the DNR paperwork to his next appointment along with all of his medications -Will come back next week for lab work    [name], DO [facility]

## 2023-07-14 NOTE — Patient Instructions (Signed)
 It was great to see you today! Thank you for choosing [facility] for your primary care. [name] was seen for establishing care.  Today we addressed: I have sent a referral for you to get a colonoscopy and also sent a referral to an optometrist for glasses.  Please call a dentist to schedule an appointment with them. I refilled your lisinopril  20 mg to the [facility] pharmacy.  If you still do not receive this medicine in the next few days please give us  a call. Please bring all of your medications to your next appointment so we can make sure that they are accurate in our computer system. Please bring your new patient paperwork, DNR paperwork with you next time.  I have also given you an advance directive packet to fill out at your convenience.   If you haven't already, sign up for My Chart to have easy access to your labs results, and communication with your primary care physician.  We are checking some labs today. If they are abnormal, I will call you. If they are normal, I will send you a MyChart message (if it is active) or a letter in the mail. If you do not hear about your labs in the next 2 weeks, please call the office.  You should return to our clinic Return in about 6 months (around 01/11/2024) for follow up. Please arrive 15 minutes before your appointment to ensure smooth check in process.  We appreciate your efforts in making this happen.  Thank you for allowing me to participate in your care, [name], DO 07/14/2023, 11:36 AM PGY-1, [facility]

## 2023-07-14 NOTE — Assessment & Plan Note (Signed)
 Blood pressure elevated today, patient states he has been out of his lisinopril  for about a month.  I reordered this for him today.  No complaints or concerns today.  Reviewed his medical history.  -Referral for colonoscopy placed today -Referral to optometry placed today -Advised to call dentist to establish care -Provided with HCPOA paperwork to fill out -Advised to bring the DNR paperwork to his next appointment along with all of his medications -Will come back next week for lab work

## 2023-07-19 ENCOUNTER — Other Ambulatory Visit: Payer: Medicaid Other

## 2023-07-19 DIAGNOSIS — Z0000 Encounter for general adult medical examination without abnormal findings: Secondary | ICD-10-CM

## 2023-07-19 NOTE — Addendum Note (Signed)
 Addended by: Avanell Bob E on: 07/19/2023 08:50 AM   Modules accepted: Orders

## 2023-07-20 ENCOUNTER — Telehealth: Payer: Self-pay

## 2023-07-20 ENCOUNTER — Other Ambulatory Visit: Payer: Self-pay | Admitting: Family Medicine

## 2023-07-20 ENCOUNTER — Telehealth: Payer: Self-pay | Admitting: Family Medicine

## 2023-07-20 DIAGNOSIS — E039 Hypothyroidism, unspecified: Secondary | ICD-10-CM

## 2023-07-20 LAB — LIPID PANEL
Chol/HDL Ratio: 2.9 {ratio} (ref 0.0–5.0)
Cholesterol, Total: 141 mg/dL (ref 100–199)
HDL: 48 mg/dL (ref >39)
LDL Chol Calc (NIH): 81 mg/dL (ref 0–99)
Triglycerides: 57 mg/dL (ref 0–149)
VLDL Cholesterol Cal: 12 mg/dL (ref 5–40)

## 2023-07-20 LAB — COMPREHENSIVE METABOLIC PANEL
ALT: 18 [IU]/L (ref 0–44)
AST: 24 [IU]/L (ref 0–40)
Albumin: 4 g/dL (ref 3.8–4.8)
Alkaline Phosphatase: 65 [IU]/L (ref 44–121)
BUN/Creatinine Ratio: 12 (ref 10–24)
BUN: 24 mg/dL (ref 8–27)
Bilirubin Total: 0.3 mg/dL (ref 0.0–1.2)
CO2: 23 mmol/L (ref 20–29)
Calcium: 9.5 mg/dL (ref 8.6–10.2)
Chloride: 104 mmol/L (ref 96–106)
Creatinine, Ser: 1.93 mg/dL — ABNORMAL HIGH (ref 0.76–1.27)
Globulin, Total: 3.9 g/dL (ref 1.5–4.5)
Glucose: 106 mg/dL — ABNORMAL HIGH (ref 70–99)
Potassium: 4.6 mmol/L (ref 3.5–5.2)
Sodium: 140 mmol/L (ref 134–144)
Total Protein: 7.9 g/dL (ref 6.0–8.5)
eGFR: 36 mL/min/{1.73_m2} — ABNORMAL LOW (ref >59)

## 2023-07-20 LAB — T4F: T4,Free (Direct): 0.81 ng/dL — ABNORMAL LOW (ref 0.82–1.77)

## 2023-07-20 LAB — CBC
Hematocrit: 38 % (ref 37.5–51.0)
Hemoglobin: 12.2 g/dL — ABNORMAL LOW (ref 13.0–17.7)
MCH: 24.9 pg — ABNORMAL LOW (ref 26.6–33.0)
MCHC: 32.1 g/dL (ref 31.5–35.7)
MCV: 78 fL — ABNORMAL LOW (ref 79–97)
Platelets: 243 10*3/uL (ref 150–450)
RBC: 4.9 x10E6/uL (ref 4.14–5.80)
RDW: 14.5 % (ref 11.6–15.4)
WBC: 5.2 10*3/uL (ref 3.4–10.8)

## 2023-07-20 LAB — PSA: Prostate Specific Ag, Serum: 3 ng/mL (ref 0.0–4.0)

## 2023-07-20 LAB — TSH RFX ON ABNORMAL TO FREE T4: TSH: 14.2 u[IU]/mL — ABNORMAL HIGH (ref 0.450–4.500)

## 2023-07-20 NOTE — Telephone Encounter (Signed)
Called pt and confirmed ID by DOB.  Discussed all lab results with pt. Extra attention given to elevated TSH/low T4. Pt confirms he typically takes once daily before breakfast. However he reports he was out of his levothyroxine for several weeks prior to his new pt appt with Dr. Rexene Alberts and getting his labwork done. He has only restarted his l;levothyroxine 2 days ago. In light of this I suspect his TSH elevation should correct once he has been back on his medication. Discussed this with pt, and no medication changes indicated today. Pt agreeable to return in 4-6 weeks to repeat TSH.

## 2023-07-20 NOTE — Telephone Encounter (Signed)
Called patient to schedule a lab appointment for a TSH recheck.  Provided patient with the available date and patient chose the time of appointment.  Appointment date and time 08/17/2023 at 10:30am for lab.  Drusilla Kanner, CMA

## 2023-07-20 NOTE — Telephone Encounter (Signed)
-----   Message from Cyndia Skeeters sent at 07/20/2023  9:15 AM EST ----- Regarding: needs lab appt Please call this pt to schedule lab appt in 4-6 weeks for TSH recheck.  Thanks! Maralyn Sago

## 2023-08-17 ENCOUNTER — Other Ambulatory Visit: Payer: Medicaid Other

## 2023-08-17 DIAGNOSIS — E039 Hypothyroidism, unspecified: Secondary | ICD-10-CM | POA: Diagnosis not present

## 2023-08-18 LAB — TSH RFX ON ABNORMAL TO FREE T4: TSH: 4.09 u[IU]/mL (ref 0.450–4.500)

## 2023-08-19 ENCOUNTER — Encounter: Payer: Self-pay | Admitting: Family Medicine

## 2023-08-25 ENCOUNTER — Ambulatory Visit (HOSPITAL_COMMUNITY)
Admission: EM | Admit: 2023-08-25 | Discharge: 2023-08-25 | Disposition: A | Discharge status: Home / Self Care | Attending: Nurse Practitioner | Admitting: Nurse Practitioner

## 2023-08-25 ENCOUNTER — Other Ambulatory Visit: Payer: Self-pay

## 2023-08-25 ENCOUNTER — Encounter (HOSPITAL_COMMUNITY): Payer: Self-pay | Admitting: *Deleted

## 2023-08-25 DIAGNOSIS — M79672 Pain in left foot: Secondary | ICD-10-CM | POA: Diagnosis not present

## 2023-08-25 MED ORDER — PREDNISONE 20 MG PO TABS: ORAL_TABLET | ORAL | 0 refills | Status: DC

## 2023-08-25 NOTE — ED Triage Notes (Signed)
 PT has had LT foot pain since Saturday. Pt reports Lt foot is swollen.

## 2023-08-25 NOTE — Discharge Instructions (Addendum)
 We are treating you for inflammation in your left foot today with prednisone.  Please take the medication as prescribed.  If symptoms do not significantly improve with treatment, recommend follow up with Podiatrist; contact information has been provided.

## 2023-08-25 NOTE — ED Provider Notes (Signed)
 [facility]    CSN: [id number] Arrival date & time: 08/25/23  1226      History   Chief Complaint Chief Complaint  Patient presents with   Foot Pain    HPI [name] is a 73 y.o. male.   Patients presents today for 3-4 day history of left foot pain in the arch of the foot.  Reports the pain began when he woke up one morning and started walking on his foot.  He denies recent fall, trauma, or known injury to the foot.  No new numbness or tingling in the toes.  He endorses generalized swelling of the foot and pain that occasionally shoots up the back of his left leg to his hip.  No fever or nausea/vomiting since the pain began.  Reports history of achilles tendon injury of the left foot, denies any other injuries or causes of left foot pain.  Has tried a compression sleeve/foam support of the left foot with some mild improvement.  Has taken Tylenol for the pain without relief.      Past Medical History:  Diagnosis Date   Chronic kidney disease, stage 3 (HCC)    Chronic pancreatitis (HCC)    Colon polyps    GERD (gastroesophageal reflux disease)    Hyperlipidemia    Hypertension    Hypothyroidism    Portal vein thrombosis    previously on Coumadin, no longer requiring anticoag   Prediabetes    Stroke (HCC)    2004, L sided weakness (no residual)   Vitamin D deficiency     Patient Active Problem List   Diagnosis Date Noted   Encounter for medical examination to establish care 07/14/2023   Hypothyroidism 01/05/2021   Portal vein thrombosis 01/05/2021   CKD (chronic kidney disease), stage III (HCC) 01/29/2012   Hypertension 01/29/2012   Pancreatitis    Hyperlipidemia    Colon polyps     Past Surgical History:  Procedure Laterality Date   CHOLECYSTECTOMY     NECK SURGERY     bone spurs   SHOULDER ARTHROSCOPY Right 12/07/2013   THYROIDECTOMY         Home Medications    Prior to Admission medications   Medication Sig Start Date End Date  Taking? Authorizing Provider  atenolol (TENORMIN) 25 MG tablet Take 25 mg by mouth daily.   Yes [provider]  atorvastatin (LIPITOR) 80 MG tablet Take 40 mg by mouth daily.   Yes [provider]  calcium carbonate (TUMS - DOSED IN MG ELEMENTAL CALCIUM) 500 MG chewable tablet Chew 1 tablet by mouth daily.   Yes [provider]  carboxymethylcellulose 1 % ophthalmic solution Apply 1 drop to eye 4 (four) times daily as needed (for eye irritation).   Yes [provider]  carvedilol (COREG) 6.25 MG tablet Take 6.25 mg by mouth 2 (two) times daily with a meal.   Yes [provider]  cetirizine (ZYRTEC) 10 MG tablet Take 10 mg by mouth daily as needed for allergies.   Yes [provider]  cholecalciferol (VITAMIN D) 25 MCG (1000 UNIT) tablet Take 1,000 Units by mouth daily.   Yes [provider]  dorzolamide-timolol (COSOPT) 22.3-6.8 MG/ML ophthalmic solution Place 1 drop into both eyes 2 (two) times daily.   Yes [provider]  famotidine (PEPCID) 20 MG tablet Take 20 mg by mouth 2 (two) times daily.   Yes [provider]  hydrochlorothiazide (HYDRODIURIL) 25 MG tablet Take 25 mg by  mouth daily.   Yes [provider]  levothyroxine (SYNTHROID) 25 MCG tablet Take 75 mcg by mouth daily before breakfast.   Yes [provider]  lipase/protease/amylase (CREON) 36000 UNITS CPEP capsule Take 36,000-72,000 Units by mouth See admin instructions. Take 2 capsules (72000 units) by mouth   Yes [provider]  lisinopril (ZESTRIL) 20 MG tablet Take 1 tablet (20 mg total) by mouth at bedtime. 07/14/23  Yes Everhart, Kirstie, DO  magnesium oxide (MAG-OX) 400 MG tablet Take 400 mg by mouth 2 (two) times daily.   Yes [provider]  omeprazole (PRILOSEC) 20 MG capsule Take 20 mg by mouth daily.   Yes [provider]  predniSONE (DELTASONE) 20 MG tablet Take 3 tablets (60mg ) on days 1-2. Take 2  tablets (40mg ) on days 3-4. Take 1 tablet (20mg ) on days 5-6, then stop. 08/25/23  Yes Valentino Nose, NP  acetaminophen (TYLENOL) 500 MG tablet Take 1,000 mg by mouth daily as needed.    [provider]  benzoyl peroxide 10 % gel Apply 1 application topically daily.    [provider]  clindamycin (CLEOCIN) 75 MG/5ML solution Take by mouth 2 (two) times daily. Apply to shoulders, chest, stomach, and back    [provider]  coal tar-salicylic acid 2 % shampoo Apply 1 application topically 3 (three) times a week. Reported on 08/19/2015    [provider]  finasteride (PROSCAR) 5 MG tablet Take 5 mg by mouth daily.    [provider]  latanoprost (XALATAN) 0.005 % ophthalmic solution 1 drop at bedtime.    [provider]  levothyroxine (SYNTHROID, LEVOTHROID) 50 MCG tablet Take 50 mcg by mouth daily.    [provider]  lipase/protease/amylase (CREON-12/PANCREASE) 12000 UNITS CPEP capsule Take 2 capsules by mouth 3 (three) times daily before meals.    [provider]  meclizine (ANTIVERT) 25 MG tablet Take 1 tablet (25 mg total) by mouth 3 (three) times daily as needed for dizziness. 08/19/15   Eber Hong, MD  ondansetron (ZOFRAN) 8 MG tablet Take by mouth every 8 (eight) hours as needed. Reported on 08/19/2015    [provider]  polyvinyl alcohol (ARTIFICIAL TEARS) 1.4 % ophthalmic solution Place 1 drop into both eyes 4 (four) times daily.    [provider]  PSYLLIUM PO Take 15 mLs by mouth daily. Sugar free    [provider]  sertraline (ZOLOFT) 25 MG tablet Take 25-50 mg by mouth See admin instructions. Take one tablet (25 mg) by mouth daily for 7 days, then take two tablets (50 mg) daily    [provider]  sildenafil (VIAGRA) 25 MG tablet Take 12.5 mg by mouth daily as needed (as directed).    [provider]  simvastatin (ZOCOR) 80 MG tablet Take 80 mg by mouth daily.      [provider]  sodium chloride (OCEAN) 0.65 % SOLN nasal spray Place 1 spray into both nostrils as needed for congestion.    [provider]  triamcinolone cream (KENALOG) 0.1 % Apply 1 application topically 2 (two) times daily. Reported on 08/19/2015    [provider]    Family History Family History  Problem Relation Age of Onset   Colon cancer Mother    Colon cancer Sister    Colon cancer Brother     Social History Social History   Tobacco Use   Smoking status: Former    Current packs/day: 0.00    Types:  Cigarettes    Quit date: 01/28/1982    Years since quitting: 41.6   Smokeless tobacco: Never  Vaping Use   Vaping status: Never Used  Substance Use Topics   Alcohol use: No   Drug use: No     Allergies   Aspirin, Aspirin-acetaminophen-caffeine, and Chlorthalidone   Review of Systems Review of Systems Per HPI  Physical Exam Triage Vital Signs ED Triage Vitals  Encounter Vitals Group     BP 08/25/23 1254 (!) 168/108     Systolic BP Percentile --      Diastolic BP Percentile --      Pulse Rate 08/25/23 1254 99     Resp 08/25/23 1254 19     Temp 08/25/23 1254 98.4 F (36.9 C)     Temp src --      SpO2 08/25/23 1254 100 %     Weight --      Height --      Head Circumference --      Peak Flow --      Pain Score 08/25/23 1253 8     Pain Loc --      Pain Education --      Exclude from Growth Chart --    No data found.  Updated Vital Signs BP (!) 168/108   Pulse 99   Temp 98.4 F (36.9 C)   Resp 19   SpO2 100%   Visual Acuity Right Eye Distance:   Left Eye Distance:   Bilateral Distance:    Right Eye Near:   Left Eye Near:    Bilateral Near:     Physical Exam Vitals and nursing note reviewed.  Constitutional:      General: He is not in acute distress.    Appearance: Normal appearance. He is not toxic-appearing.  HENT:     Mouth/Throat:     Mouth: Mucous membranes are moist.     Pharynx: Oropharynx is clear.   Pulmonary:     Effort: Pulmonary effort is normal. No respiratory distress.  Musculoskeletal:     Comments: Inspection: no swelling, bruising, obvious deformity or redness to left foot or ankle Palpation: tender to palpation sole of left foot and heel; tenderness with light palpation of area; no obvious deformities palpated ROM: Full ROM to left foot and ankle Strength: 5/5 left lower extremity Neurovascular: neurovascularly intact in left lower extremity   Skin:    General: Skin is warm and dry.     Capillary Refill: Capillary refill takes less than 2 seconds.     Coloration: Skin is not jaundiced or pale.     Findings: No erythema.  Neurological:     Mental Status: He is alert and oriented to person, place, and time.  Psychiatric:        Behavior: Behavior is cooperative.      UC Treatments / Results  Labs (all labs ordered are listed, but only abnormal results are displayed) Labs Reviewed - No data to display  EKG   Radiology No results found.  Procedures Procedures (including critical care time)  Medications Ordered in UC Medications - No data to display  Initial Impression / Assessment and Plan / UC Course  I have reviewed the triage vital signs and the nursing notes.  Pertinent labs & imaging results that were available during my care of the patient were reviewed by me and considered in my medical decision making (see chart for details).   Patient is well-appearing, afebrile, not tachycardic, not  tachypneic, oxygenating well on room air.  Patient is mildly hypertensive in urgent care today.  1. Left foot pain Vitals and examination are reassuring  Unclear etiology; differentials include atypical presentation of gout, tendinitis, plantar fascitis Will treat with oral prednisone, recommended follow up with Podiatry if symptoms do not improve with treatment   The patient was given the opportunity to ask questions.  All questions answered to their satisfaction.   The patient is in agreement to this plan.   Final Clinical Impressions(s) / UC Diagnoses   Final diagnoses:  Left foot pain     Discharge Instructions      We are treating you for inflammation in your left foot today with prednisone.  Please take the medication as prescribed.  If symptoms do not significantly improve with treatment, recommend follow up with Podiatrist; contact information has been provided.   ED Prescriptions     Medication Sig Dispense Auth. Provider   predniSONE (DELTASONE) 20 MG tablet Take 3 tablets (60mg ) on days 1-2. Take 2 tablets (40mg ) on days 3-4. Take 1 tablet (20mg ) on days 5-6, then stop. 12 tablet Valentino Nose, NP      PDMP not reviewed this encounter.   Valentino Nose, NP 08/25/23 1347

## 2023-08-31 ENCOUNTER — Encounter: Payer: Self-pay | Admitting: Podiatry

## 2023-08-31 ENCOUNTER — Ambulatory Visit (INDEPENDENT_AMBULATORY_CARE_PROVIDER_SITE_OTHER)

## 2023-08-31 ENCOUNTER — Ambulatory Visit (INDEPENDENT_AMBULATORY_CARE_PROVIDER_SITE_OTHER): Admitting: Podiatry

## 2023-08-31 VITALS — Ht 70.0 in | Wt 168.4 lb

## 2023-08-31 DIAGNOSIS — M722 Plantar fascial fibromatosis: Secondary | ICD-10-CM | POA: Diagnosis not present

## 2023-08-31 DIAGNOSIS — M76822 Posterior tibial tendinitis, left leg: Secondary | ICD-10-CM

## 2023-08-31 DIAGNOSIS — M79674 Pain in right toe(s): Secondary | ICD-10-CM | POA: Diagnosis not present

## 2023-08-31 DIAGNOSIS — B351 Tinea unguium: Secondary | ICD-10-CM | POA: Diagnosis not present

## 2023-08-31 DIAGNOSIS — M7752 Other enthesopathy of left foot: Secondary | ICD-10-CM

## 2023-08-31 DIAGNOSIS — M79675 Pain in left toe(s): Secondary | ICD-10-CM

## 2023-08-31 DIAGNOSIS — M778 Other enthesopathies, not elsewhere classified: Secondary | ICD-10-CM

## 2023-08-31 MED ORDER — PREDNISONE 20 MG PO TABS: ORAL_TABLET | ORAL | 0 refills | Status: DC

## 2023-08-31 NOTE — Patient Instructions (Signed)
 VISIT SUMMARY:  Today, you were seen for foot pain that radiates up your leg when walking or applying pressure. You also mentioned discomfort from ingrown toenails. We discussed your symptoms, reviewed your history, and developed a plan to address your concerns.  YOUR PLAN:  -PLANTAR FASCIITIS AND POSTERIOR TIBIAL TENDONITIS: Plantar fasciitis and posterior tibial tendonitis are conditions that cause pain in the foot due to inflammation of the plantar fascia and posterior tibial tendon. This can be exacerbated by flat feet (pes planus). To manage this, you should complete your current prednisone course and use an ankle brace for support. Physical therapy will help reduce inflammation and strengthen your foot. You should also wear shoes with good arch support and consider using arch supports like Superfeet, Protalus, or Powerstep. Voltaren gel can be applied topically three to four times daily for pain relief.    INSTRUCTIONS:  Please complete your current prednisone course and use the refill prescription if pain persists in two weeks. Start using an ankle brace and begin physical therapy with home exercises. Refer to [facility] for further assistance. Apply Voltaren gel three to four times daily for pain management. Ensure you wear shoes with good arch support and consider using arch supports. For your toenails, follow the recommended nail care and management plan.   Peroneal Tendinopathy Rehab Ask your health care provider which exercises are safe for you. Do exercises exactly as told by your health care provider and adjust them as directed. It is normal to feel mild stretching, pulling, tightness, or discomfort as you do these exercises. Stop right away if you feel sudden pain or your pain gets worse. Do not begin these exercises until told by your health care provider. Stretching and range-of-motion exercises These exercises warm up your muscles and joints and improve  the movement and flexibility of your ankle. These exercises also help to relieve pain and stiffness. Gastroc and soleus stretch, standing  This is an exercise in which you stand on a step and use your body weight to stretch your calf muscles. To do this exercise: Stand on the edge of a step on the ball of your left / right foot. The ball of your foot is on the walking surface, right under your toes. Keep your other foot firmly on the same step. Hold on to the wall, a railing, or a chair for balance. Slowly lift your other foot, allowing your body weight to press your left / right heel down over the edge of the step. You should feel a stretch in your left / right calf (gastrocnemius and soleus). Hold this position for 15 seconds. Return both feet to the step. Repeat this exercise with a slight bend in your left / right knee. Repeat 5 times with your left / right knee straight and 5 times with your left / right knee bent. Complete this exercise 2 times a day. Strengthening exercises These exercises build strength and endurance in your foot and ankle. Endurance is the ability to use your muscles for a long time, even after they get tired. Ankle dorsiflexion with band   Secure a rubber exercise band or tube to an object, such as a table leg, that will not move when the band is pulled. Secure the other end of the band around your left / right foot. Sit on the floor, facing the object with your left / right leg extended. The band or tube should be slightly tense when your foot is relaxed. Slowly  flex your left / right ankle and toes to bring your foot toward you (dorsiflexion). Hold this position for 15 seconds. Let the band or tube slowly pull your foot back to the starting position. Repeat 5 times. Complete this exercise 2 times a day. Ankle eversion Sit on the floor with your legs straight out in front of you. Loop a rubber exercise band or tube around the ball of your left / right foot. The  ball of your foot is on the walking surface, right under your toes. Hold the ends of the band in your hands, or secure the band to a stable object. The band or tube should be slightly tense when your foot is relaxed. Slowly push your foot outward, away from your other leg (eversion). Hold this position for 15 seconds. Slowly return your foot to the starting position. Repeat 5 times. Complete this exercise 2 times a day. Plantar flexion, standing  This exercise is sometimes called standing heel raise. Stand with your feet shoulder-width apart. Place your hands on a wall or table to steady yourself as needed, but try not to use it for support. Keep your weight spread evenly over the width of your feet while you slowly rise up on your toes (plantar flexion). If told by your health care provider: Shift your weight toward your left / right leg until you feel challenged. Stand on your left / right leg only. Hold this position for 15 seconds. Repeat 2 times. Complete this exercise 2 times a day. Single leg stand Without shoes, stand near a railing or in a doorway. You may hold on to the railing or door frame as needed. Stand on your left / right foot. Keep your big toe down on the floor and try to keep your arch lifted. Do not roll to the outside of your foot. If this exercise is too easy, you can try it with your eyes closed or while standing on a pillow. Hold this position for 15 seconds. Repeat 5 times. Complete this exercise 2 times a day. This information is not intended to replace advice given to you by your health care provider. Make sure you discuss any questions you have with your health care provider. Document Revised: 09/13/2018 Document Reviewed: 09/13/2018 Elsevier Patient Education  2020 [employer].

## 2023-08-31 NOTE — Progress Notes (Signed)
 Subjective:  Patient ID: [name], male    DOB: 1951-01-30,  MRN: [id number]  Chief Complaint  Patient presents with   Foot Pain    L foot pain in bottom/sole of foot since 08/21/23, pt states he has a sharp throbbing pain in foot that radiates up his leg, especially when putting pressure on his foot, not a bad when sitting or laying down, no injury to foot.    Discussed the use of AI scribe software for clinical note transcription with the patient, who gave verbal consent to proceed.  History of Present Illness [name] is a 73 year old male who presents with foot pain.  He experiences foot pain that radiates up his leg when walking or applying pressure. The pain is described as shooting, particularly affecting the plantar central band of the foot and along the posterior tibial tendon into the midfoot. It is exacerbated by pressure and movement, such as walking. No low back pain or sciatica.  He has been using a compression sleeve, which provides some relief, but the pain persists. He was previously prescribed prednisone at an urgent care visit, which he took in the afternoon but found it difficult to sleep. He has about three doses left from that prescription.  He has a history of an Achilles tendon tear approximately 20 years ago, which required casting for about three months. He denies any recent injuries or back issues, including sciatica.  He experiences some pain in the big toes due to ingrown thickened and malformed toenails, which he finds bothersome.      Objective:    Physical Exam VASCULAR: DP and PT pulse palpable. Foot is warm and well-perfused. Capillary fill time is brisk. 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 Normal skin turgor, texture, and temperature. No open lesions, rashes, or ulcerations. NEUROLOGIC: Normal sensation to light touch and pressure. No paresthesias on examination. ORTHOPEDIC: Pain on plantar central  band of plantar fascia and along PT tendon into midfoot. Pain with resisted inversion and plantar flexion. Pes planus deformity. No ecchymosis or bruising    Results RADIOLOGY Left foot radiographs: Pes planus deformity, mild arthritic changes in the midfoot, (08/31/2023)   Assessment:   1. Plantar fasciitis, left   2. Posterior tibial tendon dysfunction (PTTD) of left lower extremity   3. Pain due to onychomycosis of toenails of both feet      Plan:  Patient was evaluated and treated and all questions answered.  Assessment and Plan Assessment & Plan Plantar fasciitis and posterior tibial tendonitis He presents with foot pain in the plantar central band of the plantar fascia and along the posterior tibial tendon into the midfoot, exacerbated by pressure and ambulation. Pes planus contributes to inflammation from tendon and ligament strain. NSAIDs are contraindicated due to kidney disease; prednisone is safe and previously used. An ankle brace and physical therapy are recommended for arch support and inflammation reduction. Voltaren gel is suggested as a topical NSAID alternative. Full recovery is anticipated in approximately two months. - Complete current prednisone course and provide refill prescription. If pain persists in two weeks, fill the prescription. - Recommend ankle brace for support. - Initiate physical therapy with home exercises and refer to [facility]. - Advise wearing shoes with good arch support and consider arch supports such as Superfeet, Protailis, or Powerstep. - Suggest Voltaren gel topically three to four times daily for pain management.  Onychomycosis He has thickened and elongated mycotic nails,  primarily affecting the halluces, causing discomfort. -Debridement thickness with a sharp nail nipper to a tolerable level to reduce pain and improve function.      Return in about 8 weeks (around 10/26/2023) for re-check peroneal  tendinitis, recheck plantar fasciitis.

## 2023-09-03 ENCOUNTER — Telehealth: Payer: Self-pay | Admitting: Podiatry

## 2023-09-03 NOTE — Telephone Encounter (Signed)
 Patient called stating Orthopedic Rehabilitation is requesting  referral from Dr. Lilian Kapur for physical therapy for patient. Patient contact telephone number, [phone]

## 2023-09-05 ENCOUNTER — Emergency Department (HOSPITAL_COMMUNITY)

## 2023-09-05 ENCOUNTER — Encounter (HOSPITAL_COMMUNITY): Payer: Self-pay

## 2023-09-05 ENCOUNTER — Inpatient Hospital Stay (HOSPITAL_COMMUNITY)
Admission: EM | Admit: 2023-09-05 | Discharge: 2023-09-10 | DRG: 184 | Disposition: A | Discharge status: Home Health | Attending: Family Medicine | Admitting: Family Medicine

## 2023-09-05 ENCOUNTER — Other Ambulatory Visit: Payer: Self-pay

## 2023-09-05 DIAGNOSIS — R55 Syncope and collapse: Secondary | ICD-10-CM

## 2023-09-05 DIAGNOSIS — Z66 Do not resuscitate: Secondary | ICD-10-CM | POA: Diagnosis present

## 2023-09-05 DIAGNOSIS — Z8673 Personal history of transient ischemic attack (TIA), and cerebral infarction without residual deficits: Secondary | ICD-10-CM

## 2023-09-05 DIAGNOSIS — Z789 Other specified health status: Secondary | ICD-10-CM

## 2023-09-05 DIAGNOSIS — Z860100 Personal history of colon polyps, unspecified: Secondary | ICD-10-CM

## 2023-09-05 DIAGNOSIS — E890 Postprocedural hypothyroidism: Secondary | ICD-10-CM | POA: Diagnosis present

## 2023-09-05 DIAGNOSIS — I5022 Chronic systolic (congestive) heart failure: Secondary | ICD-10-CM | POA: Diagnosis present

## 2023-09-05 DIAGNOSIS — K567 Ileus, unspecified: Secondary | ICD-10-CM | POA: Insufficient documentation

## 2023-09-05 DIAGNOSIS — R9389 Abnormal findings on diagnostic imaging of other specified body structures: Secondary | ICD-10-CM | POA: Diagnosis not present

## 2023-09-05 DIAGNOSIS — Z9049 Acquired absence of other specified parts of digestive tract: Secondary | ICD-10-CM

## 2023-09-05 DIAGNOSIS — I130 Hypertensive heart and chronic kidney disease with heart failure and stage 1 through stage 4 chronic kidney disease, or unspecified chronic kidney disease: Secondary | ICD-10-CM | POA: Diagnosis present

## 2023-09-05 DIAGNOSIS — I6782 Cerebral ischemia: Secondary | ICD-10-CM | POA: Diagnosis not present

## 2023-09-05 DIAGNOSIS — E860 Dehydration: Secondary | ICD-10-CM | POA: Diagnosis present

## 2023-09-05 DIAGNOSIS — R0781 Pleurodynia: Secondary | ICD-10-CM | POA: Diagnosis not present

## 2023-09-05 DIAGNOSIS — Z888 Allergy status to other drugs, medicaments and biological substances status: Secondary | ICD-10-CM | POA: Diagnosis not present

## 2023-09-05 DIAGNOSIS — N179 Acute kidney failure, unspecified: Secondary | ICD-10-CM

## 2023-09-05 DIAGNOSIS — Z87891 Personal history of nicotine dependence: Secondary | ICD-10-CM

## 2023-09-05 DIAGNOSIS — Z79899 Other long term (current) drug therapy: Secondary | ICD-10-CM

## 2023-09-05 DIAGNOSIS — N1832 Chronic kidney disease, stage 3b: Secondary | ICD-10-CM | POA: Diagnosis present

## 2023-09-05 DIAGNOSIS — W182XXA Fall in (into) shower or empty bathtub, initial encounter: Secondary | ICD-10-CM | POA: Diagnosis present

## 2023-09-05 DIAGNOSIS — E785 Hyperlipidemia, unspecified: Secondary | ICD-10-CM | POA: Diagnosis present

## 2023-09-05 DIAGNOSIS — R140 Abdominal distension (gaseous): Secondary | ICD-10-CM | POA: Diagnosis not present

## 2023-09-05 DIAGNOSIS — Z886 Allergy status to analgesic agent status: Secondary | ICD-10-CM

## 2023-09-05 DIAGNOSIS — W19XXXA Unspecified fall, initial encounter: Secondary | ICD-10-CM | POA: Diagnosis present

## 2023-09-05 DIAGNOSIS — Z79890 Hormone replacement therapy (postmenopausal): Secondary | ICD-10-CM

## 2023-09-05 DIAGNOSIS — K861 Other chronic pancreatitis: Secondary | ICD-10-CM | POA: Diagnosis present

## 2023-09-05 DIAGNOSIS — E875 Hyperkalemia: Secondary | ICD-10-CM | POA: Diagnosis present

## 2023-09-05 DIAGNOSIS — K560 Paralytic ileus: Secondary | ICD-10-CM | POA: Diagnosis present

## 2023-09-05 DIAGNOSIS — S0990XA Unspecified injury of head, initial encounter: Secondary | ICD-10-CM | POA: Diagnosis not present

## 2023-09-05 DIAGNOSIS — K219 Gastro-esophageal reflux disease without esophagitis: Secondary | ICD-10-CM | POA: Diagnosis present

## 2023-09-05 DIAGNOSIS — I517 Cardiomegaly: Secondary | ICD-10-CM | POA: Diagnosis not present

## 2023-09-05 DIAGNOSIS — Y93E1 Activity, personal bathing and showering: Secondary | ICD-10-CM | POA: Diagnosis not present

## 2023-09-05 DIAGNOSIS — S2242XA Multiple fractures of ribs, left side, initial encounter for closed fracture: Principal | ICD-10-CM

## 2023-09-05 DIAGNOSIS — J9811 Atelectasis: Secondary | ICD-10-CM | POA: Diagnosis not present

## 2023-09-05 DIAGNOSIS — S2232XA Fracture of one rib, left side, initial encounter for closed fracture: Secondary | ICD-10-CM | POA: Diagnosis not present

## 2023-09-05 DIAGNOSIS — S2239XA Fracture of one rib, unspecified side, initial encounter for closed fracture: Secondary | ICD-10-CM

## 2023-09-05 DIAGNOSIS — J984 Other disorders of lung: Secondary | ICD-10-CM | POA: Diagnosis not present

## 2023-09-05 DIAGNOSIS — R39198 Other difficulties with micturition: Secondary | ICD-10-CM | POA: Insufficient documentation

## 2023-09-05 DIAGNOSIS — K6389 Other specified diseases of intestine: Secondary | ICD-10-CM | POA: Diagnosis not present

## 2023-09-05 HISTORY — DX: Fracture of one rib, unspecified side, initial encounter for closed fracture: S22.39XA

## 2023-09-05 LAB — COMPREHENSIVE METABOLIC PANEL WITH GFR
ALT: 33 U/L (ref 0–44)
AST: 32 U/L (ref 15–41)
Albumin: 3.5 g/dL (ref 3.5–5.0)
Alkaline Phosphatase: 53 U/L (ref 38–126)
Anion gap: 9 (ref 5–15)
BUN: 28 mg/dL — ABNORMAL HIGH (ref 8–23)
CO2: 25 mmol/L (ref 22–32)
Calcium: 9.3 mg/dL (ref 8.9–10.3)
Chloride: 99 mmol/L (ref 98–111)
Creatinine, Ser: 2.01 mg/dL — ABNORMAL HIGH (ref 0.61–1.24)
GFR, Estimated: 34 mL/min — ABNORMAL LOW (ref >60)
Glucose, Bld: 140 mg/dL — ABNORMAL HIGH (ref 70–99)
Potassium: 5.4 mmol/L — ABNORMAL HIGH (ref 3.5–5.1)
Sodium: 133 mmol/L — ABNORMAL LOW (ref 135–145)
Total Bilirubin: 0.9 mg/dL (ref 0.0–1.2)
Total Protein: 7.8 g/dL (ref 6.5–8.1)

## 2023-09-05 LAB — CBC
HCT: 46.5 % (ref 39.0–52.0)
Hemoglobin: 14.3 g/dL (ref 13.0–17.0)
MCH: 23.6 pg — ABNORMAL LOW (ref 26.0–34.0)
MCHC: 30.8 g/dL (ref 30.0–36.0)
MCV: 76.6 fL — ABNORMAL LOW (ref 80.0–100.0)
Platelets: 262 10*3/uL (ref 150–400)
RBC: 6.07 MIL/uL — ABNORMAL HIGH (ref 4.22–5.81)
RDW: 15.8 % — ABNORMAL HIGH (ref 11.5–15.5)
WBC: 12.1 10*3/uL — ABNORMAL HIGH (ref 4.0–10.5)
nRBC: 0 % (ref 0.0–0.2)

## 2023-09-05 LAB — BASIC METABOLIC PANEL WITH GFR
Anion gap: 5 (ref 5–15)
BUN: 27 mg/dL — ABNORMAL HIGH (ref 8–23)
CO2: 27 mmol/L (ref 22–32)
Calcium: 8.7 mg/dL — ABNORMAL LOW (ref 8.9–10.3)
Chloride: 104 mmol/L (ref 98–111)
Creatinine, Ser: 1.99 mg/dL — ABNORMAL HIGH (ref 0.61–1.24)
GFR, Estimated: 35 mL/min — ABNORMAL LOW (ref >60)
Glucose, Bld: 156 mg/dL — ABNORMAL HIGH (ref 70–99)
Potassium: 4.6 mmol/L (ref 3.5–5.1)
Sodium: 136 mmol/L (ref 135–145)

## 2023-09-05 LAB — TROPONIN I (HIGH SENSITIVITY)
Troponin I (High Sensitivity): 6 ng/L (ref <18)
Troponin I (High Sensitivity): 8 ng/L (ref <18)

## 2023-09-05 MED ORDER — POLYETHYLENE GLYCOL 3350 17 G PO PACK
17.0000 g | PACK | Freq: Every day | ORAL | Status: DC
  Administered 2023-09-05 – 2023-09-06 (×2): 17 g via ORAL
  Filled 2023-09-05 (×2): qty 1

## 2023-09-05 MED ORDER — ENOXAPARIN SODIUM 40 MG/0.4ML IJ SOSY
40.0000 mg | PREFILLED_SYRINGE | INTRAMUSCULAR | Status: DC
Start: 2023-09-05 — End: 2023-09-07
  Administered 2023-09-05: 40 mg via SUBCUTANEOUS
  Filled 2023-09-05 (×2): qty 0.4

## 2023-09-05 MED ORDER — METHOCARBAMOL 500 MG PO TABS
500.0000 mg | ORAL_TABLET | Freq: Four times a day (QID) | ORAL | Status: DC
  Administered 2023-09-05: 500 mg via ORAL
  Filled 2023-09-05: qty 1

## 2023-09-05 MED ORDER — SODIUM CHLORIDE 0.9 % IV BOLUS
1000.0000 mL | Freq: Once | INTRAVENOUS | Status: AC
Start: 2023-09-05 — End: 2023-09-05
  Administered 2023-09-05: 1000 mL via INTRAVENOUS

## 2023-09-05 MED ORDER — OXYCODONE HCL 5 MG PO TABS: 2.5000 mg | ORAL_TABLET | Freq: Four times a day (QID) | ORAL | Status: DC

## 2023-09-05 MED ORDER — MORPHINE SULFATE (PF) 2 MG/ML IV SOLN
2.0000 mg | Freq: Once | INTRAVENOUS | Status: AC
  Administered 2023-09-05: 2 mg via INTRAVENOUS
  Filled 2023-09-05: qty 1

## 2023-09-05 MED ORDER — POLYVINYL ALCOHOL 1.4 % OP SOLN
1.0000 [drp] | Freq: Four times a day (QID) | OPHTHALMIC | Status: DC
  Administered 2023-09-06 – 2023-09-07 (×5): 1 [drp] via OPHTHALMIC
  Filled 2023-09-05: qty 15

## 2023-09-05 MED ORDER — OXYCODONE HCL 5 MG PO TABS: 10.0000 mg | ORAL_TABLET | Freq: Four times a day (QID) | ORAL | Status: DC

## 2023-09-05 MED ORDER — ATORVASTATIN CALCIUM 40 MG PO TABS
40.0000 mg | ORAL_TABLET | Freq: Every day | ORAL | Status: DC
  Administered 2023-09-06 – 2023-09-10 (×5): 40 mg via ORAL
  Filled 2023-09-05 (×5): qty 1

## 2023-09-05 MED ORDER — OXYCODONE HCL 5 MG PO TABS: 5.0000 mg | ORAL_TABLET | Freq: Four times a day (QID) | ORAL | Status: DC

## 2023-09-05 MED ORDER — FENTANYL CITRATE PF 50 MCG/ML IJ SOSY: 50.0000 ug | PREFILLED_SYRINGE | Freq: Once | INTRAMUSCULAR | Status: DC

## 2023-09-05 MED ORDER — ACETAMINOPHEN 325 MG PO TABS
650.0000 mg | ORAL_TABLET | Freq: Four times a day (QID) | ORAL | Status: DC
  Administered 2023-09-05 (×2): 650 mg via ORAL
  Filled 2023-09-05 (×2): qty 2

## 2023-09-05 MED ORDER — HYDROMORPHONE HCL 1 MG/ML IJ SOLN
0.5000 mg | Freq: Once | INTRAMUSCULAR | Status: AC
  Administered 2023-09-05: 0.5 mg via INTRAVENOUS
  Filled 2023-09-05: qty 1

## 2023-09-05 MED ORDER — LIDOCAINE 5 % EX PTCH
1.0000 | MEDICATED_PATCH | Freq: Once | CUTANEOUS | Status: AC
  Administered 2023-09-05: 1 via TRANSDERMAL
  Filled 2023-09-05: qty 1

## 2023-09-05 MED ORDER — METHOCARBAMOL 500 MG PO TABS: 500.0000 mg | ORAL_TABLET | Freq: Four times a day (QID) | ORAL | Status: DC | PRN

## 2023-09-05 MED ORDER — ACETAMINOPHEN 500 MG PO TABS: 1000.0000 mg | ORAL_TABLET | Freq: Four times a day (QID) | ORAL | Status: DC | PRN

## 2023-09-05 MED ORDER — ACETAMINOPHEN 650 MG RE SUPP: 650.0000 mg | Freq: Four times a day (QID) | RECTAL | Status: DC | PRN

## 2023-09-05 MED ORDER — LISINOPRIL 20 MG PO TABS
20.0000 mg | ORAL_TABLET | Freq: Every day | ORAL | Status: DC
  Administered 2023-09-05 – 2023-09-06 (×2): 20 mg via ORAL
  Filled 2023-09-05 (×2): qty 1

## 2023-09-05 MED ORDER — HYDROMORPHONE HCL 1 MG/ML IJ SOLN: 1.0000 mg | INTRAMUSCULAR | Status: DC | PRN

## 2023-09-05 MED ORDER — HYDROMORPHONE HCL 1 MG/ML IJ SOLN
0.5000 mg | Freq: Four times a day (QID) | INTRAMUSCULAR | Status: DC | PRN
  Administered 2023-09-07 (×2): 0.5 mg via INTRAVENOUS
  Filled 2023-09-05 (×2): qty 0.5

## 2023-09-05 MED ORDER — MAGNESIUM OXIDE -MG SUPPLEMENT 400 (240 MG) MG PO TABS
400.0000 mg | ORAL_TABLET | Freq: Two times a day (BID) | ORAL | Status: DC
  Administered 2023-09-05 – 2023-09-10 (×10): 400 mg via ORAL
  Filled 2023-09-05 (×10): qty 1

## 2023-09-05 MED ORDER — LEVOTHYROXINE SODIUM 75 MCG PO TABS
75.0000 ug | ORAL_TABLET | Freq: Every day | ORAL | Status: DC
  Administered 2023-09-06 – 2023-09-10 (×5): 75 ug via ORAL
  Filled 2023-09-05 (×5): qty 1

## 2023-09-05 MED ORDER — POLYVINYL ALCOHOL 1.4 % OP SOLN: 1.0000 [drp] | Freq: Four times a day (QID) | OPHTHALMIC | Status: DC | PRN

## 2023-09-05 MED ORDER — VITAMIN D 25 MCG (1000 UNIT) PO TABS
1000.0000 [IU] | ORAL_TABLET | Freq: Every day | ORAL | Status: DC
  Administered 2023-09-06 – 2023-09-10 (×5): 1000 [IU] via ORAL
  Filled 2023-09-05 (×5): qty 1

## 2023-09-05 MED ORDER — TIZANIDINE HCL 4 MG PO TABS
2.0000 mg | ORAL_TABLET | Freq: Three times a day (TID) | ORAL | Status: DC
  Administered 2023-09-05 (×2): 2 mg via ORAL
  Filled 2023-09-05 (×2): qty 1

## 2023-09-05 MED ORDER — OXYCODONE HCL 5 MG PO TABS: 5.0000 mg | ORAL_TABLET | ORAL | Status: DC | PRN

## 2023-09-05 MED ORDER — FAMOTIDINE 20 MG PO TABS
20.0000 mg | ORAL_TABLET | Freq: Two times a day (BID) | ORAL | Status: DC
  Administered 2023-09-05 – 2023-09-07 (×4): 20 mg via ORAL
  Filled 2023-09-05 (×4): qty 1

## 2023-09-05 MED ORDER — OXYCODONE HCL 5 MG PO TABS
5.0000 mg | ORAL_TABLET | Freq: Four times a day (QID) | ORAL | Status: DC
  Administered 2023-09-05 – 2023-09-06 (×3): 5 mg via ORAL
  Filled 2023-09-05 (×3): qty 1

## 2023-09-05 NOTE — Assessment & Plan Note (Addendum)
 Hypothyroidism: Last TSH 2 weeks ago WNL 4.090; continue home Synthroid 75 mcg daily. HTN: Continue home lisinopril 20 mg daily HLD: Continue home atorvastatin 40 mg daily GERD: Continue home Pepcid 20 mg twice daily, magnesium oxide 400 mg twice daily

## 2023-09-05 NOTE — Assessment & Plan Note (Addendum)
 Most suspicious for vasovagal syncope.  Low suspicion for cardiac etiology, however will initiate cardiac monitoring.  He has had syncope/near syncope and vertigo in the past.  Last echocardiogram in 2022 (during admission for syncopal episode) was without significant abnormality. S/p 1 L NS IV fluids in the ED. -Admit to Med tele with FMTS, Dr. McDiarmid attending -Continuous cardiac monitoring x 24 hours -orthostatic vitals -PT/OT eval and treat -Fall precautions - AM CBC, BMP -Encourage p.o. intake

## 2023-09-05 NOTE — ED Notes (Signed)
 Pt to CT

## 2023-09-05 NOTE — Consult Note (Signed)
 Reason for Consult: fall and ribs fxs Referring Physician: Dr/ young  [name] is an 73 y.o. male.  HPI: 63 M s/p fall in the shower and fell on his left sie of his abd. Pt states he did black out.  He states he hit the shelf in the shower.  He underwent CXR which shows mult rib fxs    Past Medical History:  Diagnosis Date   Chronic kidney disease, stage 3 (HCC)    Chronic pancreatitis (HCC)    Colon polyps    GERD (gastroesophageal reflux disease)    Hyperlipidemia    Hypertension    Hypothyroidism    Portal vein thrombosis    previously on Coumadin, no longer requiring anticoag   Prediabetes    Stroke (HCC)    2004, L sided weakness (no residual)   Vitamin D deficiency     Past Surgical History:  Procedure Laterality Date   CHOLECYSTECTOMY     NECK SURGERY     bone spurs   SHOULDER ARTHROSCOPY Right 12/07/2013   THYROIDECTOMY      Family History  Problem Relation Age of Onset   Colon cancer Mother    Colon cancer Sister    Colon cancer Brother     Social History:  reports that he quit smoking about 41 years ago. His smoking use included cigarettes. He has never used smokeless tobacco. He reports that he does not drink alcohol and does not use drugs.  Allergies:  Allergies  Allergen Reactions   Aspirin Other (See Comments)    Stomach upset   Aspirin-Acetaminophen-Caffeine Nausea Only and Other (See Comments)    Other reaction(s): Hyperactive behavior, Nausea   Chlorthalidone Diarrhea and Other (See Comments)    Low blood pressure    Medications: I have reviewed the patient's current medications.  Results for orders placed or performed during the hospital encounter of 09/05/23 (from the past 48 hours)  CBC     Status: Abnormal   Collection Time: 09/05/23 10:10 AM  Result Value Ref Range   WBC 12.1 (H) 4.0 - 10.5 K/uL   RBC 6.07 (H) 4.22 - 5.81 MIL/uL   Hemoglobin 14.3 13.0 - 17.0 g/dL   HCT 32.2 02.5 - 42.7 %   MCV 76.6 (L) 80.0 - 100.0 fL    MCH 23.6 (L) 26.0 - 34.0 pg   MCHC 30.8 30.0 - 36.0 g/dL   RDW 06.2 (H) 37.6 - 28.3 %   Platelets 262 150 - 400 K/uL   nRBC 0.0 0.0 - 0.2 %    Comment: Performed at [facility], [address], [place], [place] [address]  Comprehensive metabolic panel     Status: Abnormal   Collection Time: 09/05/23 10:10 AM  Result Value Ref Range   Sodium 133 (L) 135 - 145 mmol/L   Potassium 5.4 (H) 3.5 - 5.1 mmol/L    Comment: HEMOLYSIS AT THIS LEVEL MAY AFFECT RESULT   Chloride 99 98 - 111 mmol/L   CO2 25 22 - 32 mmol/L   Glucose, Bld 140 (H) 70 - 99 mg/dL    Comment: Glucose reference range applies only to samples taken after fasting for at least 8 hours.   BUN 28 (H) 8 - 23 mg/dL   Creatinine, Ser 1.60 (H) 0.61 - 1.24 mg/dL   Calcium 9.3 8.9 - 73.7 mg/dL   Total Protein 7.8 6.5 - 8.1 g/dL   Albumin 3.5 3.5 - 5.0 g/dL   AST 32 15 -  41 U/L    Comment: HEMOLYSIS AT THIS LEVEL MAY AFFECT RESULT   ALT 33 0 - 44 U/L    Comment: HEMOLYSIS AT THIS LEVEL MAY AFFECT RESULT   Alkaline Phosphatase 53 38 - 126 U/L   Total Bilirubin 0.9 0.0 - 1.2 mg/dL    Comment: HEMOLYSIS AT THIS LEVEL MAY AFFECT RESULT   GFR, Estimated 34 (L) >60 mL/min    Comment: (NOTE) Calculated using the CKD-EPI Creatinine Equation (2021)    Anion gap 9 5 - 15    Comment: Performed at [facility], [address], [place], [place] [address]  Troponin I (High Sensitivity)     Status: None   Collection Time: 09/05/23 10:10 AM  Result Value Ref Range   Troponin I (High Sensitivity) 6 <18 ng/L    Comment: (NOTE) Elevated high sensitivity troponin I (hsTnI) values and significant  changes across serial measurements may suggest ACS but many other  chronic and acute conditions are known to elevate hsTnI results.  Refer to the "Links" section for chest pain algorithms and additional  guidance. Performed at [facility], [address] [place], [place] [address]     DG Ribs Unilateral W/Chest Left Result  Date: 09/05/2023 CLINICAL DATA:  Syncope. Complains of left clavicle and left flank pain. Trauma to left chest. EXAM: LEFT RIBS AND CHEST - 3+ VIEW COMPARISON:  01/04/2021 and CT of the chest, also obtained today. FINDINGS: Upper limits of normal heart size. Atelectasis noted in both lung bases. No pleural effusion, interstitial edema or pneumothorax. Multiple left posterior and lateral rib fracture deformities are identified which involve the left fifth, sixth, seventh, tenth and eleventh ribs. Nondisplaced left seventh rib fracture is also present. IMPRESSION: 1. Multiple left posterior and lateral rib fracture deformities involving the left fifth, sixth, seventh, tenth and eleventh ribs. 2. Bibasilar atelectasis. Electronically Signed   By: Signa Kell M.D.   On: 09/05/2023 12:30   CT Chest Wo Contrast Result Date: 09/05/2023 CLINICAL DATA:  Chest trauma, blunt. EXAM: CT CHEST WITHOUT CONTRAST TECHNIQUE: Multidetector CT imaging of the chest was performed following the standard protocol without IV contrast. RADIATION DOSE REDUCTION: This exam was performed according to the departmental dose-optimization program which includes automated exposure control, adjustment of the mA and/or kV according to patient size and/or use of iterative reconstruction technique. COMPARISON:  01/05/2019 FINDINGS: Cardiovascular: Heart size is upper limits of normal. Trace pericardial effusion. Aortic atherosclerosis and coronary artery calcifications. Mediastinum/Nodes: Thyroid gland appears surgically absent. Trachea appears patent and midline. Normal esophagus. No enlarged mediastinal or hilar lymph nodes. Lungs/Pleura: No pleural effusion. Bilateral lower lobe, lingular and right middle lobe subsegmental atelectasis with volume loss. No airspace consolidation or pneumothorax. Upper Abdomen: No acute abnormality. Musculoskeletal: There are numerous acute left rib fractures including: posterior aspect of the left fifth, sixth,  seventh, tenth and eleventh ribs. Nondisplaced fracture noted involving the lateral aspect of the left seventh rib. IMPRESSION: 1. Numerous acute left rib fractures including: posterior aspect of the left fifth, sixth, seventh, tenth and eleventh ribs. Nondisplaced fracture noted involving the lateral aspect of the left seventh rib. 2. Bilateral lower lobe, lingular and right middle lobe subsegmental atelectasis with volume loss. 3. No pneumothorax. 4. Coronary artery calcifications. Electronically Signed   By: Signa Kell M.D.   On: 09/05/2023 12:06   CT Head Wo Contrast Result Date: 09/05/2023 CLINICAL DATA:  Head trauma, minor (Age >= 65y) EXAM: CT HEAD WITHOUT CONTRAST TECHNIQUE: Contiguous  axial images were obtained from the base of the skull through the vertex without intravenous contrast. RADIATION DOSE REDUCTION: This exam was performed according to the departmental dose-optimization program which includes automated exposure control, adjustment of the mA and/or kV according to patient size and/or use of iterative reconstruction technique. COMPARISON:  01/05/2021 FINDINGS: Brain: No evidence of acute infarction, hemorrhage, hydrocephalus, extra-axial collection or mass lesion/mass effect. Patchy low-density changes within the periventricular and subcortical white matter most compatible with chronic microvascular ischemic change. Mild diffuse cerebral volume loss. Vascular: Atherosclerotic calcifications involving the large vessels of the skull base. No unexpected hyperdense vessel. Skull: Normal. Negative for fracture or focal lesion. Sinuses/Orbits: No acute finding. Retention cysts in the right maxillary sinus. Other: Negative for scalp hematoma. IMPRESSION: 1. No acute intracranial findings. 2. Chronic microvascular ischemic change and cerebral volume loss. Electronically Signed   By: Duanne Guess D.O.   On: 09/05/2023 10:56    Review of Systems  HENT:  Negative for ear discharge, ear pain,  hearing loss and tinnitus.   Eyes:  Negative for photophobia and pain.  Respiratory:  Negative for cough and shortness of breath.   Cardiovascular:  Positive for chest pain.  Gastrointestinal:  Negative for abdominal pain, nausea and vomiting.  Genitourinary:  Negative for dysuria, flank pain, frequency and urgency.  Musculoskeletal:  Negative for back pain, myalgias and neck pain.  Neurological:  Negative for dizziness and headaches.  Hematological:  Does not bruise/bleed easily.  Psychiatric/Behavioral:  The patient is not nervous/anxious.    Blood pressure 107/76, pulse 62, temperature 97.9 F (36.6 C), temperature source Oral, resp. rate 20, SpO2 98%. Physical Exam Constitutional:      Appearance: He is well-developed.     Comments: Conversant No acute distress  HENT:     Head: Normocephalic and atraumatic.  Eyes:     General: Lids are normal. No scleral icterus.    Pupils: Pupils are equal, round, and reactive to light.     Comments: Pupils are equal round and reactive No lid lag Moist conjunctiva  Neck:     Thyroid: No thyromegaly.     Trachea: No tracheal tenderness.     Comments: No cervical lymphadenopathy Cardiovascular:     Rate and Rhythm: Normal rate and regular rhythm.     Heart sounds: No murmur heard. Pulmonary:     Effort: Pulmonary effort is normal.     Breath sounds: Normal breath sounds. No wheezing or rales.  Chest:     Chest wall: Tenderness (left sided) present.  Abdominal:     Tenderness: There is no abdominal tenderness.     Hernia: No hernia is present.  Musculoskeletal:     Cervical back: Normal range of motion and neck supple.  Skin:    General: Skin is warm.     Findings: No rash.     Nails: There is no clubbing.     Comments: Normal skin turgor  Neurological:     Mental Status: He is alert and oriented to person, place, and time.     Comments: Normal gait and station  Psychiatric:        Mood and Affect: Mood normal.        Thought  Content: Thought content normal.        Judgment: Judgment normal.     Comments: Appropriate affect     Assessment/Plan: 3 M s/p syncope and fall with rib fxs (L 6-7, 10-11) -would rec medical admit for syncopal workup -will  plan on pain control and pulm toilet -mobilize -will follow along  Axel Filler 09/05/2023, 1:20 PM

## 2023-09-05 NOTE — Assessment & Plan Note (Addendum)
 Left ribs 6-7, 10-11.  Fortunately no evidence of pneumothorax on CT chest.  S/p 2 mg morphine, 0.5 mg Dilaudid in the ED. Trauma surgery consulted by the ED and following along. - Pain regimen: Tylenol 1000 mg q6 PRN, scheduled oxycodone 10 mg q6, Dilaudid 1 mg q3 PRN for breakthrough pain. -Robaxin 500 mg q6 scheduled -Taper pain regimen as appropriate -Discussed importance of good pulmonary toileting with patient; start incentive spirometry tomorrow 3/31 once pain is better controlled

## 2023-09-05 NOTE — H&P (Signed)
 Hospital Admission History and Physical Service Pager: [phone]  Patient name: [name] Medical record number: [id number] Date of Birth: [birth year] Age: 73 y.o. Gender: male  Primary Care Provider: Para March, DO Consultants: Trauma Surgery Code Status: DNR/DNI Preferred Emergency Contact: Prefers Ermalene Searing.  He does have another sister whose contact information is listed in his chart, but he reports that she is currently hospitalized and so would not be a good emergency contact at this particular time.   Name Relation Home Work Mobile   [name],[name] Sister   [phone]        Chief Complaint: syncope  Assessment and Plan: [name] is a 73 y.o. male presenting with syncope and fall earlier today. Differential for presentation of this includes cardiac etiology entheses ACS, MI), vasovagal event, seizure.   - Most suspicious for vasovagal versus orthostatic event as patient was just getting into the hot shower.  Patient does report similar feelings of dizziness when bearing down for BM. - Cardiac etiology less likely given normal troponins and unremarkable EKG.  Patient had no prodromal symptoms or chest pain prior to fall. - No increased suspicion of seizure, however this was an unwitnessed event and unclear how long patient was unconscious for, if there were any postictal symptoms.  Assessment & Plan Syncope Most suspicious for vasovagal syncope.  Low suspicion for cardiac etiology, however will initiate cardiac monitoring.  He has had syncope/near syncope and vertigo in the past.  Last echocardiogram in 2022 (during admission for syncopal episode) was without significant abnormality. S/p 1 L NS IV fluids in the ED. -Admit to Med tele with FMTS, Dr. McDiarmid attending -Continuous cardiac monitoring x 24 hours -orthostatic vitals -PT/OT eval and treat -Fall precautions - AM CBC, BMP -Encourage p.o. intake Rib fractures Left ribs 6-7, 10-11.   Fortunately no evidence of pneumothorax on CT chest.  S/p 2 mg morphine, 0.5 mg Dilaudid in the ED. Trauma surgery consulted by the ED and following along. - Pain regimen: Tylenol 1000 mg q6 PRN, scheduled oxycodone 10 mg q6, Dilaudid 1 mg q3 PRN for breakthrough pain. -Robaxin 500 mg q6 scheduled -Taper pain regimen as appropriate -Discussed importance of good pulmonary toileting with patient; start incentive spirometry tomorrow 3/31 once pain is better controlled Chronic health problem Hypothyroidism: Last TSH 2 weeks ago WNL 4.090; continue home Synthroid 75 mcg daily. HTN: Continue home lisinopril 20 mg daily HLD: Continue home atorvastatin 40 mg daily GERD: Continue home Pepcid 20 mg twice daily, magnesium oxide 400 mg twice daily   FEN/GI: Regular diet; MiraLAX scheduled daily VTE Prophylaxis: Lovenox  Disposition: Med telemetry  History of Present Illness:  [name] is a 73 y.o. male presenting with syncopal episode at home while in the shower.  He states he had just gotten in the shower when he suddenly began to feel dizzy like the room was spinning.  He tried to grab onto the bar in his shower but passed out and fell.  He reports he woke up half out of the shower on the floor of his bathroom.  He lives alone, no one witnessed these events.  He laid on the floor for some time until he felt he was strong enough to crawl to the next room to get to a phone.  He called his neighbor who came over to check on him and then called EMS.  He does report 1-2 episodes of dizziness like this each week for the last several months.  This also occurs when he bears down to have a bowel movement.  He denies recent illness, fever, chest pain or pressure, shortness of breath, N/V/D, or change in appetite.  He specifically denies any symptoms prior to this syncopal episode other than the dizziness.  No recent changes to his medications.  In the ED, patient had CT head without abnormality, as well as  CT chest and x-ray which showed multiple rib fractures.  Labs grossly normal, troponins negative.  EKG without evidence of STEMI.  Patient did receive 1 L fluid bolus and morphine for pain.  Trauma surgery was consulted and will follow along.  Patient was called for admission for pain management and further workup of syncopal episode.  Review Of Systems: Per HPI.  Pertinent Past Medical History: CKD stage III GERD HLD HTN Hypothyroidism Prediabetes Stroke (2004 -no residual deficits) Remainder reviewed in history tab.   Pertinent Past Surgical History: Cholecystectomy Thyroidectomy Remainder reviewed in history tab.   Pertinent Social History: Tobacco use: Yes/No/Former Alcohol use: over 30 yrs Other Substance use: No Lives alone  Pertinent Family History: Mother, sister, brother with colon cancer Remainder reviewed in history tab.   Important Outpatient Medications: Lisinopril 20 mg daily Atorvastatin 40 mg daily Pepcid 20 mg twice daily Synthroid 75 mcg daily Remainder reviewed in medication history.   Objective: BP (!) 165/112   Pulse (!) 101   Temp 97.9 F (36.6 C) (Oral)   Resp (!) 24   SpO2 97%  Exam: General: Uncomfortable-appearing, lying on ED gurney, but no acute distress. HEENT: normocephalic, PERRLA, EOM grossly intact, MMM. Cardio: Regular rate, regular rhythm, no murmurs on exam. Pulm: Clear, no wheezing, no crackles anteriorly, though exam is somewhat limited by patient discomfort. No increased work of breathing. Abdominal: bowel sounds present, soft, non-tender, non-distended.  No apparent bruising chest/abdomen where patient fell. Extremities: no peripheral edema. Neuro: Alert and oriented x3, speech normal in content, no facial asymmetry. CN II: PERRL CN III, IV,VI: EOMI CN VIII: Normal hearing Psych:  Cognition and judgment appear intact. Alert, communicative, and cooperative with normal attention span and concentration.  Labs:  CBC BMET   Recent Labs  Lab 09/05/23 1010  WBC 12.1*  HGB 14.3  HCT 46.5  PLT 262   Recent Labs  Lab 09/05/23 1010  NA 133*  K 5.4*  CL 99  CO2 25  BUN 28*  CREATININE 2.01*  GLUCOSE 140*  CALCIUM 9.3     Troponin 6   EKG: Regular rate. P waves present, no widening of QRS or Qtc prolongation. No evidence of ST segment elevation.  3/30 CT Head: IMPRESSION: 1. No acute intracranial findings. 2. Chronic microvascular ischemic change and cerebral volume loss.  3/30 CT Chest: IMPRESSION: 1. Numerous acute left rib fractures including: posterior aspect of the left fifth, sixth, seventh, tenth and eleventh ribs. Nondisplaced fracture noted involving the lateral aspect of the left seventh rib. 2. Bilateral lower lobe, lingular and right middle lobe subsegmental atelectasis with volume loss. 3. No pneumothorax. 4. Coronary artery calcifications.  3/30 L chest/ribs: IMPRESSION: 1. Multiple left posterior and lateral rib fracture deformities involving the left fifth, sixth, seventh, tenth and eleventh ribs. 2. Bibasilar atelectasis.   Cyndia Skeeters, DO 09/05/2023, 2:57 PM PGY-1, [facility]  FPTS Intern pager: [phone], text pages welcome Secure chat group [facility] Teaching Service

## 2023-09-05 NOTE — ED Notes (Signed)
 CCMD called.

## 2023-09-05 NOTE — Progress Notes (Addendum)
 Transition of Care [facility]) - CAGE-AID Screening   Patient Details  Name: [name] MRN: [id number] Date of Birth: 04-01-1951  Hewitt Shorts, RN Trauma Response Nurse Phone Number: [phone] 09/05/2023, 3:20 PM   Clinical Narrative:    CAGE-AID Screening:    Have You Ever Felt You Ought to Cut Down on Your Drinking or Drug Use?: No Have People Annoyed You By Critizing Your Drinking Or Drug Use?: No Have You Felt Bad Or Guilty About Your Drinking Or Drug Use?: No Have You Ever Had a Drink or Used Drugs First Thing In The Morning to Steady Your Nerves or to Get Rid of a Hangover?: No CAGE-AID Score: 0  Substance Abuse Education Offered: (S) No (No services needed, pt denies etoh use)

## 2023-09-05 NOTE — Plan of Care (Signed)
 FMTS Brief Progress Note  S: Evaluated patient at bedside with Dr. Melissa Noon.  Reports he is still having pain in his left ribs, otherwise doing okay.   O: BP 121/85 (BP Location: Left Arm)   Pulse 97   Temp 98.2 F (36.8 C) (Oral)   Resp 18   SpO2 97%   And: Well-appearing, no acute distress, sitting in bed on phone CV: Regular rate and rhythm, no murmurs Respiratory: Breathing comfortably on room air, CTAB, not taking full breaths secondary to pain Abdomen: Normal bowel sounds, soft  A/P: Syncope Suspect vasovagal, history of similar. - Follow-up echo - PT OT  Rib fractures Multiple left posterior rib fractures involving L 5, 6, 7, 10, and 11th ribs.  -Scheduled Tylenol 650 mg every 4 hours, scheduled oxycodone 5 mg every 6 hours - IV Dilaudid 0.5 mg every 6 hours as needed for breakthrough - Lidoderm patch - Advised patient importance of using incentive spirometer as instructed  Rest of plan per day team  - Orders reviewed. Labs for AM ordered, which was adjusted as needed.   Para March, DO 09/05/2023, 8:43 PM PGY-1, [place] [facility] Night Resident  Please page [phone] with questions.

## 2023-09-05 NOTE — ED Notes (Addendum)
 Trauma Event Note   Pt in ED, being admitted to medicine- Dr. Tyron Russell did a consult.  Incentive spirometer given with teaching- was only able to pull 250 first time. In considerable pain . Primary RN showed him how to splint his ribs when deep breathing.    Transferred to 2W  Last imported Vital Signs BP (!) 165/112   Pulse (!) 101   Temp 97.9 F (36.6 C) (Oral)   Resp (!) 24   SpO2 97%   Trending CBC Recent Labs    09/05/23 1010  WBC 12.1*  HGB 14.3  HCT 46.5  PLT 262    Trending Coag's No results for input(s): "APTT", "INR" in the last 72 hours.  Trending BMET Recent Labs    09/05/23 1010  NA 133*  K 5.4*  CL 99  CO2 25  BUN 28*  CREATININE 2.01*  GLUCOSE 140*      [name]  Trauma Response RN  Please call TRN at [phone] for further assistance.

## 2023-09-05 NOTE — ED Triage Notes (Signed)
 Pt BIB [facility] after syncopal episode in the shower. Pt reports he thinks syncopal episode was around 5-10 mins. Pt reports L flank pain, L clavicle pain, but no head or neck pain. Per EMS BP 176 palp, HR 96, RR 20, 98% RA.

## 2023-09-05 NOTE — Hospital Course (Addendum)
 AKIM WATKINSON is a 73 y.o.male with a history of hypothyroidism, HTN, HLD, prior syncopal episodes who was admitted to the [facility] Medicine Teaching Service at [facility] for syncope.   His hospital course is detailed below:  Syncope Patient has had syncope/near syncope in the past, last requiring hospitalization in 2022.  On this admission cardiac workup including EKG, troponins grossly negative.  Echo was obtained which showed***.  Rib fractures Patient found to have fractured left ribs 6-7, 10-11.  No evidence of pneumothorax on chest CT.  Pain was well-managed with Tylenol, oxycodone, Dilaudid.  Patient also had Robaxin.***  Hyperkalemia On presentation potassium elevated to 5.4.  This was rechecked found to***.  Other chronic conditions were medically managed with home medications and formulary alternatives as necessary (HTN, HLD, hypothyroidism, GERD)     PCP Follow-up Recommendations:

## 2023-09-05 NOTE — ED Provider Notes (Signed)
 [place] EMERGENCY DEPARTMENT AT [facility] Provider Note   CSN: [id number] Arrival date & time: 09/05/23  8469     History  Chief Complaint  Patient presents with   Loss of Consciousness   Fall    [name] is a 73 y.o. male.  This is a 73 year old male presenting emergency department after getting lightheaded in the shower and having a syncopal episode.  Denies any chest pain or palpitations prior to passing out, but notes that he suddenly felt lightheaded and the next thing he knew he was on the floor.  Does not think he hit his head, but notes pain to his entire left chest wall.  No shortness of breath.  No abdominal pain.  Denies headache, vision changes, abdominal pain, pain to extremities.   Loss of Consciousness Fall       Home Medications Prior to Admission medications   Medication Sig Start Date End Date Taking? Authorizing Provider  acetaminophen (TYLENOL) 500 MG tablet Take 1,000 mg by mouth daily as needed.   Yes [provider]  atorvastatin (LIPITOR) 80 MG tablet Take 40 mg by mouth daily.   Yes [provider]  calcium carbonate (TUMS - DOSED IN MG ELEMENTAL CALCIUM) 500 MG chewable tablet Chew 1 tablet by mouth daily.   Yes [provider]  carboxymethylcellulose 1 % ophthalmic solution Apply 1 drop to eye 4 (four) times daily as needed (for eye irritation).   Yes [provider]  cetirizine (ZYRTEC) 10 MG tablet Take 10 mg by mouth daily as needed for allergies.   Yes [provider]  cholecalciferol (VITAMIN D) 25 MCG (1000 UNIT) tablet Take 1,000 Units by mouth daily.   Yes [provider]  clindamycin (CLEOCIN) 75 MG/5ML solution Take by mouth 2 (two) times daily. Apply to shoulders, chest, stomach, and back   Yes [provider]  famotidine (PEPCID) 20 MG tablet Take 20 mg by mouth 2 (two) times daily.   Yes [provider]  levothyroxine (SYNTHROID) 75 MCG tablet  Take 75 mcg by mouth daily before breakfast.   Yes [provider]  lisinopril (ZESTRIL) 20 MG tablet Take 1 tablet (20 mg total) by mouth at bedtime. 07/14/23  Yes Everhart, Kirstie, DO  magnesium oxide (MAG-OX) 400 MG tablet Take 400 mg by mouth 2 (two) times daily.   Yes [provider]  meclizine (ANTIVERT) 25 MG tablet Take 1 tablet (25 mg total) by mouth 3 (three) times daily as needed for dizziness. 08/19/15  Yes Eber Hong, MD  polyvinyl alcohol (ARTIFICIAL TEARS) 1.4 % ophthalmic solution Place 1 drop into both eyes 4 (four) times daily.   Yes [provider]  triamcinolone cream (KENALOG) 0.1 % Apply 1 application topically 2 (two) times daily. Reported on 08/19/2015   Yes [provider]  carvedilol (COREG) 6.25 MG tablet Take 6.25 mg by mouth 2 (two) times daily with a meal. Patient not taking: Reported on 09/05/2023    [provider]      Allergies    Aspirin, Aspirin-acetaminophen-caffeine, and Chlorthalidone    Review of Systems   Review of Systems  Cardiovascular:  Positive for syncope.    Physical Exam Updated Vital Signs BP (!) 165/112   Pulse (!) 101   Temp 97.9 F (36.6 C) (Oral)   Resp (!) 24   SpO2 97%  Physical Exam Vitals and nursing note reviewed.  Constitutional:      Appearance: Normal appearance.  HENT:  Nose: Nose normal.     Mouth/Throat:     Mouth: Mucous membranes are dry.  Eyes:     Conjunctiva/sclera: Conjunctivae normal.  Cardiovascular:     Rate and Rhythm: Normal rate and regular rhythm.  Pulmonary:     Effort: Pulmonary effort is normal.     Breath sounds: Normal breath sounds.  Abdominal:     General: Abdomen is flat. There is no distension.     Tenderness: There is no abdominal tenderness. There is no guarding or rebound.  Musculoskeletal:        General: Normal range of motion.     Cervical back: No tenderness.     Comments: Chest wall stable, some minor left chest wall tenderness.   Pelvis stable nontender.  No bony tenderness to extremities.  No obvious lacerations abrasions or contusions.  Skin:    Capillary Refill: Capillary refill takes less than 2 seconds.  Neurological:     General: No focal deficit present.     Mental Status: He is alert and oriented to person, place, and time.  Psychiatric:        Mood and Affect: Mood normal.        Behavior: Behavior normal.     ED Results / Procedures / Treatments   Labs (all labs ordered are listed, but only abnormal results are displayed) Labs Reviewed  CBC - Abnormal; Notable for the following components:      Result Value   WBC 12.1 (*)    RBC 6.07 (*)    MCV 76.6 (*)    MCH 23.6 (*)    RDW 15.8 (*)    All other components within normal limits  COMPREHENSIVE METABOLIC PANEL WITH GFR - Abnormal; Notable for the following components:   Sodium 133 (*)    Potassium 5.4 (*)    Glucose, Bld 140 (*)    BUN 28 (*)    Creatinine, Ser 2.01 (*)    GFR, Estimated 34 (*)    All other components within normal limits  TROPONIN I (HIGH SENSITIVITY)  TROPONIN I (HIGH SENSITIVITY)    EKG EKG Interpretation Date/Time:  Sunday September 05 2023 10:01:38 EDT Ventricular Rate:  92 PR Interval:  187 QRS Duration:  90 QT Interval:  338 QTC Calculation: 419 R Axis:   28  Text Interpretation: Sinus rhythm Abnormal R-wave progression, early transition Borderline repolarization abnormality Confirmed by Estanislado Pandy [phone]) on 09/05/2023 10:06:59 AM  Radiology DG Ribs Unilateral W/Chest Left Result Date: 09/05/2023 CLINICAL DATA:  Syncope. Complains of left clavicle and left flank pain. Trauma to left chest. EXAM: LEFT RIBS AND CHEST - 3+ VIEW COMPARISON:  01/04/2021 and CT of the chest, also obtained today. FINDINGS: Upper limits of normal heart size. Atelectasis noted in both lung bases. No pleural effusion, interstitial edema or pneumothorax. Multiple left posterior and lateral rib fracture deformities are identified which  involve the left fifth, sixth, seventh, tenth and eleventh ribs. Nondisplaced left seventh rib fracture is also present. IMPRESSION: 1. Multiple left posterior and lateral rib fracture deformities involving the left fifth, sixth, seventh, tenth and eleventh ribs. 2. Bibasilar atelectasis. Electronically Signed   By: Signa Kell M.D.   On: 09/05/2023 12:30   CT Chest Wo Contrast Result Date: 09/05/2023 CLINICAL DATA:  Chest trauma, blunt. EXAM: CT CHEST WITHOUT CONTRAST TECHNIQUE: Multidetector CT imaging of the chest was performed following the standard protocol without IV contrast. RADIATION DOSE REDUCTION: This exam was performed according to the departmental dose-optimization program  which includes automated exposure control, adjustment of the mA and/or kV according to patient size and/or use of iterative reconstruction technique. COMPARISON:  01/05/2019 FINDINGS: Cardiovascular: Heart size is upper limits of normal. Trace pericardial effusion. Aortic atherosclerosis and coronary artery calcifications. Mediastinum/Nodes: Thyroid gland appears surgically absent. Trachea appears patent and midline. Normal esophagus. No enlarged mediastinal or hilar lymph nodes. Lungs/Pleura: No pleural effusion. Bilateral lower lobe, lingular and right middle lobe subsegmental atelectasis with volume loss. No airspace consolidation or pneumothorax. Upper Abdomen: No acute abnormality. Musculoskeletal: There are numerous acute left rib fractures including: posterior aspect of the left fifth, sixth, seventh, tenth and eleventh ribs. Nondisplaced fracture noted involving the lateral aspect of the left seventh rib. IMPRESSION: 1. Numerous acute left rib fractures including: posterior aspect of the left fifth, sixth, seventh, tenth and eleventh ribs. Nondisplaced fracture noted involving the lateral aspect of the left seventh rib. 2. Bilateral lower lobe, lingular and right middle lobe subsegmental atelectasis with volume loss. 3.  No pneumothorax. 4. Coronary artery calcifications. Electronically Signed   By: Signa Kell M.D.   On: 09/05/2023 12:06   CT Head Wo Contrast Result Date: 09/05/2023 CLINICAL DATA:  Head trauma, minor (Age >= 65y) EXAM: CT HEAD WITHOUT CONTRAST TECHNIQUE: Contiguous axial images were obtained from the base of the skull through the vertex without intravenous contrast. RADIATION DOSE REDUCTION: This exam was performed according to the departmental dose-optimization program which includes automated exposure control, adjustment of the mA and/or kV according to patient size and/or use of iterative reconstruction technique. COMPARISON:  01/05/2021 FINDINGS: Brain: No evidence of acute infarction, hemorrhage, hydrocephalus, extra-axial collection or mass lesion/mass effect. Patchy low-density changes within the periventricular and subcortical white matter most compatible with chronic microvascular ischemic change. Mild diffuse cerebral volume loss. Vascular: Atherosclerotic calcifications involving the large vessels of the skull base. No unexpected hyperdense vessel. Skull: Normal. Negative for fracture or focal lesion. Sinuses/Orbits: No acute finding. Retention cysts in the right maxillary sinus. Other: Negative for scalp hematoma. IMPRESSION: 1. No acute intracranial findings. 2. Chronic microvascular ischemic change and cerebral volume loss. Electronically Signed   By: Duanne Guess D.O.   On: 09/05/2023 10:56    Procedures Procedures    Medications Ordered in ED Medications  lidocaine (LIDODERM) 5 % 1 patch (1 patch Transdermal Patch Applied 09/05/23 1108)  atorvastatin (LIPITOR) tablet 40 mg (has no administration in time range)  lisinopril (ZESTRIL) tablet 20 mg (has no administration in time range)  levothyroxine (SYNTHROID) tablet 75 mcg (has no administration in time range)  famotidine (PEPCID) tablet 20 mg (has no administration in time range)  magnesium oxide (MAG-OX) tablet 400 mg (has no  administration in time range)  cholecalciferol (VITAMIN D3) 25 MCG (1000 UNIT) tablet 1,000 Units (has no administration in time range)  polyvinyl alcohol (LIQUIFILM TEARS) 1.4 % ophthalmic solution 1 drop (has no administration in time range)  polyvinyl alcohol (LIQUIFILM TEARS) 1.4 % ophthalmic solution 1 drop (has no administration in time range)  enoxaparin (LOVENOX) injection 40 mg (has no administration in time range)  polyethylene glycol (MIRALAX / GLYCOLAX) packet 17 g (has no administration in time range)  HYDROmorphone (DILAUDID) injection 0.5 mg (has no administration in time range)  acetaminophen (TYLENOL) tablet 650 mg (has no administration in time range)  tiZANidine (ZANAFLEX) tablet 2 mg (has no administration in time range)  oxyCODONE (Oxy IR/ROXICODONE) immediate release tablet 5 mg (has no administration in time range)  morphine (PF) 2 MG/ML injection 2 mg (2  mg Intravenous Given 09/05/23 1109)  sodium chloride 0.9 % bolus 1,000 mL (0 mLs Intravenous Stopped 09/05/23 1327)  HYDROmorphone (DILAUDID) injection 0.5 mg (0.5 mg Intravenous Given 09/05/23 1346)    ED Course/ Medical Decision Making/ A&P Clinical Course as of 09/05/23 1536  Sun Sep 05, 2023  1007 EKG 12-Lead EKG on my independent rotation sinus rhythm at a rate of 92.  Normal intervals.  No ST segment changes to indicate ischemia.  No QTc prolongation.  No Brugada [TY]  1008 Echo in 2022 with normal EF per chart review.  [TY]  1055 CBC(!) Minor leukocytosis.  No anemia. [TY]  1105 CT Head Wo Contrast No obvious intracranial trauma on my independent interpretation.  Radiology agrees [TY]  1106 DG Ribs Unilateral W/Chest Left Appears to have some left-sided rib fractures.  No obvious pneumothorax. Will get CT chest.  [TY]  1126 Potassium(!): 5.4 Hemolyzed sample.  [TY]  1218 CT Chest Wo Contrast IMPRESSION: 1. Numerous acute left rib fractures including: posterior aspect of the left fifth, sixth, seventh,  tenth and eleventh ribs. Nondisplaced fracture noted involving the lateral aspect of the left seventh rib. 2. Bilateral lower lobe, lingular and right middle lobe subsegmental atelectasis with volume loss. 3. No pneumothorax. 4. Coronary artery calcifications.   Electronically Signed   [TY]  1535 Case discussed with trauma.  Recommending admission to medicine team with trauma consult.  Patient further requiring pain medications with IV Dilaudid.  Remains hemodynamically stable.  Discussed case with hospitalist who agrees to see and admit patient. [TY]    Clinical Course User Index [TY] Coral Spikes, DO                                 Medical Decision Making This is a 73 year old male presenting to the emergency department after a syncopal episode.  Does have a history of hypertension, hyperlipidemia, prior stroke, hypothyroid CKD, portal vein thrombosis.  He is afebrile, nontachycardic, hypertensive.  Does have some tenderness to his left chest, but equal breath sounds and overall reassuring exam from a trauma standpoint.  Denies cardiac history.  Per chart review appears he has normal EF.  Clinically with dry mucous membranes, but reports good oral intake and no infectious symptoms the past several days.  Will get CT head to evaluate for intracranial trauma.  Will also get x-ray chest and ribs to evaluate for rib fracture. Will get screening cardiac labs for syncope and orthostatics.  Patient does appear to to be quite uncomfortable we will give morphine and lidocaine patch for pain control. See ED course for further MDM and final disposition.  Amount and/or Complexity of Data Reviewed Independent Historian:     Details: EMS reported stable vitals. External Data Reviewed:     Details: See above Labs: ordered. Decision-making details documented in ED Course. Radiology: ordered and independent interpretation performed. Decision-making details documented in ED Course. ECG/medicine  tests:  Decision-making details documented in ED Course.  Risk Prescription drug management. Decision regarding hospitalization.         Final Clinical Impression(s) / ED Diagnoses Final diagnoses:  None    Rx / DC Orders ED Discharge Orders     None         Coral Spikes, DO 09/05/23 1536

## 2023-09-06 ENCOUNTER — Inpatient Hospital Stay (HOSPITAL_COMMUNITY)

## 2023-09-06 ENCOUNTER — Encounter (HOSPITAL_COMMUNITY): Payer: Self-pay | Admitting: Family Medicine

## 2023-09-06 DIAGNOSIS — R55 Syncope and collapse: Secondary | ICD-10-CM

## 2023-09-06 DIAGNOSIS — R39198 Other difficulties with micturition: Secondary | ICD-10-CM | POA: Insufficient documentation

## 2023-09-06 LAB — ECHOCARDIOGRAM COMPLETE
AR max vel: 2.41 cm2
AV Area VTI: 2.35 cm2
AV Area mean vel: 2.24 cm2
AV Mean grad: 2 mmHg
AV Peak grad: 3.4 mmHg
Ao pk vel: 0.93 m/s
Area-P 1/2: 5.13 cm2
Calc EF: 43.2 %
S' Lateral: 3.7 cm
Single Plane A2C EF: 38.9 %
Single Plane A4C EF: 47 %

## 2023-09-06 LAB — BASIC METABOLIC PANEL WITH GFR
Anion gap: 7 (ref 5–15)
BUN: 33 mg/dL — ABNORMAL HIGH (ref 8–23)
CO2: 24 mmol/L (ref 22–32)
Calcium: 8.6 mg/dL — ABNORMAL LOW (ref 8.9–10.3)
Chloride: 106 mmol/L (ref 98–111)
Creatinine, Ser: 2.25 mg/dL — ABNORMAL HIGH (ref 0.61–1.24)
GFR, Estimated: 30 mL/min — ABNORMAL LOW (ref >60)
Glucose, Bld: 115 mg/dL — ABNORMAL HIGH (ref 70–99)
Potassium: 4.7 mmol/L (ref 3.5–5.1)
Sodium: 137 mmol/L (ref 135–145)

## 2023-09-06 LAB — CBC
HCT: 37.8 % — ABNORMAL LOW (ref 39.0–52.0)
Hemoglobin: 11.8 g/dL — ABNORMAL LOW (ref 13.0–17.0)
MCH: 23.6 pg — ABNORMAL LOW (ref 26.0–34.0)
MCHC: 31.2 g/dL (ref 30.0–36.0)
MCV: 75.4 fL — ABNORMAL LOW (ref 80.0–100.0)
Platelets: 247 10*3/uL (ref 150–400)
RBC: 5.01 MIL/uL (ref 4.22–5.81)
RDW: 15.5 % (ref 11.5–15.5)
WBC: 9.6 10*3/uL (ref 4.0–10.5)
nRBC: 0 % (ref 0.0–0.2)

## 2023-09-06 MED ORDER — METHOCARBAMOL 500 MG PO TABS: 500.0000 mg | ORAL_TABLET | Freq: Three times a day (TID) | ORAL | Status: DC

## 2023-09-06 MED ORDER — LIDOCAINE 5 % EX PTCH
2.0000 | MEDICATED_PATCH | Freq: Once | CUTANEOUS | Status: AC
  Administered 2023-09-06: 2 via TRANSDERMAL
  Filled 2023-09-06: qty 2

## 2023-09-06 MED ORDER — SENNA 8.6 MG PO TABS
1.0000 | ORAL_TABLET | Freq: Two times a day (BID) | ORAL | Status: DC
  Administered 2023-09-06 – 2023-09-10 (×9): 8.6 mg via ORAL
  Filled 2023-09-06 (×9): qty 1

## 2023-09-06 MED ORDER — OXYCODONE HCL 5 MG PO TABS
5.0000 mg | ORAL_TABLET | ORAL | Status: DC | PRN
  Filled 2023-09-06: qty 1

## 2023-09-06 MED ORDER — OXYCODONE HCL 5 MG PO TABS
5.0000 mg | ORAL_TABLET | ORAL | Status: DC | PRN
  Administered 2023-09-06: 10 mg via ORAL
  Administered 2023-09-06: 5 mg via ORAL
  Administered 2023-09-06 (×2): 10 mg via ORAL
  Filled 2023-09-06 (×3): qty 2

## 2023-09-06 MED ORDER — METHOCARBAMOL 500 MG PO TABS
500.0000 mg | ORAL_TABLET | Freq: Four times a day (QID) | ORAL | Status: DC
  Administered 2023-09-06 – 2023-09-07 (×5): 500 mg via ORAL
  Filled 2023-09-06 (×5): qty 1

## 2023-09-06 MED ORDER — DOCUSATE SODIUM 100 MG PO CAPS
100.0000 mg | ORAL_CAPSULE | Freq: Two times a day (BID) | ORAL | Status: DC
  Administered 2023-09-06 – 2023-09-07 (×3): 100 mg via ORAL
  Filled 2023-09-06 (×4): qty 1

## 2023-09-06 MED ORDER — ACETAMINOPHEN 500 MG PO TABS
1000.0000 mg | ORAL_TABLET | Freq: Four times a day (QID) | ORAL | Status: DC
  Administered 2023-09-06 – 2023-09-07 (×4): 1000 mg via ORAL
  Filled 2023-09-06 (×4): qty 2

## 2023-09-06 MED ORDER — POLYETHYLENE GLYCOL 3350 17 G PO PACK
17.0000 g | PACK | Freq: Two times a day (BID) | ORAL | Status: DC
  Administered 2023-09-06 – 2023-09-10 (×7): 17 g via ORAL
  Filled 2023-09-06 (×8): qty 1

## 2023-09-06 NOTE — Assessment & Plan Note (Addendum)
 Left ribs 6-7, 10-11.  Pain moderately controlled overnight but pt did have difficulty sleeping. Trauma updated pain regimen recommendations, as below: - Pain regimen: Tylenol 1000 mg QID scheduled, oxycodone 5-10 mg q4 PRN for moderate pain, Dilaudid 0.5 mg q6 PRN for breakthrough pain. - Lidoderm patch x2 to chest wall -Robaxin 500 mg QID scheduled -Taper pain regimen as appropriate -Strongly encouraged incentive spirometry

## 2023-09-06 NOTE — Plan of Care (Signed)

## 2023-09-06 NOTE — Evaluation (Signed)
 Physical Therapy Evaluation Patient Details Name: [name] MRN: [id number] DOB: January 23, 1951 Today's Date: 09/06/2023  History of Present Illness  73 y.o. male presenting 09/05/23 with syncope and fall. Left rib fxs 5-7, 10-11; CT head no acute changes; troponins negative; PMH-CKD, HLD, HTN, prediabetes, Stroke  Clinical Impression  Pt admitted secondary to problem above with deficits below. PTA patient was living alone in an apartment (3rd floor with elevator). He was completely independent with all mobility and driving. Pt currently requires min assist to stand from chair with armrests and min assist to SLOWLY walk 25 ft with right hand-held assist. He reports supportive local family and feels they can arrange to be with him 24/7 on initial discharge if needed. Anticipate patient will benefit from PT to address problems listed below.Will continue to follow acutely to maximize functional mobility independence and safety.  Patient will benefit from continued inpatient follow up therapy, >3 hours/day          If plan is discharge home, recommend the following: Assistance with cooking/housework;Assist for transportation   Can travel by private vehicle        Equipment Recommendations None recommended by PT  Recommendations for Other Services  Rehab consult    Functional Status Assessment Patient has had a recent decline in their functional status and demonstrates the ability to make significant improvements in function in a reasonable and predictable amount of time.     Precautions / Restrictions Precautions Precautions: Fall;Other (comment) Recall of Precautions/Restrictions: Intact Precaution/Restrictions Comments: syncope Restrictions Weight Bearing Restrictions Per Provider Order: No      Mobility  Bed Mobility               General bed mobility comments: up in chair    Transfers Overall transfer level: Needs assistance Equipment used: None Transfers: Sit  to/from Stand Sit to Stand: Min assist           General transfer comment: cues to come to front edge of seat; using Rt armrest    Ambulation/Gait Ambulation/Gait assistance: Min assist Gait Distance (Feet): 25 Feet Assistive device: 1 person hand held assist Gait Pattern/deviations: Step-to pattern, Decreased stride length, Shuffle   Gait velocity interpretation: <1.8 ft/sec, indicate of risk for recurrent falls   General Gait Details: pt moving very rigidly with super small steps and sliding feet along the floor  Stairs            Wheelchair Mobility     Tilt Bed    Modified Rankin (Stroke Patients Only)       Balance Overall balance assessment: Needs assistance Sitting-balance support: No upper extremity supported, Feet supported Sitting balance-Leahy Scale: Fair Sitting balance - Comments: >fair NT due to rib pain   Standing balance support: Single extremity supported Standing balance-Leahy Scale: Poor Standing balance comment: mild dizziness with CGA                             Pertinent Vitals/Pain Pain Assessment Pain Assessment: 0-10 Pain Score: 8  Pain Location: left ribs Pain Descriptors / Indicators: Grimacing, Guarding, Moaning, Sharp Pain Intervention(s): Limited activity within patient's tolerance, Monitored during session, Premedicated before session, Repositioned    Home Living Family/patient expects to be discharged to:: Private residence Living Arrangements: Alone Available Help at Discharge: Family;Available 24 hours/day (nieces, brothers, sisters) Type of Home: Apartment Home Access: Elevator       Home Layout: One level Home Equipment: Other (comment) (walking  stick)      Prior Function Prior Level of Function : Independent/Modified Independent             Mobility Comments: no device; used walking stick for exercise, but hasn't exercised in a few years ADLs Comments: Ind     Extremity/Trunk Assessment    Upper Extremity Assessment Upper Extremity Assessment: Defer to OT evaluation (holds LUE tight against his left ribs)    Lower Extremity Assessment Lower Extremity Assessment: Overall WFL for tasks assessed    Cervical / Trunk Assessment Cervical / Trunk Assessment: Other exceptions Cervical / Trunk Exceptions: broken left ribs with pt moving torso rigidly  Communication   Communication Communication: No apparent difficulties    Cognition Arousal: Alert Behavior During Therapy: WFL for tasks assessed/performed                             Following commands: Intact       Cueing Cueing Techniques: Verbal cues     General Comments General comments (skin integrity, edema, etc.): on RA with sats >90%    Exercises Other Exercises Other Exercises: educated on importance of IS and pt reports he has been using it "the best he can"   Assessment/Plan    PT Assessment Patient needs continued PT services  PT Problem List Decreased range of motion;Decreased activity tolerance;Decreased balance;Decreased mobility;Decreased knowledge of use of DME;Pain       PT Treatment Interventions DME instruction;Gait training;Functional mobility training;Therapeutic activities;Therapeutic exercise;Balance training;Patient/family education    PT Goals (Current goals can be found in the Care Plan section)  Acute Rehab PT Goals Patient Stated Goal: decr pain PT Goal Formulation: With patient Time For Goal Achievement: 09/20/23 Potential to Achieve Goals: Good    Frequency Min 3X/week     Co-evaluation               AM-PAC PT "6 Clicks" Mobility  Outcome Measure Help needed turning from your back to your side while in a flat bed without using bedrails?: A Lot Help needed moving from lying on your back to sitting on the side of a flat bed without using bedrails?: A Lot Help needed moving to and from a bed to a chair (including a wheelchair)?: A Little Help needed  standing up from a chair using your arms (e.g., wheelchair or bedside chair)?: A Little Help needed to walk in hospital room?: A Little Help needed climbing 3-5 steps with a railing? : Total 6 Click Score: 14    End of Session   Activity Tolerance: Patient limited by pain Patient left: in chair;with call bell/phone within reach;with chair alarm set Nurse Communication: Mobility status PT Visit Diagnosis: Other abnormalities of gait and mobility (R26.89);Pain Pain - Right/Left: Left Pain - part of body:  (ribs)    Time: 6578-4696 PT Time Calculation (min) (ACUTE ONLY): 32 min   Charges:   PT Evaluation $PT Eval Low Complexity: 1 Low PT Treatments $Gait Training: 8-22 mins PT General Charges $$ ACUTE PT VISIT: 1 Visit          Jerolyn Center, PT Acute Rehabilitation Services  Office [phone]   Zena Amos 09/06/2023, 3:16 PM

## 2023-09-06 NOTE — Evaluation (Signed)
 Occupational Therapy Evaluation Patient Details Name: [name] MRN: [id number] DOB: Nov 14, 1950 Today's Date: 09/06/2023   History of Present Illness   73 y.o. male presenting 09/05/23 with syncope and fall. Left rib fxs 5-7, 10-11; CT head no acute changes; troponins negative; PMH-CKD, HLD, HTN, prediabetes, Stroke     Clinical Impressions Pt presents with decline in function and safety with ADLs and ADL mobility with impaired strength, balance and endurance; pt limited by pain in L rib fx areas .PTA pt living alone in an apartment (3rd floor with elevator) and was Ind  with ADLs, IADLs, homge mgt, cooking and drives as well and as with all  mobility. Pt currently requires mod A with LB ADLs and toileting, min A with UB ADLs and min A to stand from chair with armrests/SPT to [facility]. He reports supportive local family and feels they can arrange to be with him 24/7 on initial discharge if needed. Pt would benefit from acute OT services to address impairments to maximize level of function and safety    If plan is discharge home, recommend the following:   A little help with walking and/or transfers;Assistance with cooking/housework;A lot of help with bathing/dressing/bathroom;Assist for transportation;Help with stairs or ramp for entrance     Functional Status Assessment   Patient has had a recent decline in their functional status and demonstrates the ability to make significant improvements in function in a reasonable and predictable amount of time.     Equipment Recommendations   Other (comment) (TBD at next venue of care)     Recommendations for Other Services         Precautions/Restrictions   Precautions Precautions: Fall;Other (comment) Recall of Precautions/Restrictions: Intact Precaution/Restrictions Comments: syncope, painful L rib fx areas Restrictions Weight Bearing Restrictions Per Provider Order: No     Mobility Bed Mobility                General bed mobility comments: up in chair    Transfers Overall transfer level: Needs assistance Equipment used: None Transfers: Sit to/from Stand Sit to Stand: Min assist                  Balance Overall balance assessment: Needs assistance     Sitting balance - Comments: due to rib pain   Standing balance support: Single extremity supported Standing balance-Leahy Scale: Poor Standing balance comment: no c/o dizziness                           ADL either performed or assessed with clinical judgement   ADL Overall ADL's : Needs assistance/impaired Eating/Feeding: Independent;Sitting   Grooming: Wash/dry hands;Wash/dry face;Oral care;Sitting   Upper Body Bathing: Minimal assistance Upper Body Bathing Details (indicate cue type and reason): due to L rib fx pain Lower Body Bathing: Moderate assistance Lower Body Bathing Details (indicate cue type and reason): due to L rib fx pain Upper Body Dressing : Minimal assistance Upper Body Dressing Details (indicate cue type and reason): due to pain Lower Body Dressing: Moderate assistance Lower Body Dressing Details (indicate cue type and reason): due to pain Toilet Transfer: Minimal assistance;Stand-pivot;BSC/3in1   Toileting- Clothing Manipulation and Hygiene: Moderate assistance;Sit to/from stand Toileting - Clothing Manipulation Details (indicate cue type and reason): due to pain     Functional mobility during ADLs: Minimal assistance General ADL Comments: pt limited due to pain in rib fx areas, requires increased time with guarded movements for tasks  Vision Ability to See in Adequate Light: 0 Adequate Patient Visual Report: No change from baseline       Perception         Praxis         Pertinent Vitals/Pain Pain Assessment Pain Assessment: Faces Pain Score: 7  Faces Pain Scale: Hurts even more Pain Location: left ribs Pain Descriptors / Indicators: Grimacing, Guarding, Moaning,  Sharp Pain Intervention(s): Limited activity within patient's tolerance, Monitored during session, Premedicated before session, Repositioned     Extremity/Trunk Assessment Upper Extremity Assessment Upper Extremity Assessment: Overall WFL for tasks assessed   Lower Extremity Assessment Lower Extremity Assessment: Defer to PT evaluation   Cervical / Trunk Assessment Cervical / Trunk Assessment: Other exceptions Cervical / Trunk Exceptions: broken left ribs   Communication Communication Communication: No apparent difficulties   Cognition Arousal: Alert Behavior During Therapy: WFL for tasks assessed/performed Cognition: No apparent impairments                               Following commands: Intact       Cueing  General Comments   Cueing Techniques: Verbal cues  on RA with sats >90%   Exercises     Shoulder Instructions      Home Living Family/patient expects to be discharged to:: Private residence Living Arrangements: Alone Available Help at Discharge: Family;Available 24 hours/day Type of Home: Apartment Home Access: Elevator     Home Layout: One level     Bathroom Shower/Tub: Producer, television/film/video: Standard Bathroom Accessibility: Yes   Home Equipment: Other (comment);Shower seat;Grab bars - tub/shower          Prior Functioning/Environment Prior Level of Function : Independent/Modified Independent;Driving             Mobility Comments: no device; used walking stick for exercise, but hasn't exercised in a few years ADLs Comments: Ind with ADLs/selfcare, IADLs, home mgt, cooks, drives    OT Problem List: Impaired balance (sitting and/or standing);Pain;Decreased activity tolerance;Decreased coordination;Decreased knowledge of use of DME or AE   OT Treatment/Interventions: Self-care/ADL training;DME and/or AE instruction;Therapeutic activities;Balance training;Patient/family education      OT Goals(Current goals can  be found in the care plan section)   Acute Rehab OT Goals Patient Stated Goal: less pain OT Goal Formulation: With patient Time For Goal Achievement: 09/20/23 Potential to Achieve Goals: Good ADL Goals Pt Will Perform Grooming: with contact guard assist;with supervision;standing Pt Will Perform Upper Body Bathing: with contact guard assist;with supervision Pt Will Perform Lower Body Bathing: with min assist;with adaptive equipment Pt Will Perform Upper Body Dressing: with contact guard assist;with supervision Pt Will Perform Lower Body Dressing: with min assist;with adaptive equipment Pt Will Transfer to Toilet: with contact guard assist;with supervision;ambulating Pt Will Perform Toileting - Clothing Manipulation and hygiene: with min assist;with contact guard assist;sit to/from stand   OT Frequency:  Min 2X/week    Co-evaluation              AM-PAC OT "6 Clicks" Daily Activity     Outcome Measure Help from another person eating meals?: None Help from another person taking care of personal grooming?: A Little Help from another person toileting, which includes using toliet, bedpan, or urinal?: A Lot Help from another person bathing (including washing, rinsing, drying)?: A Lot Help from another person to put on and taking off regular upper body clothing?: A Lot Help from another person to  put on and taking off regular lower body clothing?: A Lot 6 Click Score: 15   End of Session Equipment Utilized During Treatment: Gait belt;Other (comment) (BSC)  Activity Tolerance: Patient limited by pain Patient left: in chair;with call bell/phone within reach;with family/visitor present  OT Visit Diagnosis: Other abnormalities of gait and mobility (R26.89);History of falling (Z91.81);Pain Pain - Right/Left: Left Pain - part of body:  (rib fxs)                Time: 1610-9604 OT Time Calculation (min): 28 min Charges:  OT General Charges $OT Visit: 1 Visit OT Evaluation $OT Eval  Low Complexity: 1 Low OT Treatments $Therapeutic Activity: 8-22 mins    Galen Manila 09/06/2023, 4:03 PM

## 2023-09-06 NOTE — Addendum Note (Signed)
 Addended byLilian Kapur, Hisayo Delossantos R on: 09/06/2023 02:44 PM   Modules accepted: Orders

## 2023-09-06 NOTE — Progress Notes (Signed)
  Echocardiogram 2D Echocardiogram has been performed.  Reinaldo Raddle Evalyn Shultis 09/06/2023, 2:38 PM

## 2023-09-06 NOTE — Assessment & Plan Note (Signed)
 Hypothyroidism: Continue home Synthroid 75 mcg daily. HTN: Continue home lisinopril 20 mg daily HLD: Continue home atorvastatin 40 mg daily GERD: Continue home Pepcid 20 mg twice daily, magnesium oxide 400 mg twice daily

## 2023-09-06 NOTE — Progress Notes (Signed)
     Daily Progress Note Intern Pager: [phone]  Patient name: [name] Medical record number: [id number] Date of birth: 11-14-1950 Age: 73 y.o. Gender: male  Primary Care Provider: Para March, DO Consultants: Trauma surgery Code Status: DNR/DNI  Pt Overview and Major Events to Date:  3/30-admitted  Assessment and Plan: [name] is a 73 year old male admitted for syncope and fall with multiple broken ribs.  Further syncopal workup and pain management today. Assessment & Plan Syncope Cardiac workup thus far negative, however awaiting echo today for further evaluation. -Continuous cardiac monitoring x 24 hours -orthostatic vitals -PT/OT eval and treat -Fall precautions - AM CBC, BMP -Encourage p.o. intake - Strict I's and O's Rib fractures Left ribs 6-7, 10-11.  Pain moderately controlled overnight but pt did have difficulty sleeping. Trauma updated pain regimen recommendations, as below: - Pain regimen: Tylenol 1000 mg QID scheduled, oxycodone 5-10 mg q4 PRN for moderate pain, Dilaudid 0.5 mg q6 PRN for breakthrough pain. - Lidoderm patch x2 to chest wall -Robaxin 500 mg QID scheduled -Taper pain regimen as appropriate -Strongly encouraged incentive spirometry Difficulty urinating Patient reported this morning.  Did have 1 bladder scan which showed 300 mL postvoid residual. - Bladder scans q6h -I&O cath as indicated Hyperkalemia 5.4 on presentation; improved to 4.7 this AM. Patient has not required Lokelma. - AM BMP to trend Chronic health problem Hypothyroidism: Continue home Synthroid 75 mcg daily. HTN: Continue home lisinopril 20 mg daily HLD: Continue home atorvastatin 40 mg daily GERD: Continue home Pepcid 20 mg twice daily, magnesium oxide 400 mg twice daily   FEN/GI: Regular diet; MiraLAX scheduled BID, Colace BID, senna BID PPx: Lovenox Dispo:Home pending clinical improvement . Barriers include adequate pain control; further workup of  syncope.   Subjective:  Patient seen this morning sitting up in bed.  He reports he slept poorly overnight due to discomfort.  He has been using incentive spirometry.  No other concerns at this time.  Objective: Temp:  [97.9 F (36.6 C)-98.2 F (36.8 C)] 98 F (36.7 C) (03/31 0406) Pulse Rate:  [62-122] 87 (03/31 0406) Resp:  [18-28] 19 (03/31 0406) BP: (107-211)/(76-138) 112/78 (03/31 0406) SpO2:  [94 %-100 %] 94 % (03/31 0406) Physical Exam: General: Tired appearing, lying in bed comfortably, NAD. Cardiovascular: RRR, no murmurs Respiratory: CTA anteriorly, normal work of breathing Abdomen: Normoactive bowel sounds, soft, nontender except for the left abdominal border near ribs Extremities: No LE edema bilaterally  Laboratory: Most recent CBC Lab Results  Component Value Date   WBC 9.6 09/06/2023   HGB 11.8 (L) 09/06/2023   HCT 37.8 (L) 09/06/2023   MCV 75.4 (L) 09/06/2023   PLT 247 09/06/2023   Most recent BMP    Latest Ref Rng & Units 09/05/2023    7:41 PM  BMP  Glucose 70 - 99 mg/dL 147   BUN 8 - 23 mg/dL 27   Creatinine 8.29 - 1.24 mg/dL 5.62   Sodium 130 - 865 mmol/L 136   Potassium 3.5 - 5.1 mmol/L 4.6   Chloride 98 - 111 mmol/L 104   CO2 22 - 32 mmol/L 27   Calcium 8.9 - 10.3 mg/dL 8.7     Cyndia Skeeters, DO 09/06/2023, 7:51 AM  PGY-1, [facility] FPTS Intern pager: [phone], text pages welcome Secure chat group [facility] Teaching Service

## 2023-09-06 NOTE — Progress Notes (Addendum)
 [facility] Progress Note     Subjective: CC:  Ongoing L chest wall pain worse with movement and inspiration. Has not been OOB - states he cannot get up due to pain. Denies nausea or vomiting. Tolerating PO. Voiding without reported dysuria. Denies BM since admission. At baseline he lives alone in a third floor apartment complex. Has an elevator. His son is in [place].   Denies use of blood thinners. 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 Intake/Output this shift: No intake/output data recorded.  PE: Gen:  Alert, NAD, pleasant Card:  Regular rate and rhythm Pulm:  Normal effort ORA, clear to auscultation bilaterally - diminished at the bases, left lateral chest wall appropriately tender, no chest wall emphysema, no large hematoma over chest wall.  Abd: Soft, non-tender, non-distended, +BS Skin: warm and dry, no rashes  Psych: A&Ox3   Lab Results:  Recent Labs    09/05/23 1010  WBC 12.1*  HGB 14.3  HCT 46.5  PLT 262   BMET Recent Labs    09/05/23 1010 09/05/23 1941  NA 133* 136  K 5.4* 4.6  CL 99 104  CO2 25 27  GLUCOSE 140* 156*  BUN 28* 27*  CREATININE 2.01* 1.99*  CALCIUM 9.3 8.7*   PT/INR No results for input(s): "LABPROT", "INR" in the last 72 hours. CMP     Component Value Date/Time   NA 136 09/05/2023 1941   NA 140 07/19/2023 1016   K 4.6 09/05/2023 1941   CL 104 09/05/2023 1941   CO2 27 09/05/2023 1941   GLUCOSE 156 (H) 09/05/2023 1941   BUN 27 (H) 09/05/2023 1941   BUN 24 07/19/2023 1016   CREATININE 1.99  (H) 09/05/2023 1941   CALCIUM 8.7 (L) 09/05/2023 1941   PROT 7.8 09/05/2023 1010   PROT 7.9 07/19/2023 1016   ALBUMIN 3.5 09/05/2023 1010   ALBUMIN 4.0 07/19/2023 1016   AST 32 09/05/2023 1010   ALT 33 09/05/2023 1010   ALKPHOS 53 09/05/2023 1010   BILITOT 0.9 09/05/2023 1010   BILITOT 0.3 07/19/2023 1016   GFRNONAA 35 (L) 09/05/2023 1941   GFRAA 34 (L) 08/19/2015 1241   Lipase     Component Value Date/Time   LIPASE 64 (H) 02/01/2012 0603       Studies/Results: DG Ribs Unilateral W/Chest Left Result Date: 09/05/2023 CLINICAL DATA:  Syncope. Complains of left clavicle and left flank pain. Trauma to left chest. EXAM: LEFT RIBS AND CHEST - 3+ VIEW COMPARISON:  01/04/2021 and CT of the chest, also obtained today. FINDINGS: Upper limits of normal heart size. Atelectasis noted in both lung bases. No pleural effusion, interstitial edema or pneumothorax. Multiple left posterior and lateral rib fracture deformities are identified which involve the left fifth, sixth, seventh, tenth and eleventh ribs. Nondisplaced left seventh rib fracture is also present. IMPRESSION: 1. Multiple left posterior and lateral rib fracture deformities involving the left fifth, sixth, seventh, tenth and eleventh ribs. 2. Bibasilar atelectasis. Electronically Signed   By: Signa Kell M.D.   On: 09/05/2023 12:30  CT Chest Wo Contrast Result Date: 09/05/2023 CLINICAL DATA:  Chest trauma, blunt. EXAM: CT CHEST WITHOUT CONTRAST TECHNIQUE: Multidetector CT imaging of the chest was performed following the standard protocol without IV contrast. RADIATION DOSE REDUCTION: This exam was performed according to the departmental dose-optimization program which includes automated exposure control, adjustment of the mA and/or kV according to patient size and/or use of iterative reconstruction technique. COMPARISON:  01/05/2019 FINDINGS: Cardiovascular: Heart size is upper limits of normal. Trace pericardial effusion. Aortic  atherosclerosis and coronary artery calcifications. Mediastinum/Nodes: Thyroid gland appears surgically absent. Trachea appears patent and midline. Normal esophagus. No enlarged mediastinal or hilar lymph nodes. Lungs/Pleura: No pleural effusion. Bilateral lower lobe, lingular and right middle lobe subsegmental atelectasis with volume loss. No airspace consolidation or pneumothorax. Upper Abdomen: No acute abnormality. Musculoskeletal: There are numerous acute left rib fractures including: posterior aspect of the left fifth, sixth, seventh, tenth and eleventh ribs. Nondisplaced fracture noted involving the lateral aspect of the left seventh rib. IMPRESSION: 1. Numerous acute left rib fractures including: posterior aspect of the left fifth, sixth, seventh, tenth and eleventh ribs. Nondisplaced fracture noted involving the lateral aspect of the left seventh rib. 2. Bilateral lower lobe, lingular and right middle lobe subsegmental atelectasis with volume loss. 3. No pneumothorax. 4. Coronary artery calcifications. Electronically Signed   By: Signa Kell M.D.   On: 09/05/2023 12:06   CT Head Wo Contrast Result Date: 09/05/2023 CLINICAL DATA:  Head trauma, minor (Age >= 65y) EXAM: CT HEAD WITHOUT CONTRAST TECHNIQUE: Contiguous axial images were obtained from the base of the skull through the vertex without intravenous contrast. RADIATION DOSE REDUCTION: This exam was performed according to the departmental dose-optimization program which includes automated exposure control, adjustment of the mA and/or kV according to patient size and/or use of iterative reconstruction technique. COMPARISON:  01/05/2021 FINDINGS: Brain: No evidence of acute infarction, hemorrhage, hydrocephalus, extra-axial collection or mass lesion/mass effect. Patchy low-density changes within the periventricular and subcortical white matter most compatible with chronic microvascular ischemic change. Mild diffuse cerebral volume loss. Vascular:  Atherosclerotic calcifications involving the large vessels of the skull base. No unexpected hyperdense vessel. Skull: Normal. Negative for fracture or focal lesion. Sinuses/Orbits: No acute finding. Retention cysts in the right maxillary sinus. Other: Negative for scalp hematoma. IMPRESSION: 1. No acute intracranial findings. 2. Chronic microvascular ischemic change and cerebral volume loss. Electronically Signed   By: Duanne Guess D.O.   On: 09/05/2023 10:56    Anti-infectives: Anti-infectives (From admission, onward)    None        Assessment/Plan 84 y/p M s/p syncope and fall with L Rib FX 6-7. 10-11, no pneumothorax Afebrile, VSS, WBC 9.6 Adjust pain meds - increase tylenol to 1,000mg  QID, start robaxin 500 mg QID, lidoderm patch x2 to chest wall, not a candidate for NSAIDs given CKD, change oxy to 5-10 mg q 4h PRN. Would avoid scheduling narcotics as able. PRN IV dilaudid for breakthough. Patient needs to get OOB and mobilize. Continue IS.  PT/OT pending  Start colace BID, continue daily miralax    LOS: 1 day   I reviewed nursing notes, hospitalist notes, last 24 h vitals and pain scores, last 48 h intake and output, last 24 h labs and trends, and last 24 h imaging results.  This care required moderate level of medical decision making.   Hosie Spangle, PA-C [facility] Please see Amion for pager number during day hours 7:00am-4:30pm

## 2023-09-06 NOTE — Progress Notes (Signed)
Inpatient Rehab Admissions Coordinator:   Per therapy recommendations, patient was screened for CIR candidacy by Bethann Qualley, MS, CCC-SLP. At this time, Pt. does not appear to demonstrate medical necessity to justify in hospital rehabilitation/CIR. will not pursue a rehab consult for this Pt.   Recommend other rehab venues to be pursued.  Please contact me with any questions.  Kerrilyn Azbill, MS, CCC-SLP Rehab Admissions Coordinator  [phone] (celll) [phone] (office)   

## 2023-09-06 NOTE — Assessment & Plan Note (Addendum)
 5.4 on presentation; improved to 4.7 this AM. Patient has not required Lokelma. - AM BMP to trend

## 2023-09-06 NOTE — Assessment & Plan Note (Addendum)
 Cardiac workup thus far negative, however awaiting echo today for further evaluation. -Continuous cardiac monitoring x 24 hours -orthostatic vitals -PT/OT eval and treat -Fall precautions - AM CBC, BMP -Encourage p.o. intake - Strict I's and O's

## 2023-09-06 NOTE — Assessment & Plan Note (Signed)
 Patient reported this morning.  Did have 1 bladder scan which showed 300 mL postvoid residual. - Bladder scans q6h -I&O cath as indicated

## 2023-09-07 ENCOUNTER — Inpatient Hospital Stay (HOSPITAL_COMMUNITY)

## 2023-09-07 DIAGNOSIS — E860 Dehydration: Secondary | ICD-10-CM | POA: Diagnosis not present

## 2023-09-07 DIAGNOSIS — I5022 Chronic systolic (congestive) heart failure: Secondary | ICD-10-CM | POA: Diagnosis present

## 2023-09-07 DIAGNOSIS — N1832 Chronic kidney disease, stage 3b: Secondary | ICD-10-CM

## 2023-09-07 DIAGNOSIS — R55 Syncope and collapse: Secondary | ICD-10-CM | POA: Diagnosis not present

## 2023-09-07 DIAGNOSIS — S2242XA Multiple fractures of ribs, left side, initial encounter for closed fracture: Secondary | ICD-10-CM | POA: Diagnosis not present

## 2023-09-07 DIAGNOSIS — N179 Acute kidney failure, unspecified: Secondary | ICD-10-CM | POA: Diagnosis not present

## 2023-09-07 DIAGNOSIS — K567 Ileus, unspecified: Secondary | ICD-10-CM | POA: Insufficient documentation

## 2023-09-07 HISTORY — DX: Chronic kidney disease, stage 3b: N18.32

## 2023-09-07 LAB — CBC
HCT: 43 % (ref 39.0–52.0)
Hemoglobin: 13.4 g/dL (ref 13.0–17.0)
MCH: 24.1 pg — ABNORMAL LOW (ref 26.0–34.0)
MCHC: 31.2 g/dL (ref 30.0–36.0)
MCV: 77.5 fL — ABNORMAL LOW (ref 80.0–100.0)
Platelets: 217 10*3/uL (ref 150–400)
RBC: 5.55 MIL/uL (ref 4.22–5.81)
RDW: 15.7 % — ABNORMAL HIGH (ref 11.5–15.5)
WBC: 11.4 10*3/uL — ABNORMAL HIGH (ref 4.0–10.5)
nRBC: 0 % (ref 0.0–0.2)

## 2023-09-07 LAB — BASIC METABOLIC PANEL WITH GFR
Anion gap: 14 (ref 5–15)
BUN: 41 mg/dL — ABNORMAL HIGH (ref 8–23)
CO2: 22 mmol/L (ref 22–32)
Calcium: 9.3 mg/dL (ref 8.9–10.3)
Chloride: 102 mmol/L (ref 98–111)
Creatinine, Ser: 2.83 mg/dL — ABNORMAL HIGH (ref 0.61–1.24)
GFR, Estimated: 23 mL/min — ABNORMAL LOW (ref >60)
Glucose, Bld: 140 mg/dL — ABNORMAL HIGH (ref 70–99)
Potassium: 4.7 mmol/L (ref 3.5–5.1)
Sodium: 138 mmol/L (ref 135–145)

## 2023-09-07 LAB — MAGNESIUM: Magnesium: 2.3 mg/dL (ref 1.7–2.4)

## 2023-09-07 MED ORDER — SIMETHICONE 80 MG PO CHEW
80.0000 mg | CHEWABLE_TABLET | Freq: Four times a day (QID) | ORAL | Status: DC
  Administered 2023-09-07 – 2023-09-10 (×13): 80 mg via ORAL
  Filled 2023-09-07 (×13): qty 1

## 2023-09-07 MED ORDER — LACTATED RINGERS IV SOLN
INTRAVENOUS | Status: DC
  Administered 2023-09-07: 500 mL via INTRAVENOUS

## 2023-09-07 MED ORDER — ENOXAPARIN SODIUM 30 MG/0.3ML IJ SOSY: 30.0000 mg | PREFILLED_SYRINGE | INTRAMUSCULAR | Status: DC

## 2023-09-07 MED ORDER — LIDOCAINE 5 % EX PTCH: 2.0000 | MEDICATED_PATCH | CUTANEOUS | Status: DC

## 2023-09-07 MED ORDER — LIDOCAINE HCL URETHRAL/MUCOSAL 2 % EX GEL
1.0000 | Freq: Once | CUTANEOUS | Status: DC
  Filled 2023-09-07: qty 6

## 2023-09-07 MED ORDER — ACETAMINOPHEN 10 MG/ML IV SOLN
1000.0000 mg | Freq: Four times a day (QID) | INTRAVENOUS | Status: AC
  Administered 2023-09-07 – 2023-09-08 (×4): 1000 mg via INTRAVENOUS
  Filled 2023-09-07 (×4): qty 100

## 2023-09-07 MED ORDER — ENOXAPARIN SODIUM 30 MG/0.3ML IJ SOSY
30.0000 mg | PREFILLED_SYRINGE | Freq: Every day | INTRAMUSCULAR | Status: DC
  Administered 2023-09-07 – 2023-09-08 (×2): 30 mg via SUBCUTANEOUS
  Filled 2023-09-07 (×2): qty 0.3

## 2023-09-07 MED ORDER — ENOXAPARIN SODIUM 30 MG/0.3ML IJ SOSY
30.0000 mg | PREFILLED_SYRINGE | Freq: Two times a day (BID) | INTRAMUSCULAR | Status: DC
Start: 2023-09-07 — End: 2023-09-07

## 2023-09-07 MED ORDER — LIDOCAINE 5 % EX PTCH
2.0000 | MEDICATED_PATCH | Freq: Once | CUTANEOUS | Status: AC
  Administered 2023-09-07: 2 via TRANSDERMAL
  Filled 2023-09-07: qty 2

## 2023-09-07 MED ORDER — HYDROMORPHONE HCL 1 MG/ML IJ SOLN
0.2500 mg | INTRAMUSCULAR | Status: DC | PRN
  Administered 2023-09-07 – 2023-09-09 (×4): 0.25 mg via INTRAVENOUS
  Filled 2023-09-07 (×4): qty 0.5

## 2023-09-07 MED ORDER — LIDOCAINE 5 % EX PTCH
2.0000 | MEDICATED_PATCH | CUTANEOUS | Status: DC
  Administered 2023-09-08 – 2023-09-10 (×3): 2 via TRANSDERMAL
  Filled 2023-09-07 (×3): qty 2

## 2023-09-07 MED ORDER — POLYVINYL ALCOHOL 1.4 % OP SOLN
1.0000 [drp] | Freq: Every day | OPHTHALMIC | Status: DC
  Administered 2023-09-08 – 2023-09-10 (×3): 1 [drp] via OPHTHALMIC
  Filled 2023-09-07: qty 15

## 2023-09-07 MED ORDER — LACTATED RINGERS IV BOLUS
500.0000 mL | Freq: Once | INTRAVENOUS | Status: AC
  Administered 2023-09-07: 500 mL via INTRAVENOUS

## 2023-09-07 MED ORDER — FAMOTIDINE IN NACL 20-0.9 MG/50ML-% IV SOLN
20.0000 mg | Freq: Two times a day (BID) | INTRAVENOUS | Status: DC
  Administered 2023-09-07 – 2023-09-10 (×7): 20 mg via INTRAVENOUS
  Filled 2023-09-07 (×8): qty 50

## 2023-09-07 NOTE — Assessment & Plan Note (Addendum)
 Hypothyroidism: Continue home Synthroid 75 mcg daily  HTN: holding lisiopril in setting of suspected AKI HLD: continue home statin  GERD: continue home pepcid and mag oxide

## 2023-09-07 NOTE — Progress Notes (Cosign Needed Addendum)
 [name] Progress Note     Subjective: CC:  Cc is chest wall pain. Some abdominal distention overnight. Denies abdominal pain. Denies nausea/vomiting. Having small amt flatus, no BM.  States he is burping a little more than usual.  He is urinating and PVR was 150 mL per documentation. Got OOB yesterday. Feels muscle relaxer and oxy do help his pain.  Objective: Vital signs in last 24 hours: Temp:  [97.9 F (36.6 C)-98.6 F (37 C)] 98.4 F (36.9 C) (04/01 0729) Pulse Rate:  [91-104] 101 (04/01 0729) Resp:  [16-18] 16 (04/01 0729) BP: (126-147)/(78-94) 126/78 (04/01 0729) SpO2:  [90 %-97 %] 91 % (04/01 0729) Last BM Date : 09/05/23  Intake/Output from previous day: 03/31 0701 - 04/01 0700 In: 120 [P.O.:120] Out: 775 [Urine:775] Intake/Output this shift: No intake/output data recorded.  PE: Gen:  Alert, NAD, cooperative Card:  Regular rate and rhythm Pulm:  Normal effort ORA, clear to auscultation bilaterally - diminished at the bases, left lateral chest wall appropriately tender, no chest wall emphysema, no large hematoma over chest wall.  Abd: Soft, upper abdomen is distended and tympanic, abdomen is non-tender  Skin: warm and dry, no rashes  Psych: A&Ox3   Lab Results:  Recent Labs    09/05/23 1010 09/06/23 0606  WBC 12.1* 9.6  HGB 14.3 11.8*  HCT 46.5 37.8*  PLT 262 247   BMET Recent Labs    09/06/23 0606 09/07/23 0553  NA 137 138  K 4.7 4.7  CL 106 102  CO2 24 22  GLUCOSE 115* 140*  BUN 33* 41*  CREATININE 2.25* 2.83*  CALCIUM 8.6* 9.3   PT/INR No results for input(s): "LABPROT", "INR" in the last 72 hours. CMP     Component Value Date/Time   NA 138 09/07/2023 0553   NA 140 07/19/2023 1016   K 4.7 09/07/2023 0553   CL 102 09/07/2023 0553   CO2 22 09/07/2023 0553   GLUCOSE 140 (H) 09/07/2023 0553   BUN 41 (H) 09/07/2023 0553   BUN 24 07/19/2023 1016   CREATININE 2.83 (H) 09/07/2023 0553   CALCIUM 9.3 09/07/2023 0553   PROT 7.8  09/05/2023 1010   PROT 7.9 07/19/2023 1016   ALBUMIN 3.5 09/05/2023 1010   ALBUMIN 4.0 07/19/2023 1016   AST 32 09/05/2023 1010   ALT 33 09/05/2023 1010   ALKPHOS 53 09/05/2023 1010   BILITOT 0.9 09/05/2023 1010   BILITOT 0.3 07/19/2023 1016   GFRNONAA 23 (L) 09/07/2023 0553   GFRAA 34 (L) 08/19/2015 1241   Lipase     Component Value Date/Time   LIPASE 64 (H) 02/01/2012 0603       Studies/Results: DG Abd 1 View Result Date: 09/07/2023 CLINICAL DATA:  54098 with abdominal distension. EXAM: ABDOMEN - 1 VIEW COMPARISON:  Chest and upper abdomen CT without contrast 09/05/2023, abdomen series 01/30/2012. The FINDINGS: There is mild gas dilatation of the small intestine, maximum caliber 3.3 cm. There is nondilated gas distension in the large bowel to the junction of the descending with the sigmoid colon. Ileus is favored over mechanical obstruction in this case, but clinical correlation and radiographic follow-up recommended. There is no supine evidence of free air. Cholecystectomy clips again noted. There is cardiomegaly. Linear atelectasis or scarring both lung bases. There are degenerative changes of the lumbar spine and hips. No pathologic calcification is seen. IMPRESSION: 1. Mild gas dilatation of the small intestine with nondilated gas distension in the large bowel to the junction of  the descending with the sigmoid colon. Ileus is favored over mechanical obstruction in this case, but clinical correlation and radiographic follow-up recommended. 2. Cardiomegaly. Electronically Signed   By: Almira Bar M.D.   On: 09/07/2023 07:19   ECHOCARDIOGRAM COMPLETE Result Date: 09/06/2023    ECHOCARDIOGRAM REPORT   Patient Name:   [name] Date of Exam: 09/06/2023 Medical Rec #:  [id number]       Height:       70.0 in Accession #:    [id number]      Weight:       168.4 lb Date of Birth:  1951/03/25       BSA:          1.940 m Patient Age:    73 years        BP:           112/78 mmHg Patient  Gender: M               HR:           89 bpm. Exam Location:  Inpatient Procedure: 2D Echo, 3D Echo, Cardiac Doppler, Color Doppler and Strain Analysis            (Both Spectral and Color Flow Doppler were utilized during            procedure). Indications:    Syncope  History:        Patient has prior history of Echocardiogram examinations, most                 recent 01/05/2021. Risk Factors:Dyslipidemia and Current Smoker.  Sonographer:    Karma Ganja Referring Phys: [id number] TODD D MCDIARMID  Sonographer Comments: Global longitudinal strain was attempted. IMPRESSIONS  1. Left ventricular ejection fraction, by estimation, is 45 to 50%. Left ventricular ejection fraction by 3D volume is 39 %. The left ventricle has mildly decreased function. The left ventricle has no regional wall motion abnormalities. Left ventricular  diastolic parameters are consistent with Grade I diastolic dysfunction (impaired relaxation). The average left ventricular global longitudinal strain is -11.5 %. The global longitudinal strain is abnormal.  2. Right ventricular systolic function is normal. The right ventricular size is normal. Tricuspid regurgitation signal is inadequate for assessing PA pressure.  3. The mitral valve is normal in structure. No evidence of mitral valve regurgitation. No evidence of mitral stenosis.  4. The aortic valve is grossly normal. Aortic valve regurgitation is not visualized. No aortic stenosis is present. Comparison(s): Prior images reviewed side by side. The left ventricular function is worsened. FINDINGS  Left Ventricle: Left ventricular ejection fraction, by estimation, is 45 to 50%. Left ventricular ejection fraction by 3D volume is 39 %. The left ventricle has mildly decreased function. The left ventricle has no regional wall motion abnormalities. The  average left ventricular global longitudinal strain is -11.5 %. Strain was performed and the global longitudinal strain is abnormal. 3D ejection fraction  reviewed and evaluated as part of the interpretation. Alternate measurement of EF is felt to be most reflective of LV function. The left ventricular internal cavity size was normal in size. There is no left ventricular hypertrophy. Left ventricular diastolic parameters are consistent with Grade I diastolic dysfunction (impaired relaxation). Normal left ventricular filling pressure. Right Ventricle: The right ventricular size is normal. Right vetricular wall thickness was not well visualized. Right ventricular systolic function is normal. Tricuspid regurgitation signal is inadequate for assessing PA pressure. Left Atrium: Left atrial size was normal  in size. Right Atrium: Right atrial size was normal in size. Prominent Eustachian valve. Pericardium: There is no evidence of pericardial effusion. Mitral Valve: The mitral valve is normal in structure. No evidence of mitral valve regurgitation. No evidence of mitral valve stenosis. Tricuspid Valve: The tricuspid valve is normal in structure. Tricuspid valve regurgitation is not demonstrated. Aortic Valve: The aortic valve is grossly normal. Aortic valve regurgitation is not visualized. No aortic stenosis is present. Aortic valve mean gradient measures 2.0 mmHg. Aortic valve peak gradient measures 3.4 mmHg. Aortic valve area, by VTI measures 2.35 cm. Pulmonic Valve: The pulmonic valve was not well visualized. Aorta: The aortic root is normal in size and structure. IAS/Shunts: No atrial level shunt detected by color flow Doppler. Additional Comments: 3D was performed not requiring image post processing on an independent workstation and was abnormal.  LEFT VENTRICLE PLAX 2D LVIDd:         4.80 cm         Diastology LVIDs:         3.70 cm         LV e' medial:    6.74 cm/s LV PW:         1.00 cm         LV E/e' medial:  8.4 LV IVS:        1.10 cm         LV e' lateral:   9.25 cm/s LVOT diam:     2.00 cm         LV E/e' lateral: 6.1 LV SV:         36 LV SV Index:   19               2D Longitudinal LVOT Area:     3.14 cm        Strain                                2D Strain GLS   -11.5 %                                Avg: LV Volumes (MOD) LV vol d, MOD    45.3 ml       3D Volume EF A2C:                           LV 3D EF:    Left LV vol d, MOD    70.5 ml                    ventricul A4C:                                        ar LV vol s, MOD    27.7 ml                    ejection A2C:                                        fraction LV vol s, MOD    37.4 ml  by 3D A4C:                                        volume is LV SV MOD A2C:   17.6 ml                    39 %. LV SV MOD A4C:   70.5 ml LV SV MOD BP:    24.9 ml                                3D Volume EF:                                3D EF:        39 %                                LV EDV:       85 ml                                LV ESV:       51 ml                                LV SV:        33 ml RIGHT VENTRICLE RV Basal diam:  3.60 cm RV S prime:     15.10 cm/s TAPSE (M-mode): 2.4 cm LEFT ATRIUM             Index        RIGHT ATRIUM           Index LA diam:        3.00 cm 1.55 cm/m   RA Area:     10.20 cm LA Vol (A2C):   30.5 ml 15.72 ml/m  RA Volume:   18.30 ml  9.43 ml/m LA Vol (A4C):   31.1 ml 16.03 ml/m LA Biplane Vol: 32.4 ml 16.70 ml/m  AORTIC VALVE AV Area (Vmax):    2.41 cm AV Area (Vmean):   2.24 cm AV Area (VTI):     2.35 cm AV Vmax:           92.80 cm/s AV Vmean:          64.200 cm/s AV VTI:            0.155 m AV Peak Grad:      3.4 mmHg AV Mean Grad:      2.0 mmHg LVOT Vmax:         71.20 cm/s LVOT Vmean:        45.800 cm/s LVOT VTI:          0.116 m LVOT/AV VTI ratio: 0.75  AORTA Ao Root diam: 3.40 cm MITRAL VALVE MV Area (PHT): 5.13 cm     SHUNTS MV Decel Time: 148 msec     Systemic VTI:  0.12 m MV E velocity: 56.30 cm/s   Systemic Diam: 2.00 cm MV A velocity: 120.00 cm/s MV E/A ratio:  0.47 Mihai Croitoru MD Electronically signed by Thurmon Fair MD Signature Date/Time:  09/06/2023/5:16:58  PM    Final    DG Ribs Unilateral W/Chest Left Result Date: 09/05/2023 CLINICAL DATA:  Syncope. Complains of left clavicle and left flank pain. Trauma to left chest. EXAM: LEFT RIBS AND CHEST - 3+ VIEW COMPARISON:  01/04/2021 and CT of the chest, also obtained today. FINDINGS: Upper limits of normal heart size. Atelectasis noted in both lung bases. No pleural effusion, interstitial edema or pneumothorax. Multiple left posterior and lateral rib fracture deformities are identified which involve the left fifth, sixth, seventh, tenth and eleventh ribs. Nondisplaced left seventh rib fracture is also present. IMPRESSION: 1. Multiple left posterior and lateral rib fracture deformities involving the left fifth, sixth, seventh, tenth and eleventh ribs. 2. Bibasilar atelectasis. Electronically Signed   By: Signa Kell M.D.   On: 09/05/2023 12:30   CT Chest Wo Contrast Result Date: 09/05/2023 CLINICAL DATA:  Chest trauma, blunt. EXAM: CT CHEST WITHOUT CONTRAST TECHNIQUE: Multidetector CT imaging of the chest was performed following the standard protocol without IV contrast. RADIATION DOSE REDUCTION: This exam was performed according to the departmental dose-optimization program which includes automated exposure control, adjustment of the mA and/or kV according to patient size and/or use of iterative reconstruction technique. COMPARISON:  01/05/2019 FINDINGS: Cardiovascular: Heart size is upper limits of normal. Trace pericardial effusion. Aortic atherosclerosis and coronary artery calcifications. Mediastinum/Nodes: Thyroid gland appears surgically absent. Trachea appears patent and midline. Normal esophagus. No enlarged mediastinal or hilar lymph nodes. Lungs/Pleura: No pleural effusion. Bilateral lower lobe, lingular and right middle lobe subsegmental atelectasis with volume loss. No airspace consolidation or pneumothorax. Upper Abdomen: No acute abnormality. Musculoskeletal: There are numerous  acute left rib fractures including: posterior aspect of the left fifth, sixth, seventh, tenth and eleventh ribs. Nondisplaced fracture noted involving the lateral aspect of the left seventh rib. IMPRESSION: 1. Numerous acute left rib fractures including: posterior aspect of the left fifth, sixth, seventh, tenth and eleventh ribs. Nondisplaced fracture noted involving the lateral aspect of the left seventh rib. 2. Bilateral lower lobe, lingular and right middle lobe subsegmental atelectasis with volume loss. 3. No pneumothorax. 4. Coronary artery calcifications. Electronically Signed   By: Signa Kell M.D.   On: 09/05/2023 12:06   CT Head Wo Contrast Result Date: 09/05/2023 CLINICAL DATA:  Head trauma, minor (Age >= 65y) EXAM: CT HEAD WITHOUT CONTRAST TECHNIQUE: Contiguous axial images were obtained from the base of the skull through the vertex without intravenous contrast. RADIATION DOSE REDUCTION: This exam was performed according to the departmental dose-optimization program which includes automated exposure control, adjustment of the mA and/or kV according to patient size and/or use of iterative reconstruction technique. COMPARISON:  01/05/2021 FINDINGS: Brain: No evidence of acute infarction, hemorrhage, hydrocephalus, extra-axial collection or mass lesion/mass effect. Patchy low-density changes within the periventricular and subcortical white matter most compatible with chronic microvascular ischemic change. Mild diffuse cerebral volume loss. Vascular: Atherosclerotic calcifications involving the large vessels of the skull base. No unexpected hyperdense vessel. Skull: Normal. Negative for fracture or focal lesion. Sinuses/Orbits: No acute finding. Retention cysts in the right maxillary sinus. Other: Negative for scalp hematoma. IMPRESSION: 1. No acute intracranial findings. 2. Chronic microvascular ischemic change and cerebral volume loss. Electronically Signed   By: Duanne Guess D.O.   On: 09/05/2023  10:56    Anti-infectives: Anti-infectives (From admission, onward)    None        Assessment/Plan 28 y/p M s/p syncope and fall with L Rib FX 6-7. 10-11, no pneumothorax Afebrile, VSS, WBC 9.6 yesterday  Multimodal pain control: tylenol to 1,000mg  QID, robaxin 500 mg QID (could increase this if needed), lidoderm patch x2 to chest wall, not a candidate for NSAIDs given CKD, oxy to 5-10 mg q 4h PRN. Would avoid scheduling narcotics as able. PRN IV dilaudid for breakthough. Patient needs to get OOB and mobilize. Continue IS.  PT/OT Noted echo done yesterday w/ EF 45-50%, Gr 1 diastolic dysfunction.  Adynamic ileus - patient clinically has an ileus. He is hemodynamically stable and his abdomen is non-tender.  On admission he had only CTH and CT chest performed. On the chest CT the stomach is mildly distended and the liver/spleen appear to be without traumatic injury. CBC is pending. If patient has significant drop in hgb or if he develops worsening abdominal symptoms will need CT abdomen. Low suspicion for missed intra-abdominal injury at present so I do not feel CT abdomen is warranted this morning. Continue bowel regimen and add simethicone. Back off diet to clears. Will be difficult to limit narcotics in the setting of multiple rib fractures.   FEN:CLD, IVF per primary team ID: none indicated VTE: SCD's, Lovenox BID per trauma protocol  Foley: none, spont voids Dispo: med-surg, PT/OT, supportive care for ileus   CKD HTN HLD PMH CVA      LOS: 2 days   I reviewed nursing notes, hospitalist notes, last 24 h vitals and pain scores, last 48 h intake and output, last 24 h labs and trends, and last 24 h imaging results.  This care required moderate level of medical decision making.   Guy Spangle, PA-C [name] Please see Amion for pager number during day hours 7:00am-4:30pm

## 2023-09-07 NOTE — Assessment & Plan Note (Deleted)
 Patient reported this morning.  Did have 1 bladder scan which showed 300 mL postvoid residual. - Bladder scans q6h -I&O cath as indicated

## 2023-09-07 NOTE — Assessment & Plan Note (Addendum)
 Concern of AKI d/t increase in Cr overnight. Unclear etiology, though suspect decreased PO intake. - encourage PO intake, will consider IV fluids if poor PO intake - hold lisinopril - AM BMP

## 2023-09-07 NOTE — Assessment & Plan Note (Addendum)
 Echo shows decrease in EF, but no concern to aortic insufficiency given patient remained stable without acute events, will discontinue telemetry.  - D/C telemetry  -Fall precautions - AM CBC, BMP -Encourage p.o. intake - Strict I's and O's

## 2023-09-07 NOTE — TOC Progression Note (Addendum)
 Transition of Care [facility]) - Progression Note    Patient Details  Name: [name] MRN: [id number] Date of Birth: Apr 03, 1951  Transition [facility] Care [facility]) CM/SW Contact  Cayden Rautio A [place], LCSW Phone Number: 09/07/2023, 11:20 AM  Clinical Narrative:     Update 1552 CSW met with pt at bedside to provide bed offers with Medicare.gov ratings. He stated they would discuss and make a decision on a facility and let CSW know. CSW contact information provided.   TOC will continue to follow.     CSW met with pt at bedside, discuss update that pt is not a candidate for CIR. Pt stated that he was ok with SNF at discharge. He stated that if he is able to go home with home health, that would be preferred. SNF workup completed. Bed offers pending.   TOC will continue to follow.     Expected Discharge Plan: Skilled Nursing Facility Barriers to Discharge: Continued Medical Work up, SNF Pending bed offer, Insurance Authorization  Expected Discharge Plan and Services       Living arrangements for the past 2 months: Apartment                                       Social Determinants of Health (SDOH) Interventions SDOH Screenings   Food Insecurity: No Food Insecurity (09/05/2023)  Housing: Low Risk  (09/05/2023)  Transportation Needs: No Transportation Needs (09/05/2023)  Utilities: Not At Risk (09/05/2023)  Depression (PHQ2-9): Medium Risk (07/14/2023)  Social Connections: Unknown (09/05/2023)  Tobacco Use: Medium Risk (09/05/2023)    Readmission Risk Interventions    09/07/2023   11:19 AM  Readmission Risk Prevention Plan  Post Dischage Appt Complete  Medication Screening Complete  Transportation Screening Complete

## 2023-09-07 NOTE — Plan of Care (Signed)
 FMTS Brief Progress Note  S: Paged by RN that patient's abdomen felt tight, and postvoid residual on bladder scan was 150 mL.  Evaluated patient at bedside.  He states that his abdomen does feel more distended than when he came in.  His last bowel movement was 3/30.  States that he is still urinating and has never had issues urinating.  He reports significant pain in his left ribs.   O: BP (!) 147/83 (BP Location: Right Arm)   Pulse (!) 104   Temp 98.6 F (37 C) (Oral)   Resp 18   SpO2 91%   GEN: No acute distress but appears uncomfortable Abdomen: Hypoactive bowel sounds, distended, nontender  A/P: Abdominal Distention This is new since when I saw him on admission.  I suspect this is due to constipation. - Obtain KUB, consider CT abdomen if abnormal - Continue scheduled MiraLAX twice daily and senna twice daily - Day team to see  Para March, DO 09/07/2023, 6:26 AM PGY-1, [facility] Family [facility] Night Resident  Please page [phone] with questions.

## 2023-09-07 NOTE — Progress Notes (Signed)
     Daily Progress Note Intern Pager: [phone]  Patient name: [name] Medical record number: [id number] Date of birth: 1951/05/17 Age: 73 y.o. Gender: male  Primary Care Provider: Para March, DO Consultants: Trauma surgery Code Status: DNR/DNI  Pt Overview and Major Events to Date:  2/30: Admitted  Assessment and Plan: Patient is a 73 year old male with past medical history of hypertension hyperlipidemia admitted for rib fractures after syncopal event.  Assessment & Plan Syncope Echo shows decrease in EF, but no concern to aortic insufficiency given patient remained stable without acute events, will discontinue telemetry.  - D/C telemetry  -Fall precautions - AM CBC, BMP -Encourage p.o. intake - Strict I's and O's Rib fractures - Pain regimen: Tylenol 1000 mg 4 times daily scheduled, oxycodone 5 to 10 mg every 4 as needed, Dilaudid 0.5 mg every 6 as needed for breakthrough pain, lidocaine patch, will taper as admitted  - Will d/c Robaxin due to anticholinergic effects - continue PT/OT  Chronic kidney disease, stage 3b (HCC) Concern of AKI d/t increase in Cr overnight. Unclear etiology, though suspect decreased PO intake. - encourage PO intake, will consider IV fluids if poor PO intake - hold lisinopril - AM BMP  Ileus (HCC) KUB and exam consistent with ileus, likely in setting of infection and opoid use. - Clear liquid diet - Consider IV fluids if poor intake - watch I/Os - continue bowel regimen of miralax, senna, colace  - AM BMP, Mag, Phos   Chronic health problem Hypothyroidism: Continue home Synthroid 75 mcg daily  HTN: holding lisiopril in setting of suspected AKI HLD: continue home statin  GERD: continue home pepcid and mag oxide   FEN/GI: Clear liquid diet  PPx: lovenox  Dispo: CIR vs SNF  Subjective:  Patient reports no acute distress.  Still with some pain in ribs  Objective: Temp:  [97.9 F (36.6 C)-98.6 F (37 C)] 98.4 F (36.9 C)  (04/01 0729) Pulse Rate:  [91-104] 101 (04/01 0729) Resp:  [16-18] 16 (04/01 0729) BP: (126-147)/(78-94) 126/78 (04/01 0729) SpO2:  [90 %-97 %] 91 % (04/01 0729) Physical Exam: General: NAD, in bed comfortably  Cardiovascular: RRR, no m/r/g Respiratory: CTAB, NWOB on RA Abdomen: distended, decreased bowel sounds, NT Extremities: no LE edema noted   Laboratory: Most recent CBC Lab Results  Component Value Date   WBC 9.6 09/06/2023   HGB 11.8 (L) 09/06/2023   HCT 37.8 (L) 09/06/2023   MCV 75.4 (L) 09/06/2023   PLT 247 09/06/2023   Most recent BMP    Latest Ref Rng & Units 09/07/2023    5:53 AM  BMP  Glucose 70 - 99 mg/dL 696   BUN 8 - 23 mg/dL 41   Creatinine 2.95 - 1.24 mg/dL 2.84   Sodium 132 - 440 mmol/L 138   Potassium 3.5 - 5.1 mmol/L 4.7   Chloride 98 - 111 mmol/L 102   CO2 22 - 32 mmol/L 22   Calcium 8.9 - 10.3 mg/dL 9.3     Imaging/Diagnostic Tests: KUB: 1. Mild gas dilatation of the small intestine with nondilated gas distension in the large bowel to the junction of the descending with the sigmoid colon. Ileus is favored over mechanical obstruction in this case, but clinical correlation and radiographic follow-up recommended.  Georg Ruddle Bernhard Koskinen, MD 09/07/2023, 8:05 AM  PGY-1, [facility] FPTS Intern pager: [phone], text pages welcome Secure chat group [facility] Teaching Service

## 2023-09-07 NOTE — Assessment & Plan Note (Signed)
 KUB and exam consistent with ileus, likely in setting of infection and opoid use. - Clear liquid diet - Consider IV fluids if poor intake - watch I/Os - continue bowel regimen of miralax, senna, colace  - AM BMP, Mag, Phos

## 2023-09-07 NOTE — Progress Notes (Addendum)
 HR 107-110 bpm.  Patient reports a tiny BM and a small amount of flatus. On exam he has worsening abdominal distention and tympany. He is belching and has slightly labored respirations due to his distention. His main complaint remains left chest wall tenderness.  Recommend NG tube placement. This was ordered and discussed with RN staff face to face. Primary team, family medicine resident, made aware so maintenance fluids/med adjustments could be made due to NPO status.    Hosie Spangle, PA-C [facility] Please see Amion for pager number during day hours 7:00am-4:30pm

## 2023-09-07 NOTE — Assessment & Plan Note (Addendum)
-   Pain regimen: Tylenol 1000 mg 4 times daily scheduled, oxycodone 5 to 10 mg every 4 as needed, Dilaudid 0.5 mg every 6 as needed for breakthrough pain, lidocaine patch, will taper as admitted  - Will d/c Robaxin due to anticholinergic effects - continue PT/OT

## 2023-09-07 NOTE — Progress Notes (Signed)
 Patient refused gastric tube insertion. Made the provider aware. Will continue to monitor

## 2023-09-07 NOTE — [place] FL2 (Signed)
 [place] MEDICAID FL2 LEVEL OF CARE FORM     IDENTIFICATION  Patient Name: [name] Birthdate: September 30, 1950 Sex: male Admission Date (Current Location): 09/05/2023  [place] and [place] Number:  [name] [id number] Facility and Address:  [facility] [place], [address], [place], [place] [address]      Provider Number: [id number]  Attending Physician Name and Address:  McDiarmid, Leighton Roach, MD  Relative Name and Phone Number:  [name] (Sister)  [phone]    Current Level of Care: Hospital Recommended Level of Care: Skilled Nursing Facility Prior Approval Number:    Date Approved/Denied:   PASRR Number: [id number]  Discharge Plan: SNF    Current Diagnoses: Patient Active Problem List   Diagnosis Date Noted   Chronic health problem 09/05/2023   Rib fractures 09/05/2023   Encounter for medical examination to establish care 07/14/2023   Hypothyroidism 01/05/2021   Portal vein thrombosis 01/05/2021   Syncope 01/04/2021   Chronic kidney disease, stage 3b (HCC) 01/29/2012   Hypertension 01/29/2012   Pancreatitis    Hyperlipidemia    Colon polyps     Orientation RESPIRATION BLADDER Height & Weight     Self, Time, Situation, Place  Normal Continent Weight:   Height:     BEHAVIORAL SYMPTOMS/MOOD NEUROLOGICAL BOWEL NUTRITION STATUS      Continent Diet (see DC summary)  AMBULATORY STATUS COMMUNICATION OF NEEDS Skin   Limited Assist Verbally Normal                       Personal Care Assistance Level of Assistance  Bathing, Feeding, Dressing Bathing Assistance: Limited assistance Feeding assistance: Limited assistance Dressing Assistance: Limited assistance     Functional Limitations Info  Sight, Hearing, Speech Sight Info: Impaired (wears glasses) Hearing Info: Adequate Speech Info: Adequate    SPECIAL CARE FACTORS FREQUENCY  PT (By licensed PT), OT (By licensed OT)     PT Frequency: 5x/week OT Frequency: 5x/week             Contractures Contractures Info: Not present    Additional Factors Info  Code Status, Allergies Code Status Info: DNR Allergies Info: Aspirin  Aspirin-acetaminophen-caffeine  Chlorthalidone           Current Medications (09/07/2023):  This is the current hospital active medication list Current Facility-Administered Medications  Medication Dose Route Frequency Provider Last Rate Last Admin   acetaminophen (TYLENOL) tablet 1,000 mg  1,000 mg Oral Q6H Simaan, Elizabeth S, PA-C   1,000 mg at 09/07/23 1013   atorvastatin (LIPITOR) tablet 40 mg  40 mg Oral Daily Cyndia Skeeters, DO   40 mg at 09/07/23 1013   cholecalciferol (VITAMIN D3) 25 MCG (1000 UNIT) tablet 1,000 Units  1,000 Units Oral Daily Cyndia Skeeters, DO   1,000 Units at 09/07/23 1014   docusate sodium (COLACE) capsule 100 mg  100 mg Oral BID Adam Phenix, PA-C   100 mg at 09/07/23 1013   enoxaparin (LOVENOX) injection 30 mg  30 mg Subcutaneous Daily Simaan, Elizabeth S, PA-C       famotidine (PEPCID) tablet 20 mg  20 mg Oral BID Spence, Sarah, DO   20 mg at 09/07/23 1016   HYDROmorphone (DILAUDID) injection 0.5 mg  0.5 mg Intravenous Q6H PRN McDiarmid, Leighton Roach, MD   0.5 mg at 09/07/23 0535   levothyroxine (SYNTHROID) tablet 75 mcg  75 mcg Oral Q0600 Cyndia Skeeters, DO   75 mcg at 09/07/23 0546   lidocaine (LIDODERM)  5 % 2 patch  2 patch Transdermal Once Baloch, Mahnoor, MD   2 patch at 09/07/23 1036   [START ON 09/08/2023] lidocaine (LIDODERM) 5 % 2 patch  2 patch Transdermal Q24H Hammons, Kimberly B, RPH       magnesium oxide (MAG-OX) tablet 400 mg  400 mg Oral BID Cyndia Skeeters, DO   400 mg at 09/07/23 1012   oxyCODONE (Oxy IR/ROXICODONE) immediate release tablet 5-10 mg  5-10 mg Oral Q4H PRN Celine Mans, MD   10 mg at 09/06/23 2153   polyethylene glycol (MIRALAX / GLYCOLAX) packet 17 g  17 g Oral BID Shitarev, Dimitry, MD   17 g at 09/07/23 1014   [START ON 09/08/2023] polyvinyl alcohol (LIQUIFILM TEARS) 1.4 %  ophthalmic solution 1 drop  1 drop Both Eyes Daily Baloch, Mahnoor, MD       senna (SENOKOT) tablet 8.6 mg  1 tablet Oral BID Shitarev, Dimitry, MD   8.6 mg at 09/07/23 1013   simethicone (MYLICON) chewable tablet 80 mg  80 mg Oral QID Adam Phenix, PA-C   80 mg at 09/07/23 1013     Discharge Medications: Please see discharge summary for a list of discharge medications.  Relevant Imaging Results:  Relevant Lab Results:   Additional Information [id number]  Guy Junkin A [place], LCSW

## 2023-09-07 NOTE — Assessment & Plan Note (Deleted)
 5.4 on presentation; improved to 4.7 this AM. Patient has not required [place]. - AM BMP to trend

## 2023-09-07 NOTE — Progress Notes (Addendum)
 PT Cancellation Note  Patient Details Name: [name] MRN: [id number] DOB: [birth year]   Cancelled Treatment:    Reason Eval/Treat Not Completed: Fatigue/lethargy limiting ability to participate;Pain limiting ability to participate. Pt declines PT intervention at this time, reports recently returning to bed after sitting up for multiple hours. PT will follow up tomorrow.  Per chart review pt does not demonstrate medical necessity for CIR at [facility]. PT updates recommendations to short term inpatient PT services at an outside venue. PT will continue to adjust recommendations based on patient progression during acute hospitalization.     Arlyss Gandy 09/07/2023, 5:35 PM

## 2023-09-07 NOTE — Progress Notes (Signed)
 Patient continues to refuse NG tube placement. Patient educated on how the tube is placed and also suggested if MD approved could give some medication to relax the patient. Patient continued to refuse. On Call Provider notified. Will continue to monitor this shift.

## 2023-09-07 NOTE — Care Plan (Addendum)
 FMTS Interim Progress Note  Evaluated patients telemetry, which was concerning for new ST elevations. Obtained EKG which showed sinus tachycardia. Patient c/o of left rib pain which remains unchanged from prior but denies any chest tightness or chest pain. Tachycardia likely 2/2 to poor PO intake. Patient in NAD on exam, does not appear toxic.  - 500 cc LR bolus - CCM x 12 hours - consider troponins if worsening or changed chest pain   Addendum 14:40 pm: Discussed tachycardia with surgery team. Patient appears more distended on exam. Concur with NPO status, NG tube placement and starting mIVF. Will continue to monitor VS.   Addendum 17:39 pm Nurse informed team of yellow MEWS with BP 151/100, MAP 115, HR 115. In to assess patient who reports he is in significant pain at his left ribs. Otherwise appears stable without acute distress. Abdomen appears distended, though not TTP.Marland Kitchen Discussed NG tube placement with patient who is still feeling anxious about it and would like to think about for the night.

## 2023-09-08 ENCOUNTER — Inpatient Hospital Stay (HOSPITAL_COMMUNITY)

## 2023-09-08 DIAGNOSIS — R55 Syncope and collapse: Secondary | ICD-10-CM | POA: Diagnosis not present

## 2023-09-08 DIAGNOSIS — S2242XA Multiple fractures of ribs, left side, initial encounter for closed fracture: Secondary | ICD-10-CM | POA: Diagnosis not present

## 2023-09-08 LAB — PHOSPHORUS: Phosphorus: 3.6 mg/dL (ref 2.5–4.6)

## 2023-09-08 LAB — BASIC METABOLIC PANEL WITH GFR
Anion gap: 12 (ref 5–15)
BUN: 38 mg/dL — ABNORMAL HIGH (ref 8–23)
CO2: 21 mmol/L — ABNORMAL LOW (ref 22–32)
Calcium: 8.6 mg/dL — ABNORMAL LOW (ref 8.9–10.3)
Chloride: 106 mmol/L (ref 98–111)
Creatinine, Ser: 2.45 mg/dL — ABNORMAL HIGH (ref 0.61–1.24)
GFR, Estimated: 27 mL/min — ABNORMAL LOW (ref >60)
Glucose, Bld: 122 mg/dL — ABNORMAL HIGH (ref 70–99)
Potassium: 4.6 mmol/L (ref 3.5–5.1)
Sodium: 139 mmol/L (ref 135–145)

## 2023-09-08 LAB — MAGNESIUM: Magnesium: 2.1 mg/dL (ref 1.7–2.4)

## 2023-09-08 LAB — GLUCOSE, CAPILLARY: Glucose-Capillary: 119 mg/dL — ABNORMAL HIGH (ref 70–99)

## 2023-09-08 MED ORDER — LACTATED RINGERS IV SOLN: INTRAVENOUS | Status: AC

## 2023-09-08 NOTE — Assessment & Plan Note (Addendum)
 Patient still not in pain, though abdomen does still appear distended. Patient is having some bowel function with small BM and flatus. Will continue IV meds given ongoing distention.  - continue NPO - continue IVF - repeat KUB - continue bowel regimen with miralax and senna - will continue to monitor electrolytes

## 2023-09-08 NOTE — Progress Notes (Signed)
 On Call MD notified regarding patients elevated BP and HR. Patient given pain medication, BP decreased a little bit. No new orders at this time, Assigned RN instructed to call back if BP begins to increase again. Patients bladder scanned per orders, result 0. On call MD notified Assigned RN that they would come to see the patient regarding refusal of NG tube. Will continue to monitor this shift.

## 2023-09-08 NOTE — Assessment & Plan Note (Addendum)
 Cardiac workup continues to be negative.  Telemetry without abnormalities.  Patient denies any more syncopal episodes.  Orthostatics negative.  Syncopal event likely in the setting of vasovagal reaction. May consider prn beta blocker if continues to experience tachycardia  -Fall precautions - Encourage OOB

## 2023-09-08 NOTE — Progress Notes (Addendum)
     Daily Progress Note Intern Pager: [phone]  Patient name: [name] Medical record number: [id number] Date of birth: Sep 08, 1950 Age: 73 y.o. Gender: male  Primary Care Provider: Para March, DO Consultants: Trauma Surgery Code Status: DNR/DNI  Pt Overview and Major Events to Date:  3/30: admitted   Assessment and Plan: Patient is a 73 year old male with past medical history of hypertension, hyperlipidemia admitted for rib fractures after syncopal event.   Assessment & Plan Syncope Cardiac workup continues to be negative.  Telemetry without abnormalities.  Patient denies any more syncopal episodes.  Orthostatics negative.  Syncopal event likely in the setting of vasovagal reaction. May consider prn beta blocker if continues to experience tachycardia  -Fall precautions - Encourage OOB Rib fractures Pain regimen: restarting PO regimen with Tylenol 1000mg  q6h, oxycodone 5-10mg  q4h prn with dilaudid 0.5 mg q6h prn for brekthrough. Continue lidocaine patch  pain - Encourage OOB, PT/OT following  Chronic kidney disease, stage 3b (HCC) Baseline appears to be around ~2. AKI improving from 2.83 > 2.45.  - continue IVF with 100 mL/hr LR  - continue to hold lisinopril  - AM BMP  Ileus (HCC) Patient still not in pain, though abdomen does still appear distended. Patient is having some bowel function with small BM and flatus. Will continue IV meds given ongoing distention.  - continue NPO - continue IVF - repeat KUB - continue bowel regimen with miralax and senna - will continue to monitor electrolytes   Chronic health problem Hypothyroidism: continue home synthroid  HTN: continue to hold home lisinopril  HLD: continue statin  GERD: continue home pepcid    FEN/GI: NPO PPx: lovenox Dispo: SNF vs HH  Subjective:  Reports mild pain in ribs but did improve from prior. Reports small BM earlier in the AM. No abdominal pain. Denies chest pain  Objective: Temp:  [97.6 F  (36.4 C)-98.9 F (37.2 C)] 97.8 F (36.6 C) (04/02 0735) Pulse Rate:  [105-133] 105 (04/02 0735) Resp:  [16-18] 16 (04/02 0735) BP: (123-224)/(89-132) 145/92 (04/02 0735) SpO2:  [93 %-98 %] 98 % (04/02 0735) Physical Exam: General: Elderly male, NAD Cardiovascular: tachycardia, no m/r/g Respiratory: CTAB, NWOB on RA Abdomen: soft but distended, not TTP Extremities: no LE edema noted   Laboratory: Most recent CBC Lab Results  Component Value Date   WBC 11.4 (H) 09/07/2023   HGB 13.4 09/07/2023   HCT 43.0 09/07/2023   MCV 77.5 (L) 09/07/2023   PLT 217 09/07/2023   Most recent BMP    Latest Ref Rng & Units 09/08/2023    6:01 AM  BMP  Glucose 70 - 99 mg/dL 147   BUN 8 - 23 mg/dL 38   Creatinine 8.29 - 1.24 mg/dL 5.62   Sodium 130 - 865 mmol/L 139   Potassium 3.5 - 5.1 mmol/L 4.6   Chloride 98 - 111 mmol/L 106   CO2 22 - 32 mmol/L 21   Calcium 8.9 - 10.3 mg/dL 8.6     Imaging/Diagnostic Tests: No new imaging  Penne Lash, MD 09/08/2023, 7:52 AM  PGY-1, [facility] FPTS Intern pager: [phone], text pages welcome Secure chat group [facility] Teaching Service

## 2023-09-08 NOTE — Progress Notes (Signed)
 On Call Resident MD's at the bedside. Assigned RN not present for their conversation with the patient. Assigned RN went to the patients room because bed alarm was going off, Assigned RN noted patient on the bedside commode. On Call Residents explained to Assigned RN verbally that patient continues to refuse NG tube, and also mentioned that "if patients blood pressure goes up then give pain medication and if its before pain medication is due, let us know." Will continue to monitor this shift.

## 2023-09-08 NOTE — Plan of Care (Signed)
 FMTS Brief Progress Note  S: Went to patient bedside to evaluate after report of intermittent cough per nursing.  Patient sitting up in bed in no distress.  Patient states he has had a nonproductive cough since yesterday afternoon.  States it is intermittent and feels like something is "coming out but nothing does."  States he has been using his incentive spirometer regularly.  States pain is well-controlled, though it worsens with coughing and moving. Denies fevers, chills, muscle aches, reflux.    O: BP (!) 159/99 (BP Location: Right Arm)   Pulse (!) 110   Temp 98.4 F (36.9 C) (Oral)   Resp 16   SpO2 100%    Physical exam Gen: Sitting up in bed, pleasant and conversant, in no acute distress CV: Regular rate and rhythm, normal S1/S2 Pulm: Clear to auscultation bilaterally, no crackles or wheezes.  Normal work of breathing.  Occasional dry cough.  A/P: Intermittent cough -Differential includes atelectasis secondary to rib fractures/pain, pulmonary edema secondary to fluid overload, GERD, respiratory infection.  Most likely due to atelectasis given ongoing pain and no signs of pulmonary congestion on exam, no report of GERD symptoms, no signs of pulmonary infection. -Encouraged continued use of incentive spirometer -Will continue to monitor and adjust pain regimen, fluids as needed   Lorayne Bender, MD 09/08/2023, 5:12 PM PGY-1, [facility] Night Resident  Please page [phone] with questions.

## 2023-09-08 NOTE — Plan of Care (Signed)
 FMTS Interim Progress Note  Patient assessed at bedside with Dr. Threasa Beards.  Ongoing hypertension and tachycardia likely secondary to pain as it improves after he receives IV Dilaudid.  Discussed with patient his abdominal distention is likely secondary to ileus and possibly regarding pressure on his lungs and worsening his rib pain.  Patient open to NG tube but would like to discuss it further with his family tomorrow.  Reports he has been having gas.  Concluded the conversation with transfer to the bedside commode as he felt he would have a BM.  Elberta Fortis, MD 09/08/2023, 1:34 AM PGY-2, [facility] Family Medicine Service pager [phone]

## 2023-09-08 NOTE — Plan of Care (Signed)
  Problem: Education: Goal: Knowledge of General Education information will improve Description: Including pain rating scale, medication(s)/side effects and non-pharmacologic comfort measures Outcome: Progressing   Problem: Health Behavior/Discharge Planning: Goal: Ability to manage health-related needs will improve Outcome: Progressing   Problem: Clinical Measurements: Goal: Will remain free from infection Outcome: Progressing Goal: Respiratory complications will improve Outcome: Progressing Goal: Cardiovascular complication will be avoided Outcome: Progressing   Problem: Activity: Goal: Risk for activity intolerance will decrease Outcome: Progressing   Problem: Coping: Goal: Level of anxiety will decrease Outcome: Progressing   Problem: Elimination: Goal: Will not experience complications related to urinary retention Outcome: Progressing   Problem: Pain Managment: Goal: General experience of comfort will improve and/or be controlled Outcome: Progressing   Problem: Safety: Goal: Ability to remain free from injury will improve Outcome: Progressing   Problem: Skin Integrity: Goal: Risk for impaired skin integrity will decrease Outcome: Progressing

## 2023-09-08 NOTE — Progress Notes (Addendum)
 [facility] Progress Note     Subjective: CC:  Up in the chair - states he feels a little better. Reports one small and one normal, non-bloody BM last 12 hours. Denies nausea or vomiting.   Creatinine 2.45 from 2.83 after IVF  Objective: Vital signs in last 24 hours: Temp:  [97.6 F (36.4 C)-98.9 F (37.2 C)] 97.8 F (36.6 C) (04/02 0735) Pulse Rate:  [105-133] 105 (04/02 0735) Resp:  [16-18] 16 (04/02 0735) BP: (123-224)/(89-132) 145/92 (04/02 0735) SpO2:  [93 %-98 %] 98 % (04/02 0735) Last BM Date : 09/07/23  Intake/Output from previous day: 04/01 0701 - 04/02 0700 In: 40 [P.O.:40] Out: 525 [Urine:525] Intake/Output this shift: No intake/output data recorded.  PE: Gen:  Alert, NAD, cooperative Card:  Regular rate and rhythm Pulm:  Normal effort ORA, clear to auscultation bilaterally, eft lateral chest wall appropriately tender, no chest wall emphysema, no large hematoma over chest wall.  Abd: Soft, abdomen remains distended and tympanic, no tenderness, no guarding. Skin: warm and dry, no rashes  Psych: A&Ox3   Lab Results:  Recent Labs    09/06/23 0606 09/07/23 0841  WBC 9.6 11.4*  HGB 11.8* 13.4  HCT 37.8* 43.0  PLT 247 217   BMET Recent Labs    09/06/23 0606 09/07/23 0553  NA 137 138  K 4.7 4.7  CL 106 102  CO2 24 22  GLUCOSE 115* 140*  BUN 33* 41*  CREATININE 2.25* 2.83*  CALCIUM 8.6* 9.3   PT/INR No results for input(s): "LABPROT", "INR" in the last 72 hours. CMP     Component Value Date/Time   NA 138 09/07/2023 0553   NA 140 07/19/2023 1016   K 4.7 09/07/2023 0553   CL 102 09/07/2023 0553   CO2 22 09/07/2023 0553   GLUCOSE 140 (H) 09/07/2023 0553   BUN 41 (H) 09/07/2023 0553   BUN 24 07/19/2023 1016   CREATININE 2.83 (H) 09/07/2023 0553   CALCIUM 9.3 09/07/2023 0553   PROT 7.8 09/05/2023 1010   PROT 7.9 07/19/2023 1016   ALBUMIN 3.5 09/05/2023 1010   ALBUMIN 4.0 07/19/2023 1016   AST 32 09/05/2023 1010   ALT 33  09/05/2023 1010   ALKPHOS 53 09/05/2023 1010   BILITOT 0.9 09/05/2023 1010   BILITOT 0.3 07/19/2023 1016   GFRNONAA 23 (L) 09/07/2023 0553   GFRAA 34 (L) 08/19/2015 1241   Lipase     Component Value Date/Time   LIPASE 64 (H) 02/01/2012 0603       Studies/Results: DG Abd 1 View Result Date: 09/07/2023 CLINICAL DATA:  21308 with abdominal distension. EXAM: ABDOMEN - 1 VIEW COMPARISON:  Chest and upper abdomen CT without contrast 09/05/2023, abdomen series 01/30/2012. The FINDINGS: There is mild gas dilatation of the small intestine, maximum caliber 3.3 cm. There is nondilated gas distension in the large bowel to the junction of the descending with the sigmoid colon. Ileus is favored over mechanical obstruction in this case, but clinical correlation and radiographic follow-up recommended. There is no supine evidence of free air. Cholecystectomy clips again noted. There is cardiomegaly. Linear atelectasis or scarring both lung bases. There are degenerative changes of the lumbar spine and hips. No pathologic calcification is seen. IMPRESSION: 1. Mild gas dilatation of the small intestine with nondilated gas distension in the large bowel to the junction of the descending with the sigmoid colon. Ileus is favored over mechanical obstruction in this case, but clinical correlation and radiographic follow-up recommended. 2. Cardiomegaly. Electronically  Signed   By: Almira Bar M.D.   On: 09/07/2023 07:19   ECHOCARDIOGRAM COMPLETE Result Date: 09/06/2023    ECHOCARDIOGRAM REPORT   Patient Name:   DEBORAH DONDERO Date of Exam: 09/06/2023 Medical Rec #:  [id number]       Height:       70.0 in Accession #:    [id number]      Weight:       168.4 lb Date of Birth:  [birth year]       BSA:          1.940 m Patient Age:    73 years        BP:           112/78 mmHg Patient Gender: M               HR:           89 bpm. Exam Location:  Inpatient Procedure: 2D Echo, 3D Echo, Cardiac Doppler, Color Doppler and Strain  Analysis            (Both Spectral and Color Flow Doppler were utilized during            procedure). Indications:    Syncope  History:        Patient has prior history of Echocardiogram examinations, most                 recent 01/05/2021. Risk Factors:Dyslipidemia and Current Smoker.  Sonographer:    Karma Ganja Referring Phys: [id number] TODD D MCDIARMID  Sonographer Comments: Global longitudinal strain was attempted. IMPRESSIONS  1. Left ventricular ejection fraction, by estimation, is 45 to 50%. Left ventricular ejection fraction by 3D volume is 39 %. The left ventricle has mildly decreased function. The left ventricle has no regional wall motion abnormalities. Left ventricular  diastolic parameters are consistent with Grade I diastolic dysfunction (impaired relaxation). The average left ventricular global longitudinal strain is -11.5 %. The global longitudinal strain is abnormal.  2. Right ventricular systolic function is normal. The right ventricular size is normal. Tricuspid regurgitation signal is inadequate for assessing PA pressure.  3. The mitral valve is normal in structure. No evidence of mitral valve regurgitation. No evidence of mitral stenosis.  4. The aortic valve is grossly normal. Aortic valve regurgitation is not visualized. No aortic stenosis is present. Comparison(s): Prior images reviewed side by side. The left ventricular function is worsened. FINDINGS  Left Ventricle: Left ventricular ejection fraction, by estimation, is 45 to 50%. Left ventricular ejection fraction by 3D volume is 39 %. The left ventricle has mildly decreased function. The left ventricle has no regional wall motion abnormalities. The  average left ventricular global longitudinal strain is -11.5 %. Strain was performed and the global longitudinal strain is abnormal. 3D ejection fraction reviewed and evaluated as part of the interpretation. Alternate measurement of EF is felt to be most reflective of LV function. The left  ventricular internal cavity size was normal in size. There is no left ventricular hypertrophy. Left ventricular diastolic parameters are consistent with Grade I diastolic dysfunction (impaired relaxation). Normal left ventricular filling pressure. Right Ventricle: The right ventricular size is normal. Right vetricular wall thickness was not well visualized. Right ventricular systolic function is normal. Tricuspid regurgitation signal is inadequate for assessing PA pressure. Left Atrium: Left atrial size was normal in size. Right Atrium: Right atrial size was normal in size. Prominent Eustachian valve. Pericardium: There is no evidence of pericardial effusion. Mitral Valve: The  mitral valve is normal in structure. No evidence of mitral valve regurgitation. No evidence of mitral valve stenosis. Tricuspid Valve: The tricuspid valve is normal in structure. Tricuspid valve regurgitation is not demonstrated. Aortic Valve: The aortic valve is grossly normal. Aortic valve regurgitation is not visualized. No aortic stenosis is present. Aortic valve mean gradient measures 2.0 mmHg. Aortic valve peak gradient measures 3.4 mmHg. Aortic valve area, by VTI measures 2.35 cm. Pulmonic Valve: The pulmonic valve was not well visualized. Aorta: The aortic root is normal in size and structure. IAS/Shunts: No atrial level shunt detected by color flow Doppler. Additional Comments: 3D was performed not requiring image post processing on an independent workstation and was abnormal.  LEFT VENTRICLE PLAX 2D LVIDd:         4.80 cm         Diastology LVIDs:         3.70 cm         LV e' medial:    6.74 cm/s LV PW:         1.00 cm         LV E/e' medial:  8.4 LV IVS:        1.10 cm         LV e' lateral:   9.25 cm/s LVOT diam:     2.00 cm         LV E/e' lateral: 6.1 LV SV:         36 LV SV Index:   19              2D Longitudinal LVOT Area:     3.14 cm        Strain                                2D Strain GLS   -11.5 %                                 Avg: LV Volumes (MOD) LV vol d, MOD    45.3 ml       3D Volume EF A2C:                           LV 3D EF:    Left LV vol d, MOD    70.5 ml                    ventricul A4C:                                        ar LV vol s, MOD    27.7 ml                    ejection A2C:                                        fraction LV vol s, MOD    37.4 ml                    by 3D A4C:  volume is LV SV MOD A2C:   17.6 ml                    39 %. LV SV MOD A4C:   70.5 ml LV SV MOD BP:    24.9 ml                                3D Volume EF:                                3D EF:        39 %                                LV EDV:       85 ml                                LV ESV:       51 ml                                LV SV:        33 ml RIGHT VENTRICLE RV Basal diam:  3.60 cm RV S prime:     15.10 cm/s TAPSE (M-mode): 2.4 cm LEFT ATRIUM             Index        RIGHT ATRIUM           Index LA diam:        3.00 cm 1.55 cm/m   RA Area:     10.20 cm LA Vol (A2C):   30.5 ml 15.72 ml/m  RA Volume:   18.30 ml  9.43 ml/m LA Vol (A4C):   31.1 ml 16.03 ml/m LA Biplane Vol: 32.4 ml 16.70 ml/m  AORTIC VALVE AV Area (Vmax):    2.41 cm AV Area (Vmean):   2.24 cm AV Area (VTI):     2.35 cm AV Vmax:           92.80 cm/s AV Vmean:          64.200 cm/s AV VTI:            0.155 m AV Peak Grad:      3.4 mmHg AV Mean Grad:      2.0 mmHg LVOT Vmax:         71.20 cm/s LVOT Vmean:        45.800 cm/s LVOT VTI:          0.116 m LVOT/AV VTI ratio: 0.75  AORTA Ao Root diam: 3.40 cm MITRAL VALVE MV Area (PHT): 5.13 cm     SHUNTS MV Decel Time: 148 msec     Systemic VTI:  0.12 m MV E velocity: 56.30 cm/s   Systemic Diam: 2.00 cm MV A velocity: 120.00 cm/s MV E/A ratio:  0.47 Mihai Croitoru MD Electronically signed by Thurmon Fair MD Signature Date/Time: 09/06/2023/5:16:58 PM    Final     Anti-infectives: Anti-infectives (From admission, onward)    None        Assessment/Plan 80 y/p M  s/p syncope and fall with L Rib FX 6-7. 10-11, no pneumothorax Afebrile,  VSS, WBC 9.6 yesterday Multimodal pain control: tylenol to 1,000mg  QID, primary team discontinued all PO analgesics. While I appreciate the negative effect these agents have on intestinal motility, I think pain control for ribs will not be adequate without these medications, placing him at risk for pneumonia. Adequate pain control might also improve his hypertension. If his ileus continues to improve I recommend resumption of a muscle relaxer 3-4 times daily as well as tramadol 50 mg q 6h or oxycodone 5mg  q 4h PRN. OOB and mobilize. Continue IS.  PT/OT  Adynamic ileus - patient clinically has an ileus. He is hemodynamically stable and his abdomen is non-tender.  On admission he had only CTH and CT chest performed. On the chest CT the stomach is mildly distended and the liver/spleen appear to be without traumatic injury. Hgb is stable, Low suspicion for missed intra-abdominal injury at present so I do not feel CT abdomen is warranted.  Having some bowel function. Abdomen remains tight/distended, no abdominal pain. No vomiting. Will order to KUB to re-examine bowel gas pattern. Continue NPO until further bowel function and until distention improves. Sips with meds ok.   FEN:NPO, IVF per primary team ID: none indicated VTE: SCD's, Lovenox daily - renally adjusted per pharmacy due to CKD Foley: none, spont voids Dispo: med-surg, PT/OT, supportive care for ileus   CKD HTN HLD PMH CVA      LOS: 3 days   I reviewed nursing notes, hospitalist notes, last 24 h vitals and pain scores, last 48 h intake and output, last 24 h labs and trends, and last 24 h imaging results.  This care required moderate level of medical decision making.   Hosie Spangle, PA-C [facility] Please see Amion for pager number during day hours 7:00am-4:30pm

## 2023-09-08 NOTE — Care Management Important Message (Signed)
 Important Message  Patient Details  Name: [name] MRN: [id number] Date of Birth: 1950/12/31   Important Message Given:  Yes - Medicare IM     Dorena Bodo 09/08/2023, 3:01 PM

## 2023-09-08 NOTE — Assessment & Plan Note (Signed)
 Baseline appears to be around ~2. AKI improving from 2.83 > 2.45.  - continue IVF with 100 mL/hr LR  - continue to hold lisinopril  - AM BMP

## 2023-09-08 NOTE — Progress Notes (Signed)
 Messaged on call Resident MD to inform of small amount of BM, and asked for something for BP. Will await instruction.

## 2023-09-08 NOTE — Plan of Care (Signed)

## 2023-09-08 NOTE — Assessment & Plan Note (Signed)
 Hypothyroidism: continue home synthroid  HTN: continue to hold home lisinopril  HLD: continue statin  GERD: continue home pepcid

## 2023-09-08 NOTE — Progress Notes (Signed)
 Occupational Therapy Treatment Patient Details Name: [name] MRN: [id number] DOB: [birth year] Today's Date: 09/08/2023   History of present illness 73 y.o. male presenting 09/05/23 with syncope and fall. Left rib fxs 5-7, 10-11; CT head no acute changes; troponins negative; PMH-CKD, HLD, HTN, prediabetes, Stroke   OT comments  Pt making good progress with functional goals despite pain. Pt states that he can tell a difference with functional performance. Pt stood at sink for grooming, hygiene and oral care tasks with CGA, BSC transfers CGA/sup, toileting tasks, UB ADLs standing at sink min A. Pt educated on ADL A/E with handout provided. OT will continue to follow acutely to maximize level of function and safety      If plan is discharge home, recommend the following:  A little help with walking and/or transfers;Assistance with cooking/housework;A lot of help with bathing/dressing/bathroom;Assist for transportation;Help with stairs or ramp for entrance   Equipment Recommendations  Other (comment);Toilet riser (ADL A/E kit)    Recommendations for Other Services      Precautions / Restrictions Precautions Precautions: Fall;Other (comment) Recall of Precautions/Restrictions: Intact Precaution/Restrictions Comments: syncope, painful L rib fx areas Restrictions Weight Bearing Restrictions Per Provider Order: No       Mobility Bed Mobility               General bed mobility comments: pt in recliner    Transfers Overall transfer level: Needs assistance   Transfers: Sit to/from Stand Sit to Stand: Supervision                 Balance Overall balance assessment: Needs assistance Sitting-balance support: No upper extremity supported, Feet supported Sitting balance-Leahy Scale: Fair Sitting balance - Comments: due to rib pain   Standing balance support: Bilateral upper extremity supported, During functional activity, Reliant on assistive device for balance Standing  balance-Leahy Scale: Fair Standing balance comment: no c/o dizziness. Pt stood at sink for grooming/hygiene tasks with RW                           ADL either performed or assessed with clinical judgement   ADL Overall ADL's : Needs assistance/impaired     Grooming: Wash/dry hands;Wash/dry face;Oral care;Contact guard assist;Standing   Upper Body Bathing: Minimal assistance;Standing Upper Body Bathing Details (indicate cue type and reason): due to L rib fx pain, simulated     Upper Body Dressing : Contact guard assist;Standing       Toilet Transfer: Contact guard assist;Supervision/safety;Ambulation;Rolling walker (2 wheels);BSC/3in1   Toileting- Clothing Manipulation and Hygiene: Minimal assistance;Sit to/from stand       Functional mobility during ADLs: Contact guard assist;Supervision/safety;Rolling walker (2 wheels) General ADL Comments: pt edcuated on ADL A/E with handout provided    Extremity/Trunk Assessment Upper Extremity Assessment Upper Extremity Assessment: Overall WFL for tasks assessed   Lower Extremity Assessment Lower Extremity Assessment: Defer to PT evaluation   Cervical / Trunk Assessment Cervical / Trunk Assessment: Other exceptions Cervical / Trunk Exceptions: broken left ribs    Vision Ability to See in Adequate Light: 0 Adequate Patient Visual Report: No change from baseline     Perception     Praxis     Communication Communication Communication: No apparent difficulties   Cognition Arousal: Alert Behavior During Therapy: WFL for tasks assessed/performed Cognition: No apparent impairments  Following commands: Intact        Cueing      Exercises      Shoulder Instructions       General Comments max HR 125 bpm    Pertinent Vitals/ Pain       Pain Assessment Pain Assessment: 0-10 Pain Score: 7  Pain Location: L ribs with movement Pain Descriptors / Indicators:  Discomfort, Guarding, Grimacing Pain Intervention(s): Monitored during session, Repositioned  Home Living                                          Prior Functioning/Environment              Frequency  Min 2X/week        Progress Toward Goals  OT Goals(current goals can now be found in the care plan section)  Progress towards OT goals: Progressing toward goals     Plan      Co-evaluation                 AM-PAC OT "6 Clicks" Daily Activity     Outcome Measure   Help from another person eating meals?: None Help from another person taking care of personal grooming?: A Little Help from another person toileting, which includes using toliet, bedpan, or urinal?: A Little Help from another person bathing (including washing, rinsing, drying)?: A Lot Help from another person to put on and taking off regular upper body clothing?: A Little Help from another person to put on and taking off regular lower body clothing?: A Lot 6 Click Score: 17    End of Session Equipment Utilized During Treatment: Gait belt;Other (comment);Rolling walker (2 wheels) (BSC)  OT Visit Diagnosis: Other abnormalities of gait and mobility (R26.89);History of falling (Z91.81);Pain Pain - Right/Left: Left Pain - part of body:  (ribs)   Activity Tolerance Patient limited by pain   Patient Left in chair;with call bell/phone within reach   Nurse Communication          Time: 0981-1914 OT Time Calculation (min): 25 min  Charges: OT General Charges $OT Visit: 1 Visit OT Treatments $Self Care/Home Management : 8-22 mins $Therapeutic Activity: 8-22 mins    Galen Manila 09/08/2023, 1:54 PM

## 2023-09-08 NOTE — Assessment & Plan Note (Addendum)
 Pain regimen: restarting PO regimen with Tylenol 1000mg  q6h, oxycodone 5-10mg  q4h prn with dilaudid 0.5 mg q6h prn for brekthrough. Continue lidocaine patch  pain - Encourage OOB, PT/OT following

## 2023-09-08 NOTE — Discharge Instructions (Addendum)
 Dear [name],   Thank you so much for allowing [place] to be part of your care!  You were admitted to [facility] after passing out. You were found to have a few rib fractures and were given pain medications to help. You were also found to have a slow gut. You were given pain medication to help with the rib fracture.    POST-HOSPITAL & CARE INSTRUCTIONS Please let PCP/Specialists know of any changes that were made.  Please see medications section of this packet for any medication changes.   DOCTOR'S APPOINTMENT & FOLLOW UP CARE INSTRUCTIONS  Future Appointments  Date Time Provider Department Center  09/23/2023 10:30 AM [facility] PREVISIT [facility]  10/14/2023 10:30 AM Armbruster, Willaim Rayas, MD [facility]  10/26/2023 10:15 AM McDonald, Rachelle Hora, DPM [facility]    RETURN PRECAUTIONS: - Unable to keep fluids down - Severe belly pain - Bloody or black stools  Take care and be well!  Family Medicine Teaching Service  [place]  [facility]  [address] [place], [place] [address] [phone]

## 2023-09-08 NOTE — Progress Notes (Signed)
 Physical Therapy Treatment Patient Details Name: [name] MRN: [id number] DOB: 30-Oct-1950 Today's Date: 09/08/2023   History of Present Illness 73 y.o. male presenting 09/05/23 with syncope and fall. Left rib fxs 5-7, 10-11; CT head no acute changes; troponins negative; PMH-CKD, HLD, HTN, prediabetes, Stroke    PT Comments  Pt seen for PT tx with pt requiring MAX encouragement, education re: participation & ongoing mobility. Pt is limited by pain but reports he received pain meds prior to PT arrival. Pt transferred STS with extra time & supervision, ambulates into hallway with RW & CGA. Anticipate pt will progress well once pain is more controlled.    If plan is discharge home, recommend the following: Assistance with cooking/housework;Assist for transportation;A little help with walking and/or transfers;A little help with bathing/dressing/bathroom   Can travel by private vehicle     Yes  Equipment Recommendations  Rolling walker (2 wheels)    Recommendations for Other Services       Precautions / Restrictions Precautions Precautions: Fall;Other (comment) Recall of Precautions/Restrictions: Intact Precaution/Restrictions Comments: syncope, painful L rib fx areas Restrictions Weight Bearing Restrictions Per Provider Order: No     Mobility  Bed Mobility               General bed mobility comments: not tested, pt received & left in recliner    Transfers Overall transfer level: Needs assistance Equipment used: Rolling walker (2 wheels) Transfers: Sit to/from Stand Sit to Stand: Supervision           General transfer comment: STS from recliner with significantly extra time to transfer 2/2 increased L rib pain    Ambulation/Gait Ambulation/Gait assistance: Contact guard assist Gait Distance (Feet): 75 Feet Assistive device: Rolling walker (2 wheels) Gait Pattern/deviations: Decreased step length - right, Decreased step length - left, Decreased stride length,  Narrow base of support Gait velocity: significantly decreased     General Gait Details: Pt ambulates into hallway with RW & CGA without LOB but ongoing c/o L rib pain with mobility   Stairs             Wheelchair Mobility     Tilt Bed    Modified Rankin (Stroke Patients Only)       Balance Overall balance assessment: Needs assistance Sitting-balance support: No upper extremity supported, Feet supported Sitting balance-Leahy Scale: Fair     Standing balance support: Bilateral upper extremity supported, During functional activity, Reliant on assistive device for balance Standing balance-Leahy Scale: Fair                              Hotel manager: No apparent difficulties  Cognition Arousal: Alert Behavior During Therapy: WFL for tasks assessed/performed                             Following commands: Intact      Cueing Cueing Techniques: Verbal cues  Exercises      General Comments General comments (skin integrity, edema, etc.): max HR 125 bpm      Pertinent Vitals/Pain Pain Assessment Pain Assessment: Faces Faces Pain Scale: Hurts whole lot Pain Location: L ribs with movement Pain Descriptors / Indicators: Discomfort, Guarding, Grimacing Pain Intervention(s): Monitored during session    Home Living  Prior Function            PT Goals (current goals can now be found in the care plan section) Acute Rehab PT Goals Patient Stated Goal: decr pain PT Goal Formulation: With patient Time For Goal Achievement: 09/20/23 Potential to Achieve Goals: Good Progress towards PT goals: Progressing toward goals    Frequency    Min 3X/week      PT Plan      Co-evaluation              AM-PAC PT "6 Clicks" Mobility   Outcome Measure  Help needed turning from your back to your side while in a flat bed without using bedrails?: A Little Help needed moving  from lying on your back to sitting on the side of a flat bed without using bedrails?: A Lot Help needed moving to and from a bed to a chair (including a wheelchair)?: A Little Help needed standing up from a chair using your arms (e.g., wheelchair or bedside chair)?: A Little Help needed to walk in hospital room?: A Little Help needed climbing 3-5 steps with a railing? : A Little 6 Click Score: 17    End of Session   Activity Tolerance: Patient limited by pain Patient left: in chair;with call bell/phone within reach   PT Visit Diagnosis: Other abnormalities of gait and mobility (R26.89);Pain Pain - Right/Left: Left Pain - part of body:  (ribs)     Time: 1610-9604 PT Time Calculation (min) (ACUTE ONLY): 10 min  Charges:    $Therapeutic Activity: 8-22 mins PT General Charges $$ ACUTE PT VISIT: 1 Visit                     Aleda Grana, PT, DPT 09/08/23, 1:33 PM   Sandi Mariscal 09/08/2023, 1:32 PM

## 2023-09-09 ENCOUNTER — Telehealth: Payer: Self-pay

## 2023-09-09 DIAGNOSIS — K567 Ileus, unspecified: Secondary | ICD-10-CM | POA: Diagnosis not present

## 2023-09-09 DIAGNOSIS — R55 Syncope and collapse: Secondary | ICD-10-CM | POA: Diagnosis not present

## 2023-09-09 LAB — GLUCOSE, CAPILLARY
Glucose-Capillary: 87 mg/dL (ref 70–99)
Glucose-Capillary: 79 mg/dL (ref 70–99)
Glucose-Capillary: 97 mg/dL (ref 70–99)

## 2023-09-09 LAB — CBC
HCT: 35.3 % — ABNORMAL LOW (ref 39.0–52.0)
Hemoglobin: 11.3 g/dL — ABNORMAL LOW (ref 13.0–17.0)
MCH: 24.2 pg — ABNORMAL LOW (ref 26.0–34.0)
MCHC: 32 g/dL (ref 30.0–36.0)
MCV: 75.6 fL — ABNORMAL LOW (ref 80.0–100.0)
Platelets: 204 10*3/uL (ref 150–400)
RBC: 4.67 MIL/uL (ref 4.22–5.81)
RDW: 15.4 % (ref 11.5–15.5)
WBC: 10.7 10*3/uL — ABNORMAL HIGH (ref 4.0–10.5)
nRBC: 0 % (ref 0.0–0.2)

## 2023-09-09 LAB — BASIC METABOLIC PANEL WITH GFR
Anion gap: 11 (ref 5–15)
BUN: 28 mg/dL — ABNORMAL HIGH (ref 8–23)
CO2: 22 mmol/L (ref 22–32)
Calcium: 8.9 mg/dL (ref 8.9–10.3)
Chloride: 104 mmol/L (ref 98–111)
Creatinine, Ser: 2.05 mg/dL — ABNORMAL HIGH (ref 0.61–1.24)
GFR, Estimated: 34 mL/min — ABNORMAL LOW (ref >60)
Glucose, Bld: 99 mg/dL (ref 70–99)
Potassium: 4.7 mmol/L (ref 3.5–5.1)
Sodium: 137 mmol/L (ref 135–145)

## 2023-09-09 LAB — PHOSPHORUS: Phosphorus: 3.1 mg/dL (ref 2.5–4.6)

## 2023-09-09 LAB — MAGNESIUM: Magnesium: 2.2 mg/dL (ref 1.7–2.4)

## 2023-09-09 MED ORDER — CARVEDILOL 6.25 MG PO TABS
6.2500 mg | ORAL_TABLET | Freq: Two times a day (BID) | ORAL | Status: DC
  Administered 2023-09-09 – 2023-09-10 (×3): 6.25 mg via ORAL
  Filled 2023-09-09 (×3): qty 1

## 2023-09-09 MED ORDER — TIZANIDINE HCL 4 MG PO TABS: 2.0000 mg | ORAL_TABLET | Freq: Three times a day (TID) | ORAL | Status: DC

## 2023-09-09 MED ORDER — TIZANIDINE HCL 4 MG PO TABS: 2.0000 mg | ORAL_TABLET | Freq: Every day | ORAL | Status: DC

## 2023-09-09 MED ORDER — ACETAMINOPHEN 500 MG PO TABS
1000.0000 mg | ORAL_TABLET | Freq: Four times a day (QID) | ORAL | Status: DC
  Administered 2023-09-09 – 2023-09-10 (×6): 1000 mg via ORAL
  Filled 2023-09-09 (×6): qty 2

## 2023-09-09 MED ORDER — HYDROMORPHONE HCL 1 MG/ML IJ SOLN: 0.2500 mg | INTRAMUSCULAR | Status: DC | PRN

## 2023-09-09 MED ORDER — OXYCODONE HCL 5 MG PO TABS
5.0000 mg | ORAL_TABLET | ORAL | Status: DC | PRN
  Administered 2023-09-09: 10 mg via ORAL
  Filled 2023-09-09: qty 2

## 2023-09-09 MED ORDER — ENOXAPARIN SODIUM 40 MG/0.4ML IJ SOSY
40.0000 mg | PREFILLED_SYRINGE | Freq: Every day | INTRAMUSCULAR | Status: DC
  Administered 2023-09-09 – 2023-09-10 (×2): 40 mg via SUBCUTANEOUS
  Filled 2023-09-09 (×2): qty 0.4

## 2023-09-09 MED ORDER — TIZANIDINE HCL 4 MG PO TABS
2.0000 mg | ORAL_TABLET | Freq: Two times a day (BID) | ORAL | Status: DC
  Administered 2023-09-09 – 2023-09-10 (×3): 2 mg via ORAL
  Filled 2023-09-09 (×3): qty 1

## 2023-09-09 MED ORDER — HYDROMORPHONE HCL 1 MG/ML IJ SOLN: 0.2500 mg | Freq: Four times a day (QID) | INTRAMUSCULAR | Status: DC | PRN

## 2023-09-09 NOTE — Telephone Encounter (Signed)
 Dx of inpatient admission is multiple broken ribs and it appears the patient is being referred for rehab Thanks Bre

## 2023-09-09 NOTE — Progress Notes (Signed)
    Durable Medical Equipment  (From admission, onward)           Start     Ordered   09/09/23 1641  For home use only DME Bedside commode  Once       Comments: 3:1 needed  Question:  Patient needs a bedside commode to treat with the following condition  Answer:  Syncope   09/09/23 1640   09/09/23 1639  For home use only DME Walker rolling  Once       Question Answer Comment  Walker: With 5 Inch Wheels   Patient needs a walker to treat with the following condition Syncope      09/09/23 1639

## 2023-09-09 NOTE — TOC Progression Note (Signed)
 Transition of Care [facility]) - Progression Note    Patient Details  Name: [name] MRN: [id number] Date of Birth: 11-28-1950  Transition of Care [facility]) CM/SW Contact  Amaryllis Malmquist A [place], LCSW Phone Number: 09/09/2023, 11:42 AM  Clinical Narrative:     CSW met with pt at bedside. He said that regarding SNF that he would prefer to go home with home health versus SNF.   RNCM notified of preference for home with home health and will follow up with pt regarding. CSW notified provider and acute rehab team of preference for pt's change in disposition.    TOC will continue to follow.   Expected Discharge Plan: Skilled Nursing Facility Barriers to Discharge: Continued Medical Work up, SNF Pending bed offer, Insurance Authorization  Expected Discharge Plan and Services       Living arrangements for the past 2 months: Apartment                                       Social Determinants of Health (SDOH) Interventions SDOH Screenings   Food Insecurity: No Food Insecurity (09/05/2023)  Housing: Low Risk  (09/05/2023)  Transportation Needs: No Transportation Needs (09/05/2023)  Utilities: Not At Risk (09/05/2023)  Depression (PHQ2-9): Medium Risk (07/14/2023)  Social Connections: Unknown (09/05/2023)  Tobacco Use: Medium Risk (09/05/2023)    Readmission Risk Interventions    09/07/2023   11:19 AM  Readmission Risk Prevention Plan  Post Dischage Appt Complete  Medication Screening Complete  Transportation Screening Complete

## 2023-09-09 NOTE — Telephone Encounter (Signed)
 I would postpone his pre-visit and colonoscopy for at least a few months to allow him to recover. Thanks

## 2023-09-09 NOTE — Assessment & Plan Note (Signed)
 Pain regimen: restarting PO regimen with Tylenol 1000mg  q6h, oxycodone 5-10mg  q4h prn with dilaudid 0.5 mg q6h prn for breakthrough. Continue lidocaine patch  pain. Given patient is still experiencing pain, will add on tizanidine 2 mg at bedtime.  - Encourage OOB, PT/OT following

## 2023-09-09 NOTE — Plan of Care (Signed)
  Problem: Education: Goal: Knowledge of General Education information will improve Description: Including pain rating scale, medication(s)/side effects and non-pharmacologic comfort measures Outcome: Progressing   Problem: Health Behavior/Discharge Planning: Goal: Ability to manage health-related needs will improve Outcome: Progressing   Problem: Coping: Goal: Level of anxiety will decrease Outcome: Progressing   Problem: Elimination: Goal: Will not experience complications related to bowel motility Outcome: Progressing Goal: Will not experience complications related to urinary retention Outcome: Progressing   Problem: Pain Managment: Goal: General experience of comfort will improve and/or be controlled Outcome: Progressing   Problem: Safety: Goal: Ability to remain free from injury will improve Outcome: Progressing

## 2023-09-09 NOTE — Telephone Encounter (Signed)
 Dr. Adela Lank, This patient is currently inpatient at [facility].  Please advise if this patient's PV and procedure need to be cancelled and the patient notified via letter that once he is stable and able to complete the PV and procedure to call the office and reschedule? Or the appropriate next step as his PV is scheduled in rm 52 on 09/23/2023. Please/thank you Bre

## 2023-09-09 NOTE — Assessment & Plan Note (Addendum)
 Baseline appears to be around ~2. Appears to be resolving   - continue IVF with 100 mL/hr LR - will consider d/c today  - continue to hold lisinopril  - AM BMP

## 2023-09-09 NOTE — Assessment & Plan Note (Signed)
 Hypothyroidism: continue home synthroid  HTN: continue to hold home lisinopril, will add back home coreg 6.25 mg BID given tachycardia and elevated BP  HLD: continue statin  GERD: continue home pepcid

## 2023-09-09 NOTE — Progress Notes (Signed)
 Mobility Specialist: Progress Note   09/09/23 1558  Mobility  Activity Ambulated with assistance in hallway  Level of Assistance Standby assist, set-up cues, supervision of patient - no hands on  Assistive Device Front wheel walker  Distance Ambulated (ft) 300 ft  Activity Response Tolerated well  Mobility Referral Yes  Mobility visit 1 Mobility  Mobility Specialist Start Time (ACUTE ONLY) 1515  Mobility Specialist Stop Time (ACUTE ONLY) 1528  Mobility Specialist Time Calculation (min) (ACUTE ONLY) 13 min    Pt was agreeable to mobility session - received in bed. Stated he was having some shoulder pain but otherwise no complaints. SV throughout. Stated he is open to trial without the RW next session. Returned to room without fault. Left at bedside using urinal with all needs met, call bell in reach.   Maurene Capes Mobility Specialist Please contact via SecureChat or Rehab office at [phone]

## 2023-09-09 NOTE — TOC Progression Note (Signed)
 Transition of Care [name]) - Progression Note    Patient Details  Name: TINY RIETZ MRN: [id number] Date of Birth: 08-24-1950  Transition of Care [facility]) CM/SW Contact  Janae Bridgeman, RN Phone Number: 09/09/2023, 4:44 PM  Clinical Narrative:    CM met with the patient at the bedside to discuss TOC needs.  The patient declines SNf placement and wants to return home with home health services.  Patient was provided with Medicare choice regarding home health services and DME and he did not have a preference.  I called and spoke with [employer] and requested delivery of Rw and 3:1 to the bedside.  I called Bjorn Loser, RNCM and requested home health services.  Patient plans to discharge home with family when stable.   Expected Discharge Plan: Home w Home Health Services Barriers to Discharge: Continued Medical Work up  Expected Discharge Plan and Services   Discharge Planning Services: CM Consult Post Acute Care Choice: Home Health Living arrangements for the past 2 months: Apartment                 DME Arranged: 3-N-1, Walker rolling DME Agency: [employer] Date DME Agency Contacted: 09/09/23 Time DME Agency Contacted: [phone] Representative spoke with at DME Agency: Vaughan Basta, CM with [employer] - will deliver 3:1 and RW to bedside HH Arranged: PT, OT HH Agency: [facility] Date [facility] Agency Contacted: 09/09/23 Time HH Agency Contacted: 1643 Representative spoke with at [facility] Agency: Bjorn Loser - RNCM will check on availability for services   Social Determinants of Health (SDOH) Interventions SDOH Screenings   Food Insecurity: No Food Insecurity (09/05/2023)  Housing: Low Risk  (09/05/2023)  Transportation Needs: No Transportation Needs (09/05/2023)  Utilities: Not At Risk (09/05/2023)  Depression (PHQ2-9): Medium Risk (07/14/2023)  Social Connections: Unknown (09/05/2023)  Tobacco Use: Medium Risk (09/05/2023)    Readmission Risk Interventions    09/07/2023    11:19 AM  Readmission Risk Prevention Plan  Post Dischage Appt Complete  Medication Screening Complete  Transportation Screening Complete

## 2023-09-09 NOTE — Progress Notes (Signed)
 Physical Therapy Treatment Patient Details Name: [name] MRN: [id number] DOB: 12-15-1950 Today's Date: 09/09/2023   History of Present Illness 73 y.o. male presenting 09/05/23 with syncope and fall. Left rib fxs 5-7, 10-11; CT head no acute changes; troponins negative; PMH-CKD, HLD, HTN, prediabetes, Stroke    PT Comments  Pt pleasant and reports feeling much closer to baseline than on admission. Pt able to walk long hall distance with RW, complete sit to stands and standing HEP. Return home with HHPT and DME appropriate. Pt encouraged to continue gait acutely and will continue to follow.      If plan is discharge home, recommend the following: A little help with walking and/or transfers;Assistance with cooking/housework;Assist for transportation   Can travel by private vehicle     Yes  Equipment Recommendations  Rolling walker (2 wheels);BSC/3in1    Recommendations for Other Services       Precautions / Restrictions Precautions Precautions: Fall Recall of Precautions/Restrictions: Intact     Mobility  Bed Mobility               General bed mobility comments: pt in chair on arrival and end of session    Transfers Overall transfer level: Modified independent                 General transfer comment: pt performed 10 sit to stands from recliner without UB use    Ambulation/Gait Ambulation/Gait assistance: Supervision Gait Distance (Feet): 200 Feet Assistive device: Rolling walker (2 wheels) Gait Pattern/deviations: Step-through pattern, Decreased stride length   Gait velocity interpretation: 1.31 - 2.62 ft/sec, indicative of limited community ambulator   General Gait Details: cues for posture and proximity to RW, pt able to self-regulate increased gait tolerance   Stairs             Wheelchair Mobility     Tilt Bed    Modified Rankin (Stroke Patients Only)       Balance Overall balance assessment: Needs assistance   Sitting  balance-Leahy Scale: Good     Standing balance support: Bilateral upper extremity supported Standing balance-Leahy Scale: Fair Standing balance comment: UB support on RW with movement, static stand without support                            Communication Communication Communication: No apparent difficulties  Cognition Arousal: Alert Behavior During Therapy: WFL for tasks assessed/performed   PT - Cognitive impairments: No apparent impairments                         Following commands: Intact      Cueing Cueing Techniques: Verbal cues  Exercises Total Joint Exercises Standing Hip Extension: AROM, Both, 15 reps, Standing    General Comments        Pertinent Vitals/Pain Pain Assessment Pain Score: 3  Pain Location: left chest Pain Descriptors / Indicators: Aching Pain Intervention(s): Limited activity within patient's tolerance, Monitored during session, Repositioned    Home Living                          Prior Function            PT Goals (current goals can now be found in the care plan section) Progress towards PT goals: Progressing toward goals    Frequency    Min 2X/week      PT Plan  Co-evaluation              AM-PAC PT "6 Clicks" Mobility   Outcome Measure  Help needed turning from your back to your side while in a flat bed without using bedrails?: None Help needed moving from lying on your back to sitting on the side of a flat bed without using bedrails?: None Help needed moving to and from a bed to a chair (including a wheelchair)?: None Help needed standing up from a chair using your arms (e.g., wheelchair or bedside chair)?: None Help needed to walk in hospital room?: A Little Help needed climbing 3-5 steps with a railing? : A Little 6 Click Score: 22    End of Session   Activity Tolerance: Patient tolerated treatment well Patient left: in chair;with call bell/phone within reach Nurse  Communication: Mobility status PT Visit Diagnosis: Other abnormalities of gait and mobility (R26.89)     Time: 9811-9147 PT Time Calculation (min) (ACUTE ONLY): 19 min  Charges:    $Gait Training: 8-22 mins PT General Charges $$ ACUTE PT VISIT: 1 Visit                     Merryl Hacker, PT Acute Rehabilitation Services Office: [phone]    Enedina Finner Alexis Reber 09/09/2023, 12:28 PM

## 2023-09-09 NOTE — Progress Notes (Signed)
     Daily Progress Note Intern Pager: [phone]  Patient name: [name] Medical record number: [id number] Date of birth: 07-28-1950 Age: 73 y.o. Gender: male  Primary Care Provider: Para March, DO Consultants: Trauma Surgery Code Status: DNR/DNI  Pt Overview and Major Events to Date:  3/30: admitted   Assessment and Plan: Patient is 73 year old with PMHx HTN, HLD and HFmrEF admitted for syncope work up and rib fractures. Patient developed Ileus while admitted and is currently being managed for this along with his rib pain.  Assessment & Plan Rib fractures Pain regimen: restarting PO regimen with Tylenol 1000mg  q6h, oxycodone 5-10mg  q4h prn with dilaudid 0.5 mg q6h prn for breakthrough. Continue lidocaine patch  pain. Given patient is still experiencing pain, will add on tizanidine 2 mg at bedtime.  - Encourage OOB, PT/OT following  Ileus (HCC) Reports he is feeling much better. On exam, much less distended and has active bowel sounds. Has had multiple small Bms. - NPO for now, will consider advancing diet today  - Continue IVF, may d/c if starting diet   - continue miralax and senna   Acute Kidney Injury on Chronic kidney disease, stage 3b (HCC) Baseline appears to be around ~2. Appears to be resolving   - continue IVF with 100 mL/hr LR - will consider d/c today  - continue to hold lisinopril  - AM BMP  Chronic health problem Hypothyroidism: continue home synthroid  HTN: continue to hold home lisinopril, will add back home coreg 6.25 mg BID given tachycardia and elevated BP  HLD: continue statin  GERD: continue home pepcid    FEN/GI: NPO sips w/ meds  PPx: lovenox  Dispo:SNF vs HH pending clinical improvement   Subjective:  Reports he is feeling much better   Objective: Temp:  [98.2 F (36.8 C)-98.8 F (37.1 C)] 98.2 F (36.8 C) (04/03 0441) Pulse Rate:  [104-123] 104 (04/03 0441) Resp:  [16-18] 18 (04/03 0441) BP: (137-183)/(90-118) 137/90 (04/03  0441) SpO2:  [96 %-100 %] 100 % (04/03 0441) Physical Exam: General: sitting in chair comfortably, NAD Cardiovascular: RRR, no m/r/g Respiratory: NWOB on RA Abdomen: mildly distended, soft, normoactive bowel sounds  Extremities: no LE edema   Laboratory: Most recent CBC Lab Results  Component Value Date   WBC 11.4 (H) 09/07/2023   HGB 13.4 09/07/2023   HCT 43.0 09/07/2023   MCV 77.5 (L) 09/07/2023   PLT 217 09/07/2023   Most recent BMP    Latest Ref Rng & Units 09/08/2023    6:01 AM  BMP  Glucose 70 - 99 mg/dL 147   BUN 8 - 23 mg/dL 38   Creatinine 8.29 - 1.24 mg/dL 5.62   Sodium 130 - 865 mmol/L 139   Potassium 3.5 - 5.1 mmol/L 4.6   Chloride 98 - 111 mmol/L 106   CO2 22 - 32 mmol/L 21   Calcium 8.9 - 10.3 mg/dL 8.6    Mag: 2.1 Phos: 3.6   Imaging/Diagnostic Tests:  KUB 4/2: Stable diffuse bowel distension. Again ileus is favored. Recommend continued follow up surveillance and correlation with specific clinical presentation Penne Lash, MD 09/09/2023, 7:36 AM  PGY-1, [facility] Family Medicine FPTS Intern pager: [phone], text pages welcome Secure chat group [facility] Teaching Service

## 2023-09-09 NOTE — Assessment & Plan Note (Addendum)
 Reports he is feeling much better. On exam, much less distended and has active bowel sounds. Has had multiple small Bms. - NPO for now, will consider advancing diet today  - Continue IVF, may d/c if starting diet   - continue miralax and senna

## 2023-09-09 NOTE — Progress Notes (Signed)
 [facility] Progress Note     Subjective: Up in the chair - states he feels a little better. Reports several loose BMs already this morning.  Softer abd with no nausea or vomiting.  He is hungry    Objective: Vital signs in last 24 hours: Temp:  [98 F (36.7 C)-98.8 F (37.1 C)] 98 F (36.7 C) (04/03 0748) Pulse Rate:  [103-123] 103 (04/03 0748) Resp:  [16-18] 18 (04/03 0441) BP: (137-183)/(90-118) 151/100 (04/03 0748) SpO2:  [96 %-100 %] 98 % (04/03 0748) Last BM Date : 09/08/23  Intake/Output from previous day: 04/02 0701 - 04/03 0700 In: 1515.1 [P.O.:120; I.V.:1195.1; IV Piggyback:200] Out: 985 [Urine:985] Intake/Output this shift: Total I/O In: -  Out: 175 [Urine:175]  PE: Gen:  Alert, NAD, cooperative Pulm:  Normal effort ORA,chest wall tenderness stable Abd: Softer, nontender, less distended, but still with some distention. Skin: warm and dry, no rashes  Psych: A&Ox3   Lab Results:  Recent Labs    09/07/23 0841 09/09/23 0719  WBC 11.4* 10.7*  HGB 13.4 11.3*  HCT 43.0 35.3*  PLT 217 204   BMET Recent Labs    09/08/23 0601 09/09/23 0719  NA 139 137  K 4.6 4.7  CL 106 104  CO2 21* 22  GLUCOSE 122* 99  BUN 38* 28*  CREATININE 2.45* 2.05*  CALCIUM 8.6* 8.9   PT/INR No results for input(s): "LABPROT", "INR" in the last 72 hours. CMP     Component Value Date/Time   NA 137 09/09/2023 0719   NA 140 07/19/2023 1016   K 4.7 09/09/2023 0719   CL 104 09/09/2023 0719   CO2 22 09/09/2023 0719   GLUCOSE 99 09/09/2023 0719   BUN 28 (H) 09/09/2023 0719   BUN 24 07/19/2023 1016   CREATININE 2.05 (H) 09/09/2023 0719   CALCIUM 8.9 09/09/2023 0719   PROT 7.8 09/05/2023 1010   PROT 7.9 07/19/2023 1016   ALBUMIN 3.5 09/05/2023 1010   ALBUMIN 4.0 07/19/2023 1016   AST 32 09/05/2023 1010   ALT 33 09/05/2023 1010   ALKPHOS 53 09/05/2023 1010   BILITOT 0.9 09/05/2023 1010   BILITOT 0.3 07/19/2023 1016   GFRNONAA 34 (L) 09/09/2023 0719    GFRAA 34 (L) 08/19/2015 1241   Lipase     Component Value Date/Time   LIPASE 64 (H) 02/01/2012 0603       Studies/Results: DG Abd Portable 1V Result Date: 09/08/2023 CLINICAL DATA:  Ileus EXAM: PORTABLE ABDOMEN - 1 VIEW COMPARISON:  X-ray 09/07/2023. FINDINGS: Overlapping cardiac leads. Surgical clips right upper quadrant. Several dilated air-filled loops of small and large bowel are identified. Extent distribution are similar to previous. There is air in the rectum. No obvious free air on these portable supine radiographs. Slight elevation left hemidiaphragm. Basilar lower lung base opacities are again noted. IMPRESSION: Stable diffuse bowel distension. Again ileus is favored. Recommend continued follow up surveillance and correlation with specific clinical presentation Electronically Signed   By: Karen Kays M.D.   On: 09/08/2023 13:45    Anti-infectives: Anti-infectives (From admission, onward)    None        Assessment/Plan 13 y/p M s/p syncope and fall with L Rib FX 6-7. 10-11, no pneumothorax -Afebrile, VSS, WBC 10.7 yesterday -Multimodal pain control: tylenol to 1,000mg  QID, tizanidine, oxy, lidocaine patch -OOB and mobilize. Continue IS.  -PT/OT  Adynamic ileus  -seems to be improving -adv to CLD, then may ADAT given he is having bowel function and overall  seems to be doing ok  FEN: CLD, may ADAT ID: none indicated VTE: SCD's, Lovenox daily - renally adjusted per pharmacy due to CKD Foley: none, spont voids Dispo: med-surg, PT/OT, supportive care for ileus   CKD HTN HLD PMH CVA   Patient overall seems to be improving.  He may have his diet advanced as tolerates per the primary service.  Trauma service will be available as needed.     LOS: 4 days   I reviewed nursing notes, hospitalist notes, last 24 h vitals and pain scores, last 48 h intake and output, last 24 h labs and trends, and last 24 h imaging results.   Letha Cape, [facility]  Surgery Please see Amion for pager number during day hours 7:00am-4:30pm

## 2023-09-09 NOTE — Plan of Care (Signed)
  Problem: Education: Goal: Knowledge of General Education information will improve Description: Including pain rating scale, medication(s)/side effects and non-pharmacologic comfort measures Outcome: Progressing   Problem: Health Behavior/Discharge Planning: Goal: Ability to manage health-related needs will improve Outcome: Progressing   Problem: Clinical Measurements: Goal: Will remain free from infection Outcome: Progressing Goal: Respiratory complications will improve Outcome: Progressing Goal: Cardiovascular complication will be avoided Outcome: Progressing   Problem: Activity: Goal: Risk for activity intolerance will decrease Outcome: Progressing   Problem: Nutrition: Goal: Adequate nutrition will be maintained Outcome: Progressing   Problem: Coping: Goal: Level of anxiety will decrease Outcome: Progressing   Problem: Elimination: Goal: Will not experience complications related to bowel motility Outcome: Progressing Goal: Will not experience complications related to urinary retention Outcome: Progressing   Problem: Pain Managment: Goal: General experience of comfort will improve and/or be controlled Outcome: Progressing   Problem: Safety: Goal: Ability to remain free from injury will improve Outcome: Progressing   Problem: Skin Integrity: Goal: Risk for impaired skin integrity will decrease Outcome: Progressing

## 2023-09-10 ENCOUNTER — Other Ambulatory Visit (HOSPITAL_COMMUNITY): Payer: Self-pay

## 2023-09-10 DIAGNOSIS — R55 Syncope and collapse: Secondary | ICD-10-CM | POA: Diagnosis not present

## 2023-09-10 LAB — BASIC METABOLIC PANEL WITH GFR
Anion gap: 8 (ref 5–15)
BUN: 27 mg/dL — ABNORMAL HIGH (ref 8–23)
CO2: 24 mmol/L (ref 22–32)
Calcium: 9.2 mg/dL (ref 8.9–10.3)
Chloride: 104 mmol/L (ref 98–111)
Creatinine, Ser: 2.12 mg/dL — ABNORMAL HIGH (ref 0.61–1.24)
GFR, Estimated: 32 mL/min — ABNORMAL LOW (ref >60)
Glucose, Bld: 104 mg/dL — ABNORMAL HIGH (ref 70–99)
Potassium: 4.3 mmol/L (ref 3.5–5.1)
Sodium: 136 mmol/L (ref 135–145)

## 2023-09-10 LAB — PHOSPHORUS: Phosphorus: 2.9 mg/dL (ref 2.5–4.6)

## 2023-09-10 LAB — MAGNESIUM: Magnesium: 2.1 mg/dL (ref 1.7–2.4)

## 2023-09-10 LAB — GLUCOSE, CAPILLARY: Glucose-Capillary: 97 mg/dL (ref 70–99)

## 2023-09-10 MED ORDER — TIZANIDINE HCL 2 MG PO TABS
2.0000 mg | ORAL_TABLET | Freq: Two times a day (BID) | ORAL | 0 refills | Status: DC | PRN
  Filled 2023-09-10: qty 30, 15d supply, fill #0

## 2023-09-10 MED ORDER — LIDOCAINE 5 % EX PTCH
2.0000 | MEDICATED_PATCH | CUTANEOUS | 0 refills | Status: DC
  Filled 2023-09-10: qty 30, 15d supply, fill #0

## 2023-09-10 MED ORDER — OXYCODONE HCL 5 MG PO TABS
5.0000 mg | ORAL_TABLET | Freq: Four times a day (QID) | ORAL | 0 refills | Status: DC | PRN
  Filled 2023-09-10: qty 10, 3d supply, fill #0

## 2023-09-10 MED ORDER — OXYCODONE HCL 5 MG PO TABS: 5.0000 mg | ORAL_TABLET | Freq: Four times a day (QID) | ORAL | Status: DC | PRN

## 2023-09-10 MED ORDER — POLYETHYLENE GLYCOL 3350 17 G PO PACK: 17.0000 g | PACK | Freq: Two times a day (BID) | ORAL | Status: DC

## 2023-09-10 NOTE — TOC Transition Note (Signed)
 Transition of Care [facility]) - Discharge Note   Patient Details  Name: [name] MRN: [id number] Date of Birth: Sep 16, 1950  [facility] CM/SW Contact:  Janae Bridgeman, RN Phone Number: 09/10/2023, 10:58 AM   Clinical Narrative:    CM spoke with [facility] and the patient is approved for home health services.    [employer] delivered DME to the room including RW and 3:1.  Patient plans to call family to provide transportation to home by car when medically stable for discharge.   Final next level of care: Home w Home Health Services Barriers to Discharge: Continued Medical Work up   Patient Goals and CMS Choice Patient states their goals for this hospitalization and ongoing recovery are:: to return home CMS Medicare.gov Compare Post Acute Care list provided to:: Patient Choice offered to / list presented to : Patient [place] ownership interest in [facility].provided to:: Patient    Discharge Placement                       Discharge Plan and Services Additional resources added to the After Visit Summary for     Discharge Planning Services: CM Consult Post Acute Care Choice: Home Health          DME Arranged: 3-N-1, Walker rolling DME Agency: [employer] Date DME Agency Contacted: 09/09/23 Time DME Agency Contacted: [phone] Representative spoke with at DME Agency: Vaughan Basta, CM with [employer] - will deliver 3:1 and RW to bedside HH Arranged: PT, OT HH Agency: [facility] Date [facility] Agency Contacted: 09/09/23 Time HH Agency Contacted: 1643 Representative spoke with at [facility] Agency: Bjorn Loser - RNCM will check on availability for services  Social Drivers of Health (SDOH) Interventions SDOH Screenings   Food Insecurity: No Food Insecurity (09/05/2023)  Housing: Low Risk  (09/05/2023)  Transportation Needs: No Transportation Needs (09/05/2023)  Utilities: Not At Risk (09/05/2023)  Depression (PHQ2-9): Medium Risk (07/14/2023)   Social Connections: Unknown (09/05/2023)  Tobacco Use: Medium Risk (09/05/2023)     Readmission Risk Interventions    09/07/2023   11:19 AM  Readmission Risk Prevention Plan  Post Dischage Appt Complete  Medication Screening Complete  Transportation Screening Complete

## 2023-09-10 NOTE — Plan of Care (Signed)

## 2023-09-10 NOTE — Progress Notes (Signed)
     Daily Progress Note Intern Pager: [phone]  Patient name: [name] Medical record number: [id number] Date of birth: 09-12-1950 Age: 73 y.o. Gender: male  Primary Care Provider: Para March, DO Consultants: Trauma Surgery Code Status: DNR/DNI  Pt Overview and Major Events to Date:  3/30: admitted   Assessment and Plan: Patient is a 73 yo M with PMHx of HTN, HLD, MFmfREF admitted for syncope work up and rib fractures, who developed ileus while admitted. Patient continues to have improved bowel function and better management of his pain.   Assessment & Plan Rib fractures Patient doing well with currently pain regimen. Will likely discharge home with HHPT - tylenol 1000mg  q6h, tizanidine 2mg  BID, oxycodone 5-10 mg q4h prn, will space out oxycodone q6h prn and d/c dilaudid. Continue lidocaine patch. - Encourage OOB, PT/OT following     Ileus (HCC) Appears to be doing better, having more bowel movements with decreased abdominal distention. Will advance diet to soft and continue to ADAT.  - ADAT - continue miralax and senna - watch I/Os   Acute Kidney Injury on Chronic kidney disease, stage 3b (HCC) Cr 2.12 from 2.05. Baseline around 2.  - continue to hold lisinopril, likely to restart outpatient  - AM BMP   Chronic health problem Hypothyroidism: Continue home Synthroid HTN:  holding lisinopril  HLD: Continue statin GERD: Continue Pepcid  FEN/GI: Clear liquid diet, will advance as tolerated today PPx: Lovenox Dispo: Likely home with home health  Subjective:  Patient reports doing much better   Objective: Temp:  [97.3 F (36.3 C)-98 F (36.7 C)] 97.9 F (36.6 C) (04/03 2046) Pulse Rate:  [90-103] 95 (04/03 2046) Resp:  [18-20] 18 (04/03 2046) BP: (129-162)/(89-103) 129/89 (04/03 2046) SpO2:  [98 %-100 %] 100 % (04/03 2046) Physical Exam: General: well appearing, NAD  Cardiovascular: RRR, no m/r/g Respiratory: CTAB Abdomen: mildly distended, soft,  NTTP Extremities: no LE edema   Laboratory: Most recent CBC Lab Results  Component Value Date   WBC 10.7 (H) 09/09/2023   HGB 11.3 (L) 09/09/2023   HCT 35.3 (L) 09/09/2023   MCV 75.6 (L) 09/09/2023   PLT 204 09/09/2023   Most recent BMP    Latest Ref Rng & Units 09/09/2023    7:19 AM  BMP  Glucose 70 - 99 mg/dL 99   BUN 8 - 23 mg/dL 28   Creatinine 1.47 - 1.24 mg/dL 8.29   Sodium 562 - 130 mmol/L 137   Potassium 3.5 - 5.1 mmol/L 4.7   Chloride 98 - 111 mmol/L 104   CO2 22 - 32 mmol/L 22   Calcium 8.9 - 10.3 mg/dL 8.9     Imaging/Diagnostic Tests: No new imaging Penne Lash, MD 09/10/2023, 7:25 AM  PGY-1, [facility] FPTS Intern pager: [phone], text pages welcome Secure chat group [facility] Teaching Service

## 2023-09-10 NOTE — Discharge Summary (Signed)
 Family Medicine Teaching [facility] Discharge Summary  Patient name: [name] Medical record number: [id number] Date of birth: 09-27-1950 Age: 73 y.o. Gender: male Date of Admission: 09/05/2023  Date of Discharge: 09/10/23  Admitting Physician: Cyndia Skeeters, DO  Primary Care Provider: Para March, DO Consultants: Trauma Surgery   Indication for Hospitalization: Syncope work up and rib pain  Discharge Diagnoses/Problem List:  Principal Problem for Admission: Syncope work up, Rib fracture, Ileus  Other Problems addressed during stay:  Principal Problem:   Syncope Active Problems:   Acute Kidney Injury on Chronic kidney disease, stage 3b (HCC)   Chronic health problem   Rib fractures   Acute renal failure superimposed on stage 3b chronic kidney disease (HCC)   Dehydration   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   His hospital course is detailed below:  Syncope H/o syncope/near syncope in the past, last requiring hospitalization in 2022.  On this admission cardiac workup including EKG, troponins negative.  Echo showed mild decrease in EF to 45-50% without valve abnormalities. Orthostatics negative. Likely secondary to vasovagal episode.   Rib fractures Patient found to have fractured left ribs 6-7, 10-11.  No evidence of pneumothorax on chest CT.  Pain was managed with Tylenol, oxycodone, Dilaudid and lidocaine patches. Patient was discharged scheduled tylenol, prn oxycodone and tizanidine. Patient to have home health PT services.   Hyperkalemia On presentation potassium elevated to 5.4, resolved spontaneously on recheck.  Remained stable throughout the rest of hospitalization  Ileus On 4/1, found to be distended and KUB  was consistent with adynamic ileus, likely in setting of opoid use. Patient was made NPO, given MIVF and NG tube was offered, though patient ultimately declined. Patient continued to pass flatus and have small BM with advancement of diet by discharge.  Other chronic conditions were medically managed with home medications and formulary alternatives as necessary (HTN, HLD, hypothyroidism, GERD)  PCP Follow-up Recommendations: Echo shows new HFmrEF, patient will need cardiology referral. Consider starting GDMT Lisinopril held due to AKI in hospital, recommend following up on BP control and renal function Restarted Coreg in the hospital, not sure if he was compliant before, follow up on heart rate   Disposition: Home with HH/HHPT  Discharge Condition: stable   Discharge Exam:  Vitals:   09/09/23 2046 09/10/23 0826  BP: 129/89 (!) 143/99  Pulse: 95 93  Resp: 18   Temp: 97.9 F (36.6 C) 97.8 F (36.6 C)  SpO2: 100% 99%   General: well appearing, NAD  Cardiovascular: RRR, no m/r/g Respiratory: CTAB Abdomen: mildly distended, soft, NTTP Extremities: no LE edema   Significant Procedures: None  Significant Labs and Imaging:  Recent Labs  Lab 09/09/23 0719  WBC 10.7*  HGB 11.3*  HCT 35.3*  PLT 204   Recent Labs  Lab 09/09/23 0719 09/10/23 0829  NA 137 136  K 4.7 4.3  CL 104 104  CO2 22 24  GLUCOSE 99 104*  BUN 28* 27*  CREATININE 2.05* 2.12*  CALCIUM 8.9 9.2  MG 2.2 2.1  PHOS 3.1 2.9    Pertinent Imaging   CT Chest 3/20: 1. Numerous acute left rib fractures including: posterior aspect of the left fifth, sixth, seventh, tenth and eleventh ribs. Nondisplaced fracture noted involving  the lateral aspect of the left seventh rib. 2. Bilateral lower lobe, lingular and right middle lobe subsegmental atelectasis with volume loss. 3. No pneumothorax. 4. Coronary artery calcifications.  CXR 4/1: 1. Multiple left posterior and lateral rib fracture  deformities involving the left fifth, sixth, seventh, tenth and eleventh ribs. 2. Bibasilar atelectasis.  KUB 4/1: 1. Mild gas dilatation of the small intestine with nondilated gas distension in the large bowel to the junction of the descending with the sigmoid colon. Ileus is favored over mechanical obstruction in this case, but clinical correlation and radiographic follow-up recommended. 2. Cardiomegaly.  KUB 4/2:  Stable diffuse bowel distension. Again ileus is favored. Recommend continued follow up surveillance and correlation with specific clinical presentation  Results/Tests Pending at Time of Discharge:  None  Discharge Medications:  Allergies as of 09/10/2023       Reactions   Aspirin Other (See Comments)   Stomach upset   Aspirin-acetaminophen-caffeine Nausea Only, Other (See Comments)   Other reaction(s): Hyperactive behavior, Nausea   Chlorthalidone Diarrhea, Other (See Comments)   Low blood pressure        Medication List     PAUSE taking these medications    lisinopril 20 MG tablet Wait to take this until your doctor or other care provider tells you to start again. Commonly known as: ZESTRIL Take 1 tablet (20 mg total) by mouth at bedtime.       STOP taking these medications    clindamycin 75 MG/5ML solution Commonly known as: CLEOCIN   meclizine 25 MG tablet Commonly known as: ANTIVERT   triamcinolone cream 0.1 % Commonly known as: KENALOG       TAKE these medications    acetaminophen 500 MG tablet Commonly known as: TYLENOL Take 1,000 mg by mouth daily as needed.   Artificial Tears 1.4 % ophthalmic solution Generic drug: polyvinyl alcohol Place 1 drop into both eyes 4 (four) times daily.   atorvastatin 80 MG tablet Commonly known as: LIPITOR Take 40 mg by mouth daily.   calcium carbonate 500 MG chewable tablet Commonly known as: TUMS - dosed in mg elemental calcium Chew 1 tablet by mouth daily.   carboxymethylcellulose 1 %  ophthalmic solution Apply 1 drop to eye 4 (four) times daily as needed (for eye irritation).   carvedilol 6.25 MG tablet Commonly known as: COREG Take 6.25 mg by mouth 2 (two) times daily with a meal.   cetirizine 10 MG tablet Commonly known as: ZYRTEC Take 10 mg by mouth daily as needed for allergies.   cholecalciferol 25 MCG (1000 UNIT) tablet Commonly known as: VITAMIN D3 Take 1,000 Units by mouth daily.   famotidine 20 MG tablet Commonly known as: PEPCID Take 20 mg by mouth 2 (two) times daily.   levothyroxine 75 MCG tablet Commonly known as: SYNTHROID Take 75 mcg by mouth daily before breakfast.   lidocaine 5 % Commonly known as: LIDODERM Place 2 patches onto the skin daily. Remove & Discard patch within 12 hours or as directed by MD Start taking on: September 11, 2023   magnesium oxide 400 MG tablet Commonly known as: MAG-OX Take 400 mg by mouth 2 (two) times daily.   oxyCODONE 5 MG immediate release tablet Commonly known as: Oxy IR/ROXICODONE Take 1 tablet (5 mg total) by mouth every 6 (six) hours as needed for moderate pain (pain score 4-6) or severe pain (pain score 7-10) (5 mg for pain 4-6, 10 mg for pain score >6).   polyethylene glycol 17  g packet Commonly known as: MIRALAX / GLYCOLAX Take 17 g by mouth 2 (two) times daily.   tiZANidine 2 MG tablet Commonly known as: ZANAFLEX Take 1 tablet (2 mg total) by mouth 2 (two) times daily as needed for muscle spasms.               Durable Medical Equipment  (From admission, onward)           Start     Ordered   09/09/23 1641  For home use only DME Bedside commode  Once       Comments: 3:1 needed  Question:  Patient needs a bedside commode to treat with the following condition  Answer:  Syncope   09/09/23 1640   09/09/23 1639  For home use only DME Walker rolling  Once       Question Answer Comment  Walker: With 5 Inch Wheels   Patient needs a walker to treat with the following condition Syncope       09/09/23 1639            Discharge Instructions: Please refer to Patient Instructions section of EMR for full details.  Patient was counseled important signs and symptoms that should prompt return to medical care, changes in medications, dietary instructions, activity restrictions, and follow up appointments.   Follow-Up Appointments:  Follow-up Information     [facility] Follow up.   Why: Cindie Laroche will provide home health services.  They will call you in the next 24-48 hours to set up services.        [facility] Follow up.   Why: [facility] will provide you with a rolling walker and bedside commode. Contact information: [address] [place] [address] [phone]                 Penne Lash, MD 09/10/2023, 2:32 PM PGY-1, [facility] Family Medicine

## 2023-09-10 NOTE — Assessment & Plan Note (Signed)
 Appears to be doing better, having more bowel movements with decreased abdominal distention. Will advance diet to soft and continue to ADAT.  - ADAT - continue miralax and senna - watch I/Os

## 2023-09-10 NOTE — Assessment & Plan Note (Signed)
 Patient doing well with currently pain regimen. Will likely discharge home with HHPT - tylenol 1000mg  q6h, tizanidine 2mg  BID, oxycodone 5-10 mg q4h prn, will space out oxycodone q6h prn and d/c dilaudid. Continue lidocaine patch. - Encourage OOB, PT/OT following

## 2023-09-10 NOTE — Progress Notes (Signed)
 Mobility Specialist: Progress Note   09/10/23 1104  Mobility  Activity Ambulated with assistance in hallway  Level of Assistance Standby assist, set-up cues, supervision of patient - no hands on  Assistive Device None  Distance Ambulated (ft) 350 ft  Activity Response Tolerated well  Mobility Referral Yes  Mobility visit 1 Mobility  Mobility Specialist Start Time (ACUTE ONLY) 1050  Mobility Specialist Stop Time (ACUTE ONLY) 1055  Mobility Specialist Time Calculation (min) (ACUTE ONLY) 5 min    Pt agreeable to mobility session - received standing in room. SV throughout. No complaints. Returned to room without fault. Left standing by chair in room with all needs met, call bell in reach.   Maurene Capes Mobility Specialist Please contact via SecureChat or Rehab office at [phone]

## 2023-09-10 NOTE — Assessment & Plan Note (Addendum)
 Hypothyroidism: Continue home Synthroid HTN:  holding lisinopril  HLD: Continue statin GERD: Continue Pepcid

## 2023-09-10 NOTE — Assessment & Plan Note (Addendum)
 Cr 2.12 from 2.05. Baseline around 2.  - continue to hold lisinopril, likely to restart outpatient  - AM BMP

## 2023-09-13 ENCOUNTER — Telehealth: Payer: Self-pay

## 2023-09-13 DIAGNOSIS — I5020 Unspecified systolic (congestive) heart failure: Secondary | ICD-10-CM | POA: Diagnosis not present

## 2023-09-13 DIAGNOSIS — S2242XD Multiple fractures of ribs, left side, subsequent encounter for fracture with routine healing: Secondary | ICD-10-CM | POA: Diagnosis not present

## 2023-09-13 DIAGNOSIS — G319 Degenerative disease of nervous system, unspecified: Secondary | ICD-10-CM | POA: Diagnosis not present

## 2023-09-13 DIAGNOSIS — I130 Hypertensive heart and chronic kidney disease with heart failure and stage 1 through stage 4 chronic kidney disease, or unspecified chronic kidney disease: Secondary | ICD-10-CM | POA: Diagnosis not present

## 2023-09-13 DIAGNOSIS — N1832 Chronic kidney disease, stage 3b: Secondary | ICD-10-CM | POA: Diagnosis not present

## 2023-09-13 DIAGNOSIS — I2510 Atherosclerotic heart disease of native coronary artery without angina pectoris: Secondary | ICD-10-CM | POA: Diagnosis not present

## 2023-09-13 NOTE — Telephone Encounter (Signed)
 Liji from [employer] calling for PT verbal orders as follows:  2 time(s) weekly for 2 week(s), then 1 time(s) weekly for 4 week(s)  Verbal orders given per [facility] protocol  Veronda Prude, RN

## 2023-09-14 NOTE — Telephone Encounter (Signed)
 Called and spoke with patient - patient advised of provider's recommendations- patient is agreeable to plan of care and will call back to reschedule at a later date;  PV and procedure appts cancelled;

## 2023-09-20 ENCOUNTER — Encounter: Payer: Self-pay | Admitting: Family Medicine

## 2023-09-20 ENCOUNTER — Other Ambulatory Visit: Payer: Self-pay | Admitting: Family Medicine

## 2023-09-20 ENCOUNTER — Ambulatory Visit: Admitting: Family Medicine

## 2023-09-20 VITALS — BP 170/98 | HR 104 | Ht 70.0 in | Wt 165.2 lb

## 2023-09-20 DIAGNOSIS — N1832 Chronic kidney disease, stage 3b: Secondary | ICD-10-CM | POA: Diagnosis not present

## 2023-09-20 DIAGNOSIS — S2242XD Multiple fractures of ribs, left side, subsequent encounter for fracture with routine healing: Secondary | ICD-10-CM

## 2023-09-20 DIAGNOSIS — I10 Essential (primary) hypertension: Secondary | ICD-10-CM

## 2023-09-20 DIAGNOSIS — M7989 Other specified soft tissue disorders: Secondary | ICD-10-CM

## 2023-09-20 DIAGNOSIS — I2510 Atherosclerotic heart disease of native coronary artery without angina pectoris: Secondary | ICD-10-CM | POA: Diagnosis not present

## 2023-09-20 DIAGNOSIS — I5022 Chronic systolic (congestive) heart failure: Secondary | ICD-10-CM | POA: Diagnosis not present

## 2023-09-20 DIAGNOSIS — G319 Degenerative disease of nervous system, unspecified: Secondary | ICD-10-CM | POA: Diagnosis not present

## 2023-09-20 DIAGNOSIS — I130 Hypertensive heart and chronic kidney disease with heart failure and stage 1 through stage 4 chronic kidney disease, or unspecified chronic kidney disease: Secondary | ICD-10-CM | POA: Diagnosis not present

## 2023-09-20 DIAGNOSIS — I5020 Unspecified systolic (congestive) heart failure: Secondary | ICD-10-CM | POA: Diagnosis not present

## 2023-09-20 MED ORDER — FAMOTIDINE 20 MG PO TABS
20.0000 mg | ORAL_TABLET | Freq: Every day | ORAL | 3 refills | Status: DC
Start: 2023-09-20 — End: 2024-03-08

## 2023-09-20 MED ORDER — LISINOPRIL 20 MG PO TABS: 20.0000 mg | ORAL_TABLET | Freq: Every day | ORAL | 3 refills | Status: DC

## 2023-09-20 MED ORDER — CARVEDILOL 6.25 MG PO TABS: 6.2500 mg | ORAL_TABLET | Freq: Two times a day (BID) | ORAL | 3 refills | Status: DC

## 2023-09-20 MED ORDER — CETIRIZINE HCL 10 MG PO TABS: 10.0000 mg | ORAL_TABLET | Freq: Every day | ORAL | 1 refills | Status: DC | PRN

## 2023-09-20 MED ORDER — VITAMIN D3 25 MCG (1000 UNIT) PO TABS: 1000.0000 [IU] | ORAL_TABLET | Freq: Every day | ORAL | 3 refills | Status: DC

## 2023-09-20 MED ORDER — LIDOCAINE 5 % EX PTCH: 1.0000 | MEDICATED_PATCH | CUTANEOUS | 0 refills | Status: DC

## 2023-09-20 MED ORDER — LEVOTHYROXINE SODIUM 75 MCG PO TABS: 75.0000 ug | ORAL_TABLET | Freq: Every day | ORAL | 3 refills | Status: AC

## 2023-09-20 NOTE — Progress Notes (Signed)
    SUBJECTIVE:   CHIEF COMPLAINT / HPI:   PCP Follow-up Recommendations: Echo shows new HFmrEF, patient will need cardiology referral. Consider starting GDMT Lisinopril held due to AKI in hospital, recommend following up on BP control and renal function Restarted Coreg in the hospital, not sure if he was compliant before, follow up on heart rate   Plan for colonoscopy in a few months once he has recovered   States he is overall doing well since leaving the hospital but still having intermittent pain, particularly when he has to cough or sneeze Also reports swelling in his bilateral feet States he has been out of his medications for the last 4 days  PERTINENT  PMH / PSH: HTN, portal vein thrombosis, pancreatitis, hypothyroidism, CKD3b, HLD  OBJECTIVE:   BP (!) 170/98   Pulse (!) 104   Ht 5\' 10"  (1.778 m)   Wt 165 lb 3.2 oz (74.9 kg)   SpO2 99%   BMI 23.70 kg/m   GEN: Well-appearing, no acute distress CV: Tachycardic, regular rhythm.  No murmurs.  No JVD Respiratory: Mild wheezes in bilateral bases, otherwise CTAB.  Breathing comfortably on room air Extremities: 2+ pitting edema bilateral feet, stops at ankles.  2+ DP PT pulses  ASSESSMENT/PLAN:   Assessment & Plan Hypertension, unspecified type Uncontrolled, lisinopril was paused in the hospital due to AKI.  BMP collected today but went ahead and resumed his lisinopril and refilled it. Heart failure with mildly reduced ejection fraction (HFmrEF) (HCC) Echo 09/06/2023 with LVEF 45 to 50%, mildly decreased function of left ventricle, no WMA.  Would benefit from GDMT.  Has been out of Coreg for the last 4 days. - Referral to cardiology placed - Refill Coreg and lisinopril today Bilateral swelling of feet 2+ pitting edema bilateral feet.  Suspect this may be due to new heart failure.  Will hold off on Lasix at this time given unknown kidney function, consider adding it if swelling persists or worsens - BMP checked today Closed  fracture of multiple ribs of left side with routine healing, subsequent encounter Hospital follow-up from rib fracture sustained in syncopal event.  Overall healing well, pain only with coughing and sneezing now.  States physical therapy supposed to start coming out to his house.  He did not take oxycodone pain medicine because it was too strong for him, states he is managing his pain with Tylenol and tizanidine prn - Advised to take deep breaths every hour like when he is using incentive spirometry - Lidocaine patches sent to pharmacy, these worked well for him in the hospital - Return precautions discussed, advised to let me know if he becomes more short of breath or has worsening severe pain   Follow-up in 2 to 4 weeks  Sarahann Cumins, DO [facility]

## 2023-09-20 NOTE — Patient Instructions (Addendum)
 Good to see you today - Thank you for coming in  Things we discussed today: I have restarted your lisinopril which is your blood pressure medicine.  I have referred you to cardiology for your acute heart failure with mildly reduced ejection fraction.  They should call you sometime this week to schedule an appointment.  Please make sure you are taking all of your medications.  If you are about to run out, let me know.  Elevate your feet and wear compression stockings for the swelling.  If the swelling worsens significantly or starts going up your legs please let me know.  You can apply the lidocaine patches to your ribs like you did in the hospital. Take deep breaths every hour. If you become short of breath let us  know.    Please always bring your medication bottles  Come back to see me in 1 month

## 2023-09-20 NOTE — Assessment & Plan Note (Signed)
 Hospital follow-up from rib fracture sustained in syncopal event.  Overall healing well, pain only with coughing and sneezing now.  States physical therapy supposed to start coming out to his house.  He did not take oxycodone pain medicine because it was too strong for him, states he is managing his pain with Tylenol and tizanidine prn - Advised to take deep breaths every hour like when he is using incentive spirometry - Lidocaine patches sent to pharmacy, these worked well for him in the hospital - Return precautions discussed, advised to let me know if he becomes more short of breath or has worsening severe pain

## 2023-09-20 NOTE — Assessment & Plan Note (Signed)
 Uncontrolled, lisinopril was paused in the hospital due to AKI.  BMP collected today but went ahead and resumed his lisinopril and refilled it.

## 2023-09-21 ENCOUNTER — Telehealth: Payer: Self-pay

## 2023-09-21 NOTE — Telephone Encounter (Signed)
 Received denial letter from patient's insurance. Lidocaine patches are not covered under his Part D insurance for dx of rib fracture.   Called patient and informed of update. Advised that he could pick these up over the counter.   Patient appreciative.   Elsie Halo, RN

## 2023-09-21 NOTE — Telephone Encounter (Signed)
 Patient calls nurse line regarding PA being needed on Lidocaine patches.   Submitted PA via covermymeds.   Key: [id number]  Will check back in appox 24 hours for update.   Elsie Halo, RN

## 2023-09-22 ENCOUNTER — Other Ambulatory Visit

## 2023-09-23 ENCOUNTER — Encounter

## 2023-09-23 ENCOUNTER — Other Ambulatory Visit

## 2023-09-23 DIAGNOSIS — I10 Essential (primary) hypertension: Secondary | ICD-10-CM

## 2023-09-24 LAB — BASIC METABOLIC PANEL WITH GFR
BUN/Creatinine Ratio: 14 (ref 10–24)
BUN: 28 mg/dL — ABNORMAL HIGH (ref 8–27)
CO2: 20 mmol/L (ref 20–29)
Calcium: 9.3 mg/dL (ref 8.6–10.2)
Chloride: 105 mmol/L (ref 96–106)
Creatinine, Ser: 1.99 mg/dL — ABNORMAL HIGH (ref 0.76–1.27)
Glucose: 71 mg/dL (ref 70–99)
Potassium: 4.9 mmol/L (ref 3.5–5.2)
Sodium: 141 mmol/L (ref 134–144)
eGFR: 35 mL/min/{1.73_m2} — ABNORMAL LOW (ref >59)

## 2023-09-27 ENCOUNTER — Encounter: Payer: Self-pay | Admitting: Family Medicine

## 2023-09-28 ENCOUNTER — Other Ambulatory Visit: Payer: Self-pay

## 2023-09-28 MED ORDER — TIZANIDINE HCL 2 MG PO TABS: 2.0000 mg | ORAL_TABLET | Freq: Two times a day (BID) | ORAL | 0 refills | Status: DC | PRN

## 2023-10-01 DIAGNOSIS — I130 Hypertensive heart and chronic kidney disease with heart failure and stage 1 through stage 4 chronic kidney disease, or unspecified chronic kidney disease: Secondary | ICD-10-CM | POA: Diagnosis not present

## 2023-10-01 DIAGNOSIS — G319 Degenerative disease of nervous system, unspecified: Secondary | ICD-10-CM | POA: Diagnosis not present

## 2023-10-01 DIAGNOSIS — N1832 Chronic kidney disease, stage 3b: Secondary | ICD-10-CM | POA: Diagnosis not present

## 2023-10-01 DIAGNOSIS — I5020 Unspecified systolic (congestive) heart failure: Secondary | ICD-10-CM | POA: Diagnosis not present

## 2023-10-01 DIAGNOSIS — I2510 Atherosclerotic heart disease of native coronary artery without angina pectoris: Secondary | ICD-10-CM | POA: Diagnosis not present

## 2023-10-01 DIAGNOSIS — S2242XD Multiple fractures of ribs, left side, subsequent encounter for fracture with routine healing: Secondary | ICD-10-CM | POA: Diagnosis not present

## 2023-10-07 DIAGNOSIS — N1832 Chronic kidney disease, stage 3b: Secondary | ICD-10-CM | POA: Diagnosis not present

## 2023-10-07 DIAGNOSIS — G319 Degenerative disease of nervous system, unspecified: Secondary | ICD-10-CM | POA: Diagnosis not present

## 2023-10-07 DIAGNOSIS — I2510 Atherosclerotic heart disease of native coronary artery without angina pectoris: Secondary | ICD-10-CM | POA: Diagnosis not present

## 2023-10-07 DIAGNOSIS — S2242XD Multiple fractures of ribs, left side, subsequent encounter for fracture with routine healing: Secondary | ICD-10-CM | POA: Diagnosis not present

## 2023-10-07 DIAGNOSIS — I130 Hypertensive heart and chronic kidney disease with heart failure and stage 1 through stage 4 chronic kidney disease, or unspecified chronic kidney disease: Secondary | ICD-10-CM | POA: Diagnosis not present

## 2023-10-07 DIAGNOSIS — I5020 Unspecified systolic (congestive) heart failure: Secondary | ICD-10-CM | POA: Diagnosis not present

## 2023-10-14 ENCOUNTER — Encounter: Admitting: Gastroenterology

## 2023-10-15 ENCOUNTER — Encounter (HOSPITAL_BASED_OUTPATIENT_CLINIC_OR_DEPARTMENT_OTHER): Payer: Self-pay | Admitting: Cardiology

## 2023-10-15 ENCOUNTER — Ambulatory Visit (HOSPITAL_BASED_OUTPATIENT_CLINIC_OR_DEPARTMENT_OTHER): Admitting: Cardiology

## 2023-10-15 VITALS — BP 152/98 | HR 94 | Ht 70.0 in | Wt 165.4 lb

## 2023-10-15 DIAGNOSIS — I5022 Chronic systolic (congestive) heart failure: Secondary | ICD-10-CM | POA: Diagnosis not present

## 2023-10-15 DIAGNOSIS — Z712 Person consulting for explanation of examination or test findings: Secondary | ICD-10-CM | POA: Diagnosis not present

## 2023-10-15 DIAGNOSIS — N1832 Chronic kidney disease, stage 3b: Secondary | ICD-10-CM

## 2023-10-15 DIAGNOSIS — I10 Essential (primary) hypertension: Secondary | ICD-10-CM | POA: Diagnosis not present

## 2023-10-15 DIAGNOSIS — R600 Localized edema: Secondary | ICD-10-CM

## 2023-10-15 MED ORDER — DAPAGLIFLOZIN PROPANEDIOL 10 MG PO TABS: 10.0000 mg | ORAL_TABLET | Freq: Every day | ORAL | 11 refills | Status: DC

## 2023-10-15 MED ORDER — DAPAGLIFLOZIN PROPANEDIOL 10 MG PO TABS: 10.0000 mg | ORAL_TABLET | Freq: Every day | ORAL | 0 refills | Status: DC

## 2023-10-15 NOTE — Patient Instructions (Addendum)
 Medication Instructions:  Your physician has recommended you make the following change in your medication:  START Farxiga 10 mg daily  Make sure you take the carvedilol  twice a day (not just morning). We will recheck your kidney function today now that you are back on the lisinopril . If this is ok, we will start another medication to help with your heart and kidneys.  Do the following things EVERY DAY:  Weigh yourself EVERY morning after you go to the bathroom but before you eat or drink anything. Write this number down in a weight log/diary. If you gain 3 pounds overnight or 5 pounds in a week, call the office.  Take your medicines as prescribed. If you have concerns about your medications, please call us  before you stop taking them.   Eat low salt foods--Limit salt (sodium) to 2000 mg per day. This will help prevent your body from holding onto fluid. Read food labels as many processed foods have a lot of sodium, especially canned goods and prepackaged meats. If you would like some assistance choosing low sodium foods, we would be happy to set you up with a nutritionist.  Stay as active as you can everyday. Staying active will give you more energy and make your muscles stronger. Start with 5 minutes at a time and work your way up to 30 minutes a day. Break up your activities--do some in the morning and some in the afternoon. Start with 3 days per week and work your way up to 5 days as you can.  If you have chest pain, feel short of breath, dizzy, or lightheaded, STOP. If you don't feel better after a short rest, call 911. If you do feel better, call the office to let us  know you have symptoms with exercise.  Limit all fluids for the day to less than 2 liters. Fluid includes all drinks, coffee, juice, ice chips, soup, jello, and all other liquids.    Lab Work: Your physician recommends that you return for lab work today: BMET  Follow-Up: Please follow up in 3 to 4 weeks with Dr. Veryl Gottron,  Slater Duncan, NP or Neomi Banks, NP

## 2023-10-15 NOTE — Progress Notes (Signed)
 Cardiology Office Note:  .   Date:  10/15/2023  ID:  [name], DOB 1950/08/25, MRN [id number] PCP: Sarahann Cumins, DO  [facility] Providers Cardiologist:  Sheryle Donning, MD {  History of Present Illness: .   [name] is a 73 y.o. male with PMH hypertension, unclear cardiomyopathy, history of CVA seen as a new patient.  Today: Referral from 09/20/23 from Dr. McIntyre/Dr. Becki Bouton in family medicine center. Notes from that visit comment on need for evaluation for heart failure with mildly reduced ejection fraction and hypertension.  CV history: had echo 60-65% in 2022, then had echo 08/2023 with EF 45-50%. No history of MI, history of remote stroke at [facility] >20 years ago.  We reviewed his history extensively. Had recent hospitalization for syncope, with rib fractures. Echo with mildly reduced EF at that time, had AKI so lisinopril  held at that time. Seen for follow up as an outpatient, referred to cardiology.  Has had hypertension for about 15 years, has been difficult to control. [facility] [place] Has never seen a nephrologist. Most recent Cr 1.99 (GFR 35), peaked at 2.83 while in the hospital  Has had LE edema for a long time, improves with elevation.  Reviewed medication today: Only taking carvedilol  in the morning and lisinopril  at night. Prior BMET was off of lisinopril , hasn't had a recheck since restarting. Has never discussed Jardiance or Farxiga , we discussed today. Discussed potential other meds, such as ARB/ARNI/MRA, but we may be limited by renal function/potassium with these.  Yesterday at home BP was 138/81. He is in pain from ribs today. Recheck was higher than initial.  ROS: Denies chest pain, shortness of breath at rest or with normal exertion. No PND, orthopnea, LE edema or unexpected weight gain. No syncope or palpitations. ROS otherwise negative except as noted.   Studies Reviewed: [name]    EKG:       Physical Exam:    VS:  BP (!) 152/98   Pulse 94   Ht 5\' 10"  (1.778 m)   Wt 165 lb 6.4 oz (75 kg)   SpO2 97%   BMI 23.73 kg/m    Wt Readings from Last 3 Encounters:  10/15/23 165 lb 6.4 oz (75 kg)  09/20/23 165 lb 3.2 oz (74.9 kg)  08/31/23 168 lb 6.1 oz (76.4 kg)    GEN: Well nourished, well developed in no acute distress HEENT: Normal, moist mucous membranes NECK: No JVD CARDIAC: regular rhythm, normal S1 and S2, no rubs or gallops. No murmur. VASCULAR: Radial and DP pulses 2+ bilaterally. No carotid bruits RESPIRATORY:  Clear to auscultation without rales, wheezing or rhonchi  ABDOMEN: Soft, non-tender, non-distended MUSCULOSKELETAL:  Ambulates independently SKIN: Warm and dry, trivial bilateral LE edema NEUROLOGIC:  Alert and oriented x 3. No focal neuro deficits noted. PSYCHIATRIC:  Normal affect    ASSESSMENT AND PLAN: .    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 His renal function limits options. His lisinopril  was held during recent admission due to AKI -discussed SGLT2i, given his kidney disease and EF, this is a good choice for him. Discussed potential benefits and side effects. He is amenable to starting farxiga  10 mg daily -discussed need for carvedilol  to be BID dosing to be effective. He will work on this -discussed ACEI, ARB, ARNI, MRA options  are limited with his renal function. He has been restarted on his prior lisinopril . Will check BMET to make sure renal function/K stable. May be able to change to ARB but unclear if we will be able to use ARNI or MRA, will need to be cautious -performed heart failure education today -reviewed red flag warning signs that need immediate medical attention -discussed potential for recheck echo vs. Ischemic eval. After shared decision making, will start with  medical management, but if EF not normalized after 3 mos of max tolerated GDMT, would discuss evaluation. Issue will be renal function and risk of contrast nephropathy, could consider cardiac PET first given this.  CV risk counseling and prevention -recommend heart healthy/Mediterranean diet, with whole grains, fruits, vegetable, fish, lean meats, nuts, and olive oil. Limit salt. -recommend moderate walking, 3-5 times/week for 30-50 minutes each session. Aim for at least 150 minutes.week. Goal should be pace of 3 miles/hours, or walking 1.5 miles in 30 minutes -recommend avoidance of tobacco products. Avoid excess alcohol .  Dispo: 3-4 weeks with me or APP to further titrate meds based on BP/renal function  Signed, Sheryle Donning, MD   Sheryle Donning, MD, PhD, [facility]  [facility]  [place]  [facility] & V[facility] at [facility] at [facility] [address], [address] [place], [place] [address] [phone]

## 2023-10-16 LAB — BASIC METABOLIC PANEL WITH GFR

## 2023-10-19 ENCOUNTER — Ambulatory Visit (HOSPITAL_BASED_OUTPATIENT_CLINIC_OR_DEPARTMENT_OTHER): Payer: Self-pay

## 2023-10-19 DIAGNOSIS — I10 Essential (primary) hypertension: Secondary | ICD-10-CM

## 2023-10-19 NOTE — Telephone Encounter (Signed)
-----   Message from [facility] sent at 10/18/2023  7:07 PM EDT ----- Results returned as cancelled--I am not sure if this was due to a sample problem, or some other issues. If he could, can we have him either come back here or go to another [facility] to get the BMET? Thanks.

## 2023-10-19 NOTE — Telephone Encounter (Signed)
 Called and spoke to pt. Discussed labs being cancelled - pt stated he will get labs drawn tomorrow. Repeat lab orders placed.

## 2023-10-20 ENCOUNTER — Other Ambulatory Visit (HOSPITAL_COMMUNITY): Payer: Self-pay | Admitting: Family Medicine

## 2023-10-20 DIAGNOSIS — I130 Hypertensive heart and chronic kidney disease with heart failure and stage 1 through stage 4 chronic kidney disease, or unspecified chronic kidney disease: Secondary | ICD-10-CM | POA: Diagnosis not present

## 2023-10-20 DIAGNOSIS — I10 Essential (primary) hypertension: Secondary | ICD-10-CM | POA: Diagnosis not present

## 2023-10-20 DIAGNOSIS — I2510 Atherosclerotic heart disease of native coronary artery without angina pectoris: Secondary | ICD-10-CM | POA: Diagnosis not present

## 2023-10-20 DIAGNOSIS — S2242XD Multiple fractures of ribs, left side, subsequent encounter for fracture with routine healing: Secondary | ICD-10-CM | POA: Diagnosis not present

## 2023-10-20 DIAGNOSIS — G319 Degenerative disease of nervous system, unspecified: Secondary | ICD-10-CM | POA: Diagnosis not present

## 2023-10-20 DIAGNOSIS — I5020 Unspecified systolic (congestive) heart failure: Secondary | ICD-10-CM | POA: Diagnosis not present

## 2023-10-20 DIAGNOSIS — N1832 Chronic kidney disease, stage 3b: Secondary | ICD-10-CM | POA: Diagnosis not present

## 2023-10-20 LAB — BASIC METABOLIC PANEL WITH GFR
BUN/Creatinine Ratio: 14 (ref 10–24)
BUN: 30 mg/dL — ABNORMAL HIGH (ref 8–27)
CO2: 19 mmol/L — ABNORMAL LOW (ref 20–29)
Calcium: 9.9 mg/dL (ref 8.6–10.2)
Chloride: 102 mmol/L (ref 96–106)
Creatinine, Ser: 2.1 mg/dL — ABNORMAL HIGH (ref 0.76–1.27)
Glucose: 104 mg/dL — ABNORMAL HIGH (ref 70–99)
Potassium: 4.7 mmol/L (ref 3.5–5.2)
Sodium: 137 mmol/L (ref 134–144)
eGFR: 33 mL/min/{1.73_m2} — ABNORMAL LOW (ref >59)

## 2023-10-24 ENCOUNTER — Encounter (HOSPITAL_BASED_OUTPATIENT_CLINIC_OR_DEPARTMENT_OTHER): Payer: Self-pay | Admitting: Cardiology

## 2023-10-26 ENCOUNTER — Ambulatory Visit: Admitting: Podiatry

## 2023-10-28 NOTE — Telephone Encounter (Signed)
-----   Message from [facility] sent at 10/28/2023  9:27 AM EDT ----- Appreciate him getting labs redrawn. Kidney function is abnormal but within his recent range. Potassium is ok. He has followup with Jerrold Morgan coming up, no changes to meds right now. Is he taking the carvedilol  twice daily now? How have his blood pressures been?

## 2023-10-28 NOTE — Telephone Encounter (Signed)
 Called and spoke to pt. Discussed lab results and MD's comment. Pt verified that he is taking his carvedilol  BID. His blood pressures have been ranging 138-148/80s. Pt denies any other symptoms. Verified upcoming appointment with pt.

## 2023-11-04 ENCOUNTER — Ambulatory Visit (INDEPENDENT_AMBULATORY_CARE_PROVIDER_SITE_OTHER): Admitting: Podiatry

## 2023-11-04 DIAGNOSIS — M722 Plantar fascial fibromatosis: Secondary | ICD-10-CM | POA: Diagnosis not present

## 2023-11-04 NOTE — Progress Notes (Signed)
  Subjective:  Patient ID: REAL CONA, male    DOB: May 19, 1951,  MRN: [id number]  Chief Complaint  Patient presents with   Plantar Fasciitis    Pt is here today for follow up on left foot plantar fasciitis he stated that it has gotten a little better    Discussed the use of AI scribe software for clinical note transcription with the patient, who gave verbal consent to proceed.  History of Present Illness [name] is a 73 year old male who presents with foot pain.  He notes significant improvement, he did not start physical therapy due to hospitalization following his last visit for broken ribs      Objective:    Physical Exam VASCULAR: DP and PT pulse palpable. Foot is warm and well-perfused. Capillary fill time is brisk. 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 Normal skin turgor, texture, and temperature. No open lesions, rashes, or ulcerations. NEUROLOGIC: Normal sensation to light touch and pressure. No paresthesias on examination. ORTHOPEDIC: Today little to no pain palpation plantar mid arch    Results RADIOLOGY Left foot radiographs: Pes planus deformity, mild arthritic changes in the midfoot, (08/31/2023)   Assessment:   1. Plantar fasciitis, left      Plan:  Patient was evaluated and treated and all questions answered.  Assessment and Plan Assessment & Plan Plantar fasciitis and posterior tibial tendonitis He returns for follow-up has had some improvement and is ambulating comfortably, advised to continue stretching exercises and should resolve uneventfully if it does not improve or worsens he should return for injection therapy      No follow-ups on file.

## 2023-11-12 ENCOUNTER — Other Ambulatory Visit: Payer: Self-pay

## 2023-11-14 MED ORDER — ATORVASTATIN CALCIUM 80 MG PO TABS: 40.0000 mg | ORAL_TABLET | Freq: Every day | ORAL | 0 refills | Status: DC

## 2023-11-16 ENCOUNTER — Ambulatory Visit (HOSPITAL_BASED_OUTPATIENT_CLINIC_OR_DEPARTMENT_OTHER): Admitting: Family

## 2023-11-28 NOTE — Progress Notes (Unsigned)
    SUBJECTIVE:   CHIEF COMPLAINT / HPI:   Here for f/u, saw cardiology 10/15/23.  Next appointment is on 7/11 They started farxiga  10mg  and advised carvedilol  BID, restarted lisinopril  Patient thinks he is taking carvedilol  twice a day, he is definitely taking his lisinopril . Reports that his blood pressure is always higher at the doctor.  He checked it this morning at home and it was 144/81.  He is asymptomatic.  Notes he has had some occasional left-sided chest tightness over the last week that he thinks may be residual from his rib fractures.  States this tightness is intermittent, there is no pattern, it lasts about a minute and then goes away.  He has not had this tightness in the last 2 days.  Also notes right lower back pain x 1 week.  States he has been bending over a lot recently to clean. No urinary sx or weakness  PERTINENT  PMH / PSH: Hypertension, portal vein thrombosis, HFmEF, hypothyroidism, chronic kidney disease 3B, hyperlipidemia  OBJECTIVE:   BP (!) 166/110   Pulse 87   Ht 5' 10 (1.778 m)   Wt 163 lb 12.8 oz (74.3 kg)   SpO2 99%   BMI 23.50 kg/m   General: well appearing, NAD Cardiovascular: RRR, no m/r/g Respiratory: normal work of breathing on RA, CTAB Abdomen: Normal bowel sounds, soft, non-tender Extremities: No swelling BLE  ASSESSMENT/PLAN:   Assessment & Plan Primary hypertension 2 blood pressures elevated at today's visit.  Unclear if patient is taking his medications as prescribed and he did not bring them with him today.  States that his blood pressures are normal when he is at home.  I am hesitant to adjust his medications today since I do not know that he is taking his prescribed medications for sure. Could consider ambulatory BP monitoring. - Scheduled with clinical pharmacist on Thursday for med rec, advised patient that he will need to bring in all of his medications this appointment - I will see him again in 2 to 3 weeks - Strict ED precautions  discussed Muscle strain Suspect strain to paraspinal muscles R lower back, states he has been bending over cleaning recently. Reassuringly tylenol  improves the pain. No urinary sx/focal weakness. - continue Tylenol  PRN for pain - heating pad - Stretch back and continue movement    [name], DO [facility]

## 2023-11-29 ENCOUNTER — Encounter: Payer: Self-pay | Admitting: Family Medicine

## 2023-11-29 ENCOUNTER — Ambulatory Visit (INDEPENDENT_AMBULATORY_CARE_PROVIDER_SITE_OTHER): Admitting: Family Medicine

## 2023-11-29 VITALS — BP 166/110 | HR 87 | Ht 70.0 in | Wt 163.8 lb

## 2023-11-29 DIAGNOSIS — T148XXA Other injury of unspecified body region, initial encounter: Secondary | ICD-10-CM | POA: Diagnosis not present

## 2023-11-29 DIAGNOSIS — I10 Essential (primary) hypertension: Secondary | ICD-10-CM

## 2023-11-29 MED ORDER — ATORVASTATIN CALCIUM 80 MG PO TABS: 40.0000 mg | ORAL_TABLET | Freq: Every day | ORAL | 3 refills | Status: DC

## 2023-11-29 NOTE — Patient Instructions (Addendum)
 Good to see you today - Thank you for coming in  Things we discussed today: - I have scheduled you with Dr. Koval on Thursday. PLEASE BRING ALL OF YOUR MEDICATIONS TO THAT APPOINTMENT.   Your goal blood pressure is less than 135/85  Check your blood pressure several times a week.  If regularly higher than this please let me know - either with MyChart or leaving a phone message. Next visit please bring in your blood pressure cuff or a written log of your blood pressures.  If you develop chest pain, shortness of breath, severe headache please go to the emergency room  You can take Tylenol  and apply heat to your muscle strain. If it persists longer than a week please let us  know.    Come back to see me in 2-3 weeks

## 2023-11-29 NOTE — Assessment & Plan Note (Addendum)
 2 blood pressures elevated at today's visit.  Unclear if patient is taking his medications as prescribed and he did not bring them with him today.  States that his blood pressures are normal when he is at home.  I am hesitant to adjust his medications today since I do not know that he is taking his prescribed medications for sure. Could consider ambulatory BP monitoring. - Scheduled with clinical pharmacist on Thursday for med rec, advised patient that he will need to bring in all of his medications this appointment - I will see him again in 2 to 3 weeks - Strict ED precautions discussed

## 2023-12-02 ENCOUNTER — Ambulatory Visit: Admitting: Pharmacist

## 2023-12-02 ENCOUNTER — Encounter: Payer: Self-pay | Admitting: Pharmacist

## 2023-12-02 VITALS — BP 179/106 | HR 79 | Wt 160.0 lb

## 2023-12-02 DIAGNOSIS — E785 Hyperlipidemia, unspecified: Secondary | ICD-10-CM

## 2023-12-02 DIAGNOSIS — I10 Essential (primary) hypertension: Secondary | ICD-10-CM

## 2023-12-02 MED ORDER — CARBOXYMETHYLCELLULOSE SODIUM 1 % OP SOLN: 1.0000 [drp] | Freq: Four times a day (QID) | OPHTHALMIC | 1 refills | Status: AC | PRN

## 2023-12-02 MED ORDER — ATORVASTATIN CALCIUM 80 MG PO TABS: 80.0000 mg | ORAL_TABLET | Freq: Every day | ORAL | 6 refills | Status: AC

## 2023-12-02 MED ORDER — POLYVINYL ALCOHOL 1.4 % OP SOLN: 1.0000 [drp] | Freq: Four times a day (QID) | OPHTHALMIC | 1 refills | Status: AC

## 2023-12-02 NOTE — Assessment & Plan Note (Signed)
 Hypertension diagnosed ~ 40 years ago and currently taking lisinopril  and carvedilol .  BP goal < 130/80 mmHg if he can tolerate. Medication adherence appears good all medications in his possession and has supply on-hand (except eye drops). Control is concerning for white-coat hypertension.   -Continue both lisinopril  and carvedilol  at current doses at this time -Patient educated on purpose, proper use, and potential adverse effects.  -Plan for Amb BP monitoring next week to evaluate blood pressure control more adequately outside the office.  -Counseled on lifestyle modifications for blood pressure control including reduced dietary sodium, increased exercise, adequate sleep.

## 2023-12-02 NOTE — Patient Instructions (Addendum)
 It was nice to see you today!  I look forward to seeing you for the Ambulatory Blood Monitor next week.   Your goal blood pressure is <140/90 mmHg and < 130/80 if you can tolerate it without dizziness.  Please let us  know if you feel dizzy frequently.   Medication Changes: - Change Atorvastatin  to 80mg  daily  (take full pill)  - Consider tylenol  8 hour (650mg  tablets) as an alternative to the extra-strength.  - Continue all other medication the same.  Keep up the good work with diet and exercise. Aim for a diet full of vegetables, fruit and lean meats (chicken, [place], fish). Try to limit salt intake by eating fresh or frozen vegetables (instead of canned), rinse canned vegetables prior to cooking and do not add any additional salt to meals.   Monitor blood pressure at home daily and keep a log (on your phone or piece of paper) to bring with you to your next visit. Write down date, time, blood pressure and pulse.   Please bring all medications to your clinic visits.  Please arrive 10-15 minutes prior to your scheduled visit time.

## 2023-12-02 NOTE — Assessment & Plan Note (Signed)
 Hyperlipidemia currently on statin, atorvastatin  40mg  (splitting 80mg  tablet), and most recent LDL 81.  - Increased dose of Atorvastatin  to 80mg  once daily.  Repeat Lipid Panel at future PCP visit in ~ 1 month.

## 2023-12-02 NOTE — Progress Notes (Signed)
   S:     Chief Complaint  Patient presents with   Medication Management    Medication Review and Hypertension therapy.    73 y.o. male who presents for medication review and hypertension evaluation, education, and management.   Patient was referred and last seen by Primary Care Provider, Dr. Stoney, on 11/29/2023.  At last visit, medication use was unclear and blood pressure medication change was deferred until medication review today.   PMH is significant for Hypertension since he was in his 30's.   Today, patient arrives in good spirits and presents without assistance. Denies dizziness, headache, blurred vision, swelling.   Family/Social history: lives alone, excited to attend family reunion in ~ 2 weeks  Medication adherence appears good . Patient  taken BP medications today.   Current antihypertensives include: Lisinopril  20mg  daily, and carvedilol  6.25mg  daily  Reported home BP readings: 138-144 / 80-83  Patient-reported exercise habits: enjoys walking (trying to walk more recently following rib fracture.    O:  Review of Systems  All other systems reviewed and are negative.   Physical Exam Vitals reviewed.   Neurological:     Mental Status: He is alert.     Last 3 Office BP readings: BP Readings from Last 3 Encounters:  12/02/23 (!) 179/106  11/29/23 (!) 166/110  10/15/23 (!) 152/98    BMET    Component Value Date/Time   NA 137 10/20/2023 1002   K 4.7 10/20/2023 1002   CL 102 10/20/2023 1002   CO2 19 (L) 10/20/2023 1002   GLUCOSE 104 (H) 10/20/2023 1002   GLUCOSE 104 (H) 09/10/2023 0829   BUN 30 (H) 10/20/2023 1002   CREATININE 2.10 (H) 10/20/2023 1002   CALCIUM  9.9 10/20/2023 1002   GFRNONAA 32 (L) 09/10/2023 0829   GFRAA 34 (L) 08/19/2015 1241    A/P: Hypertension diagnosed ~ 40 years ago and currently taking lisinopril  and carvedilol .  BP goal < 130/80 mmHg if he can tolerate. Medication adherence appears good all medications in his  possession and has supply on-hand (except eye drops). Control is concerning for white-coat hypertension.   -Continue both lisinopril  and carvedilol  at current doses at this time -Patient educated on purpose, proper use, and potential adverse effects.  -Plan for Amb BP monitoring next week to evaluate blood pressure control more adequately outside the office.  -Counseled on lifestyle modifications for blood pressure control including reduced dietary sodium, increased exercise, adequate sleep.  Hyperlipidemia currently on statin, atorvastatin  40mg  (splitting 80mg  tablet), and most recent LDL 81.  - Increased dose of Atorvastatin  to 80mg  once daily.  Repeat Lipid Panel at future PCP visit in ~ 1 month.   Results reviewed and written information provided.    Written patient instructions provided. Patient verbalized understanding of treatment plan.  Total time in face to face counseling 21 minutes.    Follow-up:  Pharmacist 12/07/2023. PCP clinic visit in 12/13/2023.

## 2023-12-06 NOTE — Progress Notes (Signed)
 Reviewed and agree with Dr Macky Lower plan.

## 2023-12-07 ENCOUNTER — Encounter: Payer: Self-pay | Admitting: Pharmacist

## 2023-12-07 ENCOUNTER — Ambulatory Visit: Admitting: Pharmacist

## 2023-12-07 VITALS — BP 159/105 | Wt 162.0 lb

## 2023-12-07 DIAGNOSIS — I10 Essential (primary) hypertension: Secondary | ICD-10-CM

## 2023-12-07 NOTE — Progress Notes (Signed)
   S:     Chief Complaint  Patient presents with   Medication Management    Amb Blood Pressure Monitor Day #1   73 y.o. male who presents for hypertension evaluation, education, and management. Patient arrives in good spirits and presents without any assistance.   Patient was referred and last seen by Primary Care Provider, Dr. Stoney, on 11/29/2023.  At visit last week, we did a medication review and all medications were updated.    PMH is significant for Hyperlipidemia, hypertension, CHF, Chronic Kidney Disease.   Diagnosed with Hypertension in 2013.    Medication compliance is reported to be good.  Patient reported home Blood Pressure results since last week ~  142/81 mm Hg.   Discussed procedure for wearing the monitor and gave patient written instructions. Monitor was placed on non-dominant arm with instructions to return in the morning.   Current BP Medications include:  Lisinopril  20mg  daily, carvedilol  6.25mg  BID  Antihypertensives tried in the past include: Chlorthalidone   O:  Review of Systems  All other systems reviewed and are negative.   Physical Exam Vitals reviewed.  Constitutional:      Appearance: Normal appearance.  Pulmonary:     Effort: Pulmonary effort is normal.   Neurological:     Mental Status: He is alert.   Psychiatric:        Mood and Affect: Mood normal.        Behavior: Behavior normal.        Thought Content: Thought content normal.        Judgment: Judgment normal.   Last 3 Office BP readings: BP Readings from Last 3 Encounters:  12/07/23 (!) 159/105  12/02/23 (!) 179/106  11/29/23 (!) 166/110   Basic Metabolic Panel    Component Value Date/Time   NA 137 10/20/2023 1002   K 4.7 10/20/2023 1002   CL 102 10/20/2023 1002   CO2 19 (L) 10/20/2023 1002   GLUCOSE 104 (H) 10/20/2023 1002   GLUCOSE 104 (H) 09/10/2023 0829   BUN 30 (H) 10/20/2023 1002   CREATININE 2.10 (H) 10/20/2023 1002   CALCIUM  9.9 10/20/2023 1002   GFRNONAA  32 (L) 09/10/2023 0829   GFRAA 34 (L) 08/19/2015 1241    ABPM Study Data: Arm Placement left arm   For Office Goal BP of <140/90 mmHg: (lower if tolerated) ABPM thresholds: Overall BP <130/80 mmHg, daytime BP <135/85 mmHg, sleeptime BP <120/70 mmHg    A/P: History of hypertension since 2013, currently taking carvedilol  and lisinopril .  Goal presssure of <140/90 mm Hg (lower if tolerated). Patient home readings, lower than office, suggest possible white coat hypertension.  -Placed blood pressure cuff, provided education, patient instructed to wear cuff for 24 hours and return tomorrow to review results.   Written patient instructions provided including activity/symptom/event log. Patient verbalized understanding of plan. Total time in face to face counseling 14 minutes.    Follow-up: Tomorrow AM - early morning appointment 8:30 AM

## 2023-12-07 NOTE — Patient Instructions (Signed)

## 2023-12-07 NOTE — Assessment & Plan Note (Signed)
 History of hypertension since 2013, currently taking carvedilol  and lisinopril .  Goal presssure of <140/90 mm Hg (lower if tolerated). Patient home readings, lower than office, suggest possible white coat hypertension.  -Placed blood pressure cuff, provided education, patient instructed to wear cuff for 24 hours and return tomorrow to review results.

## 2023-12-08 ENCOUNTER — Ambulatory Visit: Admitting: Pharmacist

## 2023-12-08 ENCOUNTER — Encounter: Payer: Self-pay | Admitting: Pharmacist

## 2023-12-08 VITALS — BP 139/87 | HR 85

## 2023-12-08 DIAGNOSIS — I10 Essential (primary) hypertension: Secondary | ICD-10-CM | POA: Diagnosis not present

## 2023-12-08 NOTE — Assessment & Plan Note (Signed)
 History of hypertension since 2013 and currently taking two drug regimen - carvedilol  and lisinopril .  Goal pressure in this 73 year old <140/90 mm Hg (lower if tolerated). Found to have normal blood pressure with with 24 hour ambulatory blood pressure evaluation which demonstrates an average AWAKE blood pressure of 139/87 mmHg (when all naps are removed from evaluation period). Nocturnal dipping pattern is significant with lowering of 20-25% when asleep.     Changes to medications - None -Continued Carvedilol  6.25mg  BID  -Continued Lisinopril  20 mg daily

## 2023-12-08 NOTE — Progress Notes (Signed)
 Reviewed and agree with Dr Macky Lower plan.

## 2023-12-08 NOTE — Patient Instructions (Signed)
 It was nice to see you today!  Your goal blood pressure is  <140/90 mmHg.  Medication Changes: - Continue all other medication the same.   Please bring all medications to your clinic visits.  Please arrive 10-15 minutes prior to your scheduled visit time.

## 2023-12-08 NOTE — Progress Notes (Signed)
   S:     Chief Complaint  Patient presents with   Medication Management    Amb Blood Pressure Monitor Day #2   73 y.o. male who presents for hypertension evaluation, education, and management.  Patient arrives in good spirits and presents without any assistance.   Patient returns to clinic with 24 hour blood pressure monitor and reports they were able to wear the ambulatory blood pressure cuff for the entire 24 evaluation period.  Patient states he did not sleep well while wearing monitor.  He reports a restful day at home.    O:  Review of Systems  All other systems reviewed and are negative.   Physical Exam Vitals reviewed.  Constitutional:      Appearance: Normal appearance.  Pulmonary:     Effort: Pulmonary effort is normal.  Neurological:     Mental Status: He is alert.  Psychiatric:        Mood and Affect: Mood normal.        Behavior: Behavior normal.        Thought Content: Thought content normal.        Judgment: Judgment normal.    Last 3 Office BP readings: BP Readings from Last 3 Encounters:  12/08/23 139/87  12/07/23 (!) 159/105  12/02/23 (!) 179/106   Basic Metabolic Panel    Component Value Date/Time   NA 137 10/20/2023 1002   K 4.7 10/20/2023 1002   CL 102 10/20/2023 1002   CO2 19 (L) 10/20/2023 1002   GLUCOSE 104 (H) 10/20/2023 1002   GLUCOSE 104 (H) 09/10/2023 0829   BUN 30 (H) 10/20/2023 1002   CREATININE 2.10 (H) 10/20/2023 1002   CALCIUM  9.9 10/20/2023 1002   GFRNONAA 32 (L) 09/10/2023 0829   GFRAA 34 (L) 08/19/2015 1241    ABPM Study Data: Arm Placement left arm  Overall Mean 24hr BP:   119/76 mmHg  HR: 84  Daytime Mean BP:  139/87 mmHg  HR: 86 (following removal of multiple naps)  Nighttime Mean BP:  105/70 mmHg  HR: 81  Dipping Pattern: Yes.    Sys:   24.3%   Dia: 20%  [normal dipping ~10-20%]    For Office Goal BP of <140/90 mmHg:  ABPM thresholds: Overall BP <130/80 mmHg, daytime BP <135/85 mmHg, sleeptime BP <120/70 mmHg    A/P: History of hypertension since 2013 and currently taking two drug regimen - carvedilol  and lisinopril .  Goal pressure in this 73 year old <140/90 mm Hg (lower if tolerated). Found to have normal blood pressure with with 24 hour ambulatory blood pressure evaluation which demonstrates an average AWAKE blood pressure of 139/87 mmHg (when all naps are removed from evaluation period). Nocturnal dipping pattern is significant with lowering of 20-25% when asleep.     Changes to medications - None -Continued Carvedilol  6.25mg  BID  -Continued Lisinopril  20 mg daily  Results reviewed and written information provided.    Written patient instructions provided. Patient verbalized understanding of treatment plan.  Total time in face to face counseling 26 minutes.    Follow-up:  Pharmacist PRN PCP clinic visit in 1 week.

## 2023-12-13 ENCOUNTER — Encounter: Payer: Self-pay | Admitting: Family Medicine

## 2023-12-13 ENCOUNTER — Ambulatory Visit: Payer: Self-pay | Admitting: Family Medicine

## 2023-12-13 VITALS — BP 140/84 | HR 99 | Ht 70.0 in | Wt 163.1 lb

## 2023-12-13 DIAGNOSIS — I10 Essential (primary) hypertension: Secondary | ICD-10-CM | POA: Diagnosis not present

## 2023-12-13 DIAGNOSIS — H2513 Age-related nuclear cataract, bilateral: Secondary | ICD-10-CM | POA: Diagnosis not present

## 2023-12-13 DIAGNOSIS — N1832 Chronic kidney disease, stage 3b: Secondary | ICD-10-CM | POA: Insufficient documentation

## 2023-12-13 DIAGNOSIS — H401124 Primary open-angle glaucoma, left eye, indeterminate stage: Secondary | ICD-10-CM | POA: Diagnosis not present

## 2023-12-13 NOTE — Patient Instructions (Signed)
 Good to see you today - Thank you for coming in  Things we discussed today: Your goal blood pressure is less than 140/90 Check your blood pressure several times a week.  If regularly higher than this please let me know - either with MyChart or leaving a phone message. Next visit please bring in your blood pressure cuff.      Please always bring your medication bottles  Come back to see me in 6 months

## 2023-12-13 NOTE — Assessment & Plan Note (Signed)
 Due to nocturnal dipping pattern on ambulatory monitoring will keep patient on current blood pressure medicine despite slightly elevated readings in office He is asymptomatic Follow-up in 6 months

## 2023-12-13 NOTE — Progress Notes (Signed)
    SUBJECTIVE:   CHIEF COMPLAINT / HPI:   Per note from Dr. Amalia 12/08/23:  History of hypertension since 2013 and currently taking two drug regimen - carvedilol  and lisinopril .  Goal pressure in this 73 year old <140/90 mm Hg (lower if tolerated). Found to have normal blood pressure with with 24 hour ambulatory blood pressure evaluation which demonstrates an average AWAKE blood pressure of 139/87 mmHg (when all naps are removed from evaluation period). Nocturnal dipping pattern is significant with lowering of 20-25% when asleep.     Changes to medications - None -Continued Carvedilol  6.25mg  BID  -Continued Lisinopril  20 mg daily     Here for follow-up of blood pressure.  Doing well.  Asymptomatic.  PERTINENT  PMH / PSH: HTN, HFmEF, portal vein thrombosis, hypothyroidism, CKD3b, HLD  OBJECTIVE:   BP (!) 140/84   Pulse 99   Ht 5' 10 (1.778 m)   Wt 163 lb 2 oz (74 kg)   BMI 23.41 kg/m   General: well appearing, NAD Cardiovascular: RRR, no m/r/g Respiratory: normal work of breathing on RA, CTAB Extremities: No swelling BLE  ASSESSMENT/PLAN:   Assessment & Plan Hypertension, unspecified type Due to nocturnal dipping pattern on ambulatory monitoring will keep patient on current blood pressure medicine despite slightly elevated readings in office He is asymptomatic Follow-up in 6 months    [name], DO [facility]

## 2023-12-17 ENCOUNTER — Encounter (HOSPITAL_BASED_OUTPATIENT_CLINIC_OR_DEPARTMENT_OTHER): Payer: Self-pay | Admitting: Family

## 2023-12-17 ENCOUNTER — Ambulatory Visit (HOSPITAL_BASED_OUTPATIENT_CLINIC_OR_DEPARTMENT_OTHER): Admitting: Family

## 2023-12-17 VITALS — BP 156/68 | HR 100 | Ht 70.0 in | Wt 164.4 lb

## 2023-12-17 DIAGNOSIS — I5022 Chronic systolic (congestive) heart failure: Secondary | ICD-10-CM | POA: Diagnosis not present

## 2023-12-17 DIAGNOSIS — D638 Anemia in other chronic diseases classified elsewhere: Secondary | ICD-10-CM | POA: Diagnosis not present

## 2023-12-17 DIAGNOSIS — I10 Essential (primary) hypertension: Secondary | ICD-10-CM

## 2023-12-17 DIAGNOSIS — N1832 Chronic kidney disease, stage 3b: Secondary | ICD-10-CM | POA: Diagnosis not present

## 2023-12-17 NOTE — Patient Instructions (Signed)
 Medication Instructions:   Your physician recommends that you continue on your current medications as directed. Please refer to the Current Medication list given to you today.   *If you need a refill on your cardiac medications before your next appointment, please call your pharmacy*  Lab Work:  TODAY!!!!/BMET/CBC/ITBIC/IRON  If you have labs (blood work) drawn today and your tests are completely normal, you will receive your results only by: MyChart Message (if you have MyChart) OR A paper copy in the mail If you have any lab test that is abnormal or we need to change your treatment, we will call you to review the results.  Testing/Procedures:  Your physician has requested that you have an echocardiogram. Echocardiography is a painless test that uses sound waves to create images of your heart. It provides your doctor with information about the size and shape of your heart and how well your heart's chambers and valves are working. This procedure takes approximately one hour. There are no restrictions for this procedure. Please do NOT wear cologne, aftershave, or lotions (deodorant is allowed). Please arrive 15 minutes prior to your appointment time.  Please note: We ask at that you not bring children with you during ultrasound (echo/ vascular) testing. Due to room size and safety concerns, children are not allowed in the ultrasound rooms during exams. Our front office staff cannot provide observation of children in our lobby area while testing is being conducted. An adult accompanying a patient to their appointment will only be allowed in the ultrasound room at the discretion of the ultrasound technician under special circumstances. We apologize for any inconvenience.   Follow-Up: At [facility], you and your health needs are our priority.  As part of our continuing mission to provide you with exceptional heart care, our providers are all part of one team.  This team includes your  primary Cardiologist (physician) and Advanced Practice Providers or APPs (Physician Assistants and Nurse Practitioners) who all work together to provide you with the care you need, when you need it.  Your next appointment:   1 month(s)  Provider:   Reche Finder, NP    We recommend signing up for the patient portal called MyChart.  Sign up information is provided on this After Visit Summary.  MyChart is used to connect with patients for Virtual Visits (Telemedicine).  Patients are able to view lab/test results, encounter notes, upcoming appointments, etc.  Non-urgent messages can be sent to your provider as well.   To learn more about what you can do with MyChart, go to [email or web address].

## 2023-12-17 NOTE — Progress Notes (Signed)
 Cardiology Office Note   Date:  12/17/2023  ID:  [name], DOB [birth year], MRN [id number] PCP: Stoney Blizzard, DO  [facility] Providers Cardiologist:  Shelda Bruckner, MD     History of Present Illness [name] is a 73 y.o. male with hx of HTN, unclear cardiomyopathy, CVA, CKD.  2022 LVEF 60-65%, 08/2023 LVEF 45-50%. Remote stroke at [facility] >20 years ago.  Established with Dr. Bruckner 10/15/2023.  Recent hospitalization for syncope. Diagnosis of hypertension 15 years prior which had been difficult to control. He was only taking Carvedilol  in the morning and Lisinopril  at night. No BMET recheck since resuming Lisinopril . Farxiga  10mg  daily initiated, Carvedilol  adjusted to BID. Plan to optimize medical therapy, repeat echo in 3 months prior to consideration of ischemic evaluation.   Presents today for follow up independently. Taking his medication around 8am and 9pm. BP at home 140/80. Notes mild chest tightness with more than usual activity like significant housework that goes away after a couple minutes rest. He has been walking for exercise and tries to get upa nd walk outside. Reports shortness of breath, orthopnea, PND, palpitations. Reports his lower extremity swelling is improved from prior. Notes waking up feeling tired and some generalized fatigue. He wakes up once at night to use the restroom and does fall back asleep easily. He had a sleep study a couple years ago at the [facility] which was unremarkable per his report.   ROS: Please see the history of present illness.    All other systems reviewed and are negative.   Studies Reviewed      Cardiac Studies & Procedures   ______________________________________________________________________________________________     ECHOCARDIOGRAM  ECHOCARDIOGRAM COMPLETE 09/06/2023  Narrative ECHOCARDIOGRAM REPORT    Patient Name:   [name] Date of Exam: 09/06/2023 Medical Rec #:  [id number]        Height:       70.0 in Accession #:    [id number]      Weight:       168.4 lb Date of Birth:  [birth year]       BSA:          1.940 m Patient Age:    73 years        BP:           112/78 mmHg Patient Gender: M               HR:           89 bpm. Exam Location:  Inpatient  Procedure: 2D Echo, 3D Echo, Cardiac Doppler, Color Doppler and Strain Analysis (Both Spectral and Color Flow Doppler were utilized during procedure).  Indications:    Syncope  History:        Patient has prior history of Echocardiogram examinations, most recent 01/05/2021. Risk Factors:Dyslipidemia and Current Smoker.  Sonographer:    Ozell Free Referring Phys: [id number] TODD D MCDIARMID   Sonographer Comments: Global longitudinal strain was attempted. IMPRESSIONS   1. Left ventricular ejection fraction, by estimation, is 45 to 50%. Left ventricular ejection fraction by 3D volume is 39 %. The left ventricle has mildly decreased function. The left ventricle has no regional wall motion abnormalities. Left ventricular diastolic parameters are consistent with Grade I diastolic dysfunction (impaired relaxation). The average left ventricular global longitudinal strain is -11.5 %. The global longitudinal strain is abnormal. 2. Right ventricular systolic function is normal. The right ventricular size is normal. Tricuspid regurgitation signal is inadequate for assessing  PA pressure. 3. The mitral valve is normal in structure. No evidence of mitral valve regurgitation. No evidence of mitral stenosis. 4. The aortic valve is grossly normal. Aortic valve regurgitation is not visualized. No aortic stenosis is present.  Comparison(s): Prior images reviewed side by side. The left ventricular function is worsened.  FINDINGS Left Ventricle: Left ventricular ejection fraction, by estimation, is 45 to 50%. Left ventricular ejection fraction by 3D volume is 39 %. The left ventricle has mildly decreased function. The left ventricle has no  regional wall motion abnormalities. The average left ventricular global longitudinal strain is -11.5 %. Strain was performed and the global longitudinal strain is abnormal. 3D ejection fraction reviewed and evaluated as part of the interpretation. Alternate measurement of EF is felt to be most reflective of LV function. The left ventricular internal cavity size was normal in size. There is no left ventricular hypertrophy. Left ventricular diastolic parameters are consistent with Grade I diastolic dysfunction (impaired relaxation). Normal left ventricular filling pressure.  Right Ventricle: The right ventricular size is normal. Right vetricular wall thickness was not well visualized. Right ventricular systolic function is normal. Tricuspid regurgitation signal is inadequate for assessing PA pressure.  Left Atrium: Left atrial size was normal in size.  Right Atrium: Right atrial size was normal in size. Prominent Eustachian valve.  Pericardium: There is no evidence of pericardial effusion.  Mitral Valve: The mitral valve is normal in structure. No evidence of mitral valve regurgitation. No evidence of mitral valve stenosis.  Tricuspid Valve: The tricuspid valve is normal in structure. Tricuspid valve regurgitation is not demonstrated.  Aortic Valve: The aortic valve is grossly normal. Aortic valve regurgitation is not visualized. No aortic stenosis is present. Aortic valve mean gradient measures 2.0 mmHg. Aortic valve peak gradient measures 3.4 mmHg. Aortic valve area, by VTI measures 2.35 cm.  Pulmonic Valve: The pulmonic valve was not well visualized.  Aorta: The aortic root is normal in size and structure.  IAS/Shunts: No atrial level shunt detected by color flow Doppler.  Additional Comments: 3D was performed not requiring image post processing on an independent workstation and was abnormal.   LEFT VENTRICLE PLAX 2D LVIDd:         4.80 cm         Diastology LVIDs:         3.70 cm          LV e' medial:    6.74 cm/s LV PW:         1.00 cm         LV E/e' medial:  8.4 LV IVS:        1.10 cm         LV e' lateral:   9.25 cm/s LVOT diam:     2.00 cm         LV E/e' lateral: 6.1 LV SV:         36 LV SV Index:   19              2D Longitudinal LVOT Area:     3.14 cm        Strain 2D Strain GLS   -11.5 % Avg: LV Volumes (MOD) LV vol d, MOD    45.3 ml       3D Volume EF A2C:                           LV 3D EF:  Left LV vol d, MOD    70.5 ml                    ventricul A4C:                                        ar LV vol s, MOD    27.7 ml                    ejection A2C:                                        fraction LV vol s, MOD    37.4 ml                    by 3D A4C:                                        volume is LV SV MOD A2C:   17.6 ml                    39 %. LV SV MOD A4C:   70.5 ml LV SV MOD BP:    24.9 ml 3D Volume EF: 3D EF:        39 % LV EDV:       85 ml LV ESV:       51 ml LV SV:        33 ml  RIGHT VENTRICLE RV Basal diam:  3.60 cm RV S prime:     15.10 cm/s TAPSE (M-mode): 2.4 cm  LEFT ATRIUM             Index        RIGHT ATRIUM           Index LA diam:        3.00 cm 1.55 cm/m   RA Area:     10.20 cm LA Vol (A2C):   30.5 ml 15.72 ml/m  RA Volume:   18.30 ml  9.43 ml/m LA Vol (A4C):   31.1 ml 16.03 ml/m LA Biplane Vol: 32.4 ml 16.70 ml/m AORTIC VALVE AV Area (Vmax):    2.41 cm AV Area (Vmean):   2.24 cm AV Area (VTI):     2.35 cm AV Vmax:           92.80 cm/s AV Vmean:          64.200 cm/s AV VTI:            0.155 m AV Peak Grad:      3.4 mmHg AV Mean Grad:      2.0 mmHg LVOT Vmax:         71.20 cm/s LVOT Vmean:        45.800 cm/s LVOT VTI:          0.116 m LVOT/AV VTI ratio: 0.75  AORTA Ao Root diam: 3.40 cm  MITRAL VALVE MV Area (PHT): 5.13 cm     SHUNTS MV Decel Time: 148 msec     Systemic VTI:  0.12 m MV E velocity: 56.30 cm/s   Systemic Diam: 2.00 cm MV A velocity: 120.00 cm/s MV E/A  ratio:  0.47  Mihai  Croitoru MD Electronically signed by Jerel Balding MD Signature Date/Time: 09/06/2023/5:16:58 PM    Final          ______________________________________________________________________________________________      Risk Assessment/Calculations   HYPERTENSION CONTROL Vitals:   12/17/23 1356 12/17/23 1444  BP: (!) 187/109 (!) 156/68    The patient's blood pressure is elevated above target today.  In order to address the patient's elevated BP: Blood pressure will be monitored at home to determine if medication changes need to be made.          Physical Exam VS:  BP (!) 156/68   Pulse 100   Ht 5' 10 (1.778 m)   Wt 164 lb 6.4 oz (74.6 kg)   SpO2 95%   BMI 23.59 kg/m        Wt Readings from Last 3 Encounters:  12/17/23 164 lb 6.4 oz (74.6 kg)  12/13/23 163 lb 2 oz (74 kg)  12/07/23 162 lb (73.5 kg)    GEN: Well nourished, well developed in no acute distress NECK: No JVD; No carotid bruits CARDIAC: RRR, no murmurs, rubs, gallops RESPIRATORY:  Clear to auscultation without rales, wheezing or rhonchi  ABDOMEN: Soft, non-tender, non-distended EXTREMITIES:  No edema; No deformity   ASSESSMENT AND PLAN   HFmrEF / CKDIIIb / LE edema / DOE - LE edema improved. Euvolemic on exam. However, persistent fatigue. Update CBC, BMET, anemia panel.  Update echo to reassess LVEF. If not improved, plan for ischemic evaluation likely with PET due to renal function.  Low sodium diet, fluid restriction <2L, and daily weights encouraged. Educated to contact our office for weight gain of 2 lbs overnight or 5 lbs in one week.   HTN - BP markedly elvated today initially 187/109 with repeat 156/68 without intervention. Reports home BP 140s. Per PCP note 12/13/23 recent 24h monitor with awake BP 139/87 on average with nocturnal dipping BP lowering 20-25%. BP goal <130/80. Await lab results, pending renal function consider transition ACE to ARB. If unable to switch to ARB, plan to increase  Carvedilol  dose.   CKD / Anemia (likely of chronic disease) - Update BMET, CBC, anemia panel.        Dispo: follow up after echocardiogram  Signed, Reche GORMAN Finder, NP

## 2023-12-18 ENCOUNTER — Ambulatory Visit (HOSPITAL_BASED_OUTPATIENT_CLINIC_OR_DEPARTMENT_OTHER): Payer: Self-pay | Admitting: Family

## 2023-12-18 LAB — CBC
Hematocrit: 39 % (ref 37.5–51.0)
Hemoglobin: 11.8 g/dL — ABNORMAL LOW (ref 13.0–17.7)
MCH: 24 pg — ABNORMAL LOW (ref 26.6–33.0)
MCHC: 30.3 g/dL — ABNORMAL LOW (ref 31.5–35.7)
MCV: 79 fL (ref 79–97)
Platelets: 242 x10E3/uL (ref 150–450)
RBC: 4.91 x10E6/uL (ref 4.14–5.80)
RDW: 14.2 % (ref 11.6–15.4)
WBC: 7.1 x10E3/uL (ref 3.4–10.8)

## 2023-12-18 LAB — IRON AND TIBC
Iron Saturation: 17 % (ref 15–55)
Iron: 54 ug/dL (ref 38–169)
Total Iron Binding Capacity: 315 ug/dL (ref 250–450)
UIBC: 261 ug/dL (ref 111–343)

## 2023-12-18 LAB — BASIC METABOLIC PANEL WITH GFR
BUN/Creatinine Ratio: 15 (ref 10–24)
BUN: 28 mg/dL — ABNORMAL HIGH (ref 8–27)
CO2: 19 mmol/L — ABNORMAL LOW (ref 20–29)
Calcium: 9.2 mg/dL (ref 8.6–10.2)
Chloride: 105 mmol/L (ref 96–106)
Creatinine, Ser: 1.91 mg/dL — ABNORMAL HIGH (ref 0.76–1.27)
Glucose: 75 mg/dL (ref 70–99)
Potassium: 4.5 mmol/L (ref 3.5–5.2)
Sodium: 140 mmol/L (ref 134–144)
eGFR: 37 mL/min/1.73 — ABNORMAL LOW (ref >59)

## 2023-12-18 LAB — VITAMIN B12: Vitamin B-12: 598 pg/mL (ref 232–1245)

## 2023-12-18 LAB — FERRITIN: Ferritin: 300 ng/mL (ref 30–400)

## 2023-12-18 LAB — FOLATE: Folate: 8.2 ng/mL (ref >3.0)

## 2023-12-20 ENCOUNTER — Other Ambulatory Visit (HOSPITAL_BASED_OUTPATIENT_CLINIC_OR_DEPARTMENT_OTHER): Payer: Self-pay | Admitting: *Deleted

## 2023-12-20 MED ORDER — CARVEDILOL 12.5 MG PO TABS: 12.5000 mg | ORAL_TABLET | Freq: Two times a day (BID) | ORAL | 3 refills | Status: AC

## 2023-12-30 ENCOUNTER — Encounter: Payer: Self-pay | Admitting: Family Medicine

## 2023-12-30 DIAGNOSIS — H401120 Primary open-angle glaucoma, left eye, stage unspecified: Secondary | ICD-10-CM | POA: Insufficient documentation

## 2024-01-06 ENCOUNTER — Ambulatory Visit (INDEPENDENT_AMBULATORY_CARE_PROVIDER_SITE_OTHER): Admitting: Podiatry

## 2024-01-06 DIAGNOSIS — M76822 Posterior tibial tendinitis, left leg: Secondary | ICD-10-CM

## 2024-01-06 DIAGNOSIS — M722 Plantar fascial fibromatosis: Secondary | ICD-10-CM | POA: Diagnosis not present

## 2024-01-06 NOTE — Progress Notes (Signed)
  Subjective:  Patient ID: [name], male    DOB: 1951-04-09,  MRN: [id number]  Chief Complaint  Patient presents with   Follow-up    f/u Plantar fasciitis and posterior tibial tendonitis left. 100% better. Non diabetic. Pt. Doing exercises.    Discussed the use of AI scribe software for clinical note transcription with the patient, who gave verbal consent to proceed.  History of Present Illness [name] is a 73 year old male who returns for follow-up doing much better      Objective:    Physical Exam VASCULAR: DP and PT pulse palpable. Foot is warm and well-perfused. Capillary fill time is brisk. 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 Normal skin turgor, texture, and temperature. No open lesions, rashes, or ulcerations. NEUROLOGIC: Normal sensation to light touch and pressure. No paresthesias on examination. ORTHOPEDIC: Today minimal pain evaluation in plantar arch plantar fascia or posterior tibial tendon good level 5 strength in all planes    Results RADIOLOGY Left foot radiographs: Pes planus deformity, mild arthritic changes in the midfoot, (08/31/2023)   Assessment:   1. Plantar fasciitis, left   2. Posterior tibial tendon dysfunction (PTTD) of left lower extremity      Plan:  Patient was evaluated and treated and all questions answered.  Assessment and Plan Assessment & Plan Plantar fasciitis and posterior tibial tendonitis Doing much better and has essentially resolved at this point.  I did recommend he continue home therapy 2 times a week to prevent recurrence.  We discussed the possibility of recurrence and if this happens he will return to see me as needed.      No follow-ups on file.

## 2024-01-21 ENCOUNTER — Other Ambulatory Visit (HOSPITAL_BASED_OUTPATIENT_CLINIC_OR_DEPARTMENT_OTHER)

## 2024-01-24 ENCOUNTER — Ambulatory Visit (HOSPITAL_BASED_OUTPATIENT_CLINIC_OR_DEPARTMENT_OTHER): Admitting: Family

## 2024-02-04 ENCOUNTER — Ambulatory Visit (INDEPENDENT_AMBULATORY_CARE_PROVIDER_SITE_OTHER)

## 2024-02-04 DIAGNOSIS — I10 Essential (primary) hypertension: Secondary | ICD-10-CM | POA: Diagnosis not present

## 2024-02-04 DIAGNOSIS — D638 Anemia in other chronic diseases classified elsewhere: Secondary | ICD-10-CM | POA: Diagnosis not present

## 2024-02-04 DIAGNOSIS — N1832 Chronic kidney disease, stage 3b: Secondary | ICD-10-CM | POA: Diagnosis not present

## 2024-02-04 DIAGNOSIS — I5022 Chronic systolic (congestive) heart failure: Secondary | ICD-10-CM

## 2024-02-04 LAB — ECHOCARDIOGRAM COMPLETE
AR max vel: 1.9 cm2
AV Area VTI: 1.85 cm2
AV Area mean vel: 1.83 cm2
AV Mean grad: 3 mmHg
AV Peak grad: 5.1 mmHg
AV Vena cont: 0.31 cm
Ao pk vel: 1.13 m/s
Area-P 1/2: 2.83 cm2
Calc EF: 53.2 %
P 1/2 time: 670 ms
S' Lateral: 2.88 cm
Single Plane A2C EF: 56.5 %
Single Plane A4C EF: 49.6 %

## 2024-02-08 ENCOUNTER — Telehealth: Payer: Self-pay

## 2024-02-08 NOTE — Telephone Encounter (Signed)
 Patient calls nurse line regarding issues with picking up Carvedilol .   Patient was increased to 12.5 mg BID by cardiologist on 7/14. Patient reports that he has been taking 2, 6.25 mg tablets BID.   Called pharmacy. They were attempting to fill the 6.25 mg dosage, therefore, they were receiving error that medication was too soon to fill.   Advised of dosage change and medication went through insurance. They will have rx ready for him later today.   Called patient and advised of update. Also clarified to patient that he would only be taking 1 tablet of the new 12.5 mg dosage BID.   Patient voices understanding.   Chiquita JAYSON English, RN

## 2024-02-16 DIAGNOSIS — H401124 Primary open-angle glaucoma, left eye, indeterminate stage: Secondary | ICD-10-CM | POA: Diagnosis not present

## 2024-03-08 ENCOUNTER — Ambulatory Visit
Admission: RE | Admit: 2024-03-08 | Discharge: 2024-03-08 | Disposition: A | Discharge status: Home / Self Care | Source: Ambulatory Visit | Attending: Family Medicine | Admitting: Family Medicine

## 2024-03-08 ENCOUNTER — Ambulatory Visit (INDEPENDENT_AMBULATORY_CARE_PROVIDER_SITE_OTHER): Admitting: Family Medicine

## 2024-03-08 ENCOUNTER — Encounter: Payer: Self-pay | Admitting: Family Medicine

## 2024-03-08 VITALS — BP 149/101 | HR 82 | Ht 70.0 in | Wt 164.6 lb

## 2024-03-08 DIAGNOSIS — M25511 Pain in right shoulder: Secondary | ICD-10-CM

## 2024-03-08 DIAGNOSIS — I10 Essential (primary) hypertension: Secondary | ICD-10-CM

## 2024-03-08 DIAGNOSIS — M19011 Primary osteoarthritis, right shoulder: Secondary | ICD-10-CM | POA: Diagnosis not present

## 2024-03-08 DIAGNOSIS — Z23 Encounter for immunization: Secondary | ICD-10-CM

## 2024-03-08 DIAGNOSIS — Z8673 Personal history of transient ischemic attack (TIA), and cerebral infarction without residual deficits: Secondary | ICD-10-CM | POA: Insufficient documentation

## 2024-03-08 MED ORDER — LISINOPRIL 20 MG PO TABS: 20.0000 mg | ORAL_TABLET | Freq: Every day | ORAL | 3 refills | Status: AC

## 2024-03-08 MED ORDER — DAPAGLIFLOZIN PROPANEDIOL 10 MG PO TABS: 10.0000 mg | ORAL_TABLET | Freq: Every day | ORAL | 11 refills | Status: AC

## 2024-03-08 MED ORDER — VITAMIN D3 25 MCG (1000 UNIT) PO TABS: 1000.0000 [IU] | ORAL_TABLET | Freq: Every day | ORAL | 3 refills | Status: AC

## 2024-03-08 MED ORDER — FAMOTIDINE 20 MG PO TABS: 20.0000 mg | ORAL_TABLET | Freq: Every day | ORAL | 3 refills | Status: AC

## 2024-03-08 NOTE — Progress Notes (Signed)
    SUBJECTIVE:   CHIEF COMPLAINT / HPI:   R shoulder pain Pain started: 1 month ago Pain prevents: difficulty buttoning shirt, lifting arm, laying on right side Medications tried: tylenol , lidocaine  patches Recent trauma: was moving boxes 1 month ago and maybe felt a pop, pain with movement Similar pain previously: no  PERTINENT  PMH / PSH: HTN, portal vein thrombosis, HFmEF, hx CVA, CKD3b, HLD  OBJECTIVE:   BP (!) 149/101   Pulse 82   Ht 5' 10 (1.778 m)   Wt 164 lb 9.6 oz (74.7 kg)   SpO2 100%   BMI 23.62 kg/m    R shoulder: No swelling, ecchymoses.  No gross deformity.  Tenderness to palpation of the anterior shoulder, over AC joint.  Full range of motion but with pain of abduction, flexion 120 degrees. + Hawkins, negative Neers. Pain with empty can but strength 5/5. Pain with resisted external rotation but strength 5/5. Resisted internal rotation 5/5 strength. NVI.   ASSESSMENT/PLAN:   Assessment & Plan Acute pain of right shoulder Suspect possible supraspinatus tear vs AC joint impingement Continue current pain regimen, no NSAIDs Referred to sports medicine Referred to physical therapy Will obtain shoulder XR to r/o fracture Encounter for immunization Received flu shot today Primary hypertension BP elevated today, 149/101 on recheck while standing. Continue current medication regimen, mainly looking at his systolic number. Hesitant to adjust meds due to nocturnal dipping pattern on last ambulatory BP monitoring and hx of falls.     [name], DO [facility] Center

## 2024-03-08 NOTE — Patient Instructions (Signed)
 Good to see you today - Thank you for coming in  Things we discussed today:  I have sent a referral to sports medicine and physical therapy for your shoulder Continue moving it is much as possible, and taking Tylenol  for pain  I have ordered x-rays of your shoulder, you can go to [address] at any time to have these completed

## 2024-03-08 NOTE — Assessment & Plan Note (Signed)
 BP elevated today, 149/101 on recheck while standing. Continue current medication regimen, mainly looking at his systolic number. Hesitant to adjust meds due to nocturnal dipping pattern on last ambulatory BP monitoring and hx of falls.

## 2024-03-11 ENCOUNTER — Ambulatory Visit: Payer: Self-pay | Admitting: Family Medicine

## 2024-03-16 ENCOUNTER — Ambulatory Visit (INDEPENDENT_AMBULATORY_CARE_PROVIDER_SITE_OTHER)

## 2024-03-16 ENCOUNTER — Other Ambulatory Visit: Payer: Self-pay

## 2024-03-16 VITALS — BP 171/91 | Ht 70.0 in | Wt 170.0 lb

## 2024-03-16 DIAGNOSIS — M19011 Primary osteoarthritis, right shoulder: Secondary | ICD-10-CM | POA: Diagnosis not present

## 2024-03-16 DIAGNOSIS — M75101 Unspecified rotator cuff tear or rupture of right shoulder, not specified as traumatic: Secondary | ICD-10-CM | POA: Diagnosis not present

## 2024-03-16 DIAGNOSIS — M25511 Pain in right shoulder: Secondary | ICD-10-CM

## 2024-03-16 DIAGNOSIS — M7521 Bicipital tendinitis, right shoulder: Secondary | ICD-10-CM | POA: Diagnosis not present

## 2024-03-16 NOTE — Progress Notes (Signed)
 PCP: Stoney Blizzard, DO  Subjective:   HPI: Patient is a 73 y.o. male here for evaluation of right shoulder pain and being referred to us  by his PCP.  Patient states he was moving boxes 3-4 weeks ago when he felt a pop in his right shoulder and had pain with shoulder movements thereafter.  Since then he endorses persistence of pain with difficulty doing overhead activities, sleeping on right side as well as ADLs including putting on shirts etc. Saw his PCP for evaluation on 03/08/2024 who ordered right shoulder x-ray showing mild degenerative changes and glenohumeral and AC joint.  Has been taking Tylenol  and lidocaine  patches as needed for pain.  These medications have been working well and he states pain is manageable using this analgesic protocol.  Was not allowed to take ibuprofen secondary to kidney disease.  Here today for further evaluation regarding right shoulder injury and continued difficulty with overhead activities.  Past Medical History:  Diagnosis Date   Acute renal failure superimposed on stage 3b chronic kidney disease (HCC) 09/07/2023   Chronic kidney disease, stage 3 (HCC)    Chronic pancreatitis (HCC)    Colon polyps    GERD (gastroesophageal reflux disease)    Hyperlipidemia    Hypertension    Hypothyroidism    Portal vein thrombosis    previously on Coumadin, no longer requiring anticoag   Prediabetes    Rib fractures 09/05/2023   Stroke (HCC)    2004, L sided weakness (no residual)   Syncope 01/04/2021   Vitamin D  deficiency     Current Outpatient Medications on File Prior to Visit  Medication Sig Dispense Refill   acetaminophen  (TYLENOL ) 500 MG tablet Take 1,000 mg by mouth daily as needed.     Artificial Tears ophthalmic solution Place 1 drop into both eyes 4 (four) times daily. 15 mL 1   atorvastatin  (LIPITOR) 80 MG tablet Take 1 tablet (80 mg total) by mouth daily. 30 tablet 6   calcium  carbonate (TUMS - DOSED IN MG ELEMENTAL CALCIUM ) 500 MG chewable  tablet Chew 1 tablet by mouth daily.     carboxymethylcellulose 1 % ophthalmic solution Apply 1 drop to eye 4 (four) times daily as needed (for eye irritation). 30 mL 1   carvedilol  (COREG ) 12.5 MG tablet Take 1 tablet (12.5 mg total) by mouth 2 (two) times daily with a meal. 180 tablet 3   cetirizine  (ZYRTEC ) 10 MG tablet Take 1 tablet (10 mg total) by mouth daily as needed for allergies. 90 tablet 1   cholecalciferol  (VITAMIN D3) 25 MCG (1000 UNIT) tablet Take 1 tablet (1,000 Units total) by mouth daily. 90 tablet 3   dapagliflozin  propanediol (FARXIGA ) 10 MG TABS tablet Take 1 tablet (10 mg total) by mouth daily before breakfast. 30 tablet 11   famotidine  (PEPCID ) 20 MG tablet Take 1 tablet (20 mg total) by mouth daily. 90 tablet 3   latanoprost (XALATAN) 0.005 % ophthalmic solution Apply 1 drop to eye.     levothyroxine  (SYNTHROID ) 75 MCG tablet Take 1 tablet (75 mcg total) by mouth daily before breakfast. 90 tablet 3   lisinopril  (ZESTRIL ) 20 MG tablet Take 1 tablet (20 mg total) by mouth at bedtime. 90 tablet 3   No current facility-administered medications on file prior to visit.    Past Surgical History:  Procedure Laterality Date   CHOLECYSTECTOMY     NECK SURGERY     bone spurs   SHOULDER ARTHROSCOPY Right 12/07/2013   THYROIDECTOMY  Allergies  Allergen Reactions   Aspirin Other (See Comments)    Stomach upset   Aspirin-Acetaminophen -Caffeine Nausea Only and Other (See Comments)    Other reaction(s): Hyperactive behavior, Nausea   Hydrocodone  Other (See Comments)    Dizziness, bad dreams   Chlorthalidone Diarrhea and Other (See Comments)    Low blood pressure    BP (!) 171/91   Ht 5' 10 (1.778 m)   Wt 170 lb (77.1 kg)   BMI 24.39 kg/m       No data to display              No data to display              Objective:  Physical Exam:  Gen: NAD, comfortable in exam room  On inspection of left shoulder no evidence of erythema, ecchymosis, or edema  present.  Denies tenderness to palpation over bony landmarks of shoulder including but not limited to [facility] joint, acromion.  Does have some pain along proximal bicipital groove of humerus.  Patient has full passive range of motion of shoulder in extension, adduction, internal and external rotation.  Limited active range of motion with abduction and shoulder flexion secondary to pain..  Rotator cuff strength testing is 5/5 bilaterally in all planes of motion with the exception of resisted abduction, strength 4/5.  Neurovascularly intact distally.  Empty can testing positive for pain negative for gross weakness.  Negative drop arm test.  O'Brien's negative.  Hawkins mildly positive.  Neer's negative.  Apprehension test negative.  Cross body adduction testing negative.  Speeds positive.  Yergason's positive.   MS[facility] Complete US  of Shoulder, Right  Patient was seated on exam table and shoulder US  examination was performed using high frequency linear probe. The biceps tendon was visualized within the bicipital groove in both longitudinal and transverse axis with evidence of fluid and hypoechoic changes, but tendon still intact in long and short axis views.  Pectoralis major tendon intact without abnormalities.  The subscapularis tendon was visualized in longitudinal axis with intact tendon inserting at the inferior lesser tubercle of the humerus. No signs of tear, no hypoechoic changes or tissue irregularity were seen. The supraspinatus tendon was visualized in longitudinal, transverse, and dynamic views.  Evidence of high-grade partial tearing with the tendon inserting at the superior facet of the greater tubercle of the humerus;hypoechoic changes and tissue irregularity present. The infraspinatus and teres minor tendons were visualized in both longitudinal and transverse axis with tendon insertion at the middle facet of the great tubercle of the humerus with no signs of tearing, hypoechoic changes seen.  Some mild  chronic tissue irregularity consistent with tendinosis seen. The posterior glenohumeral joint was visualized with proper alignment. [facility] Joint was visualized without bursal distension or significant bony spurs/arthritic changes; joint space narrowing present.  IMPRESSION: Complete ultrasound of right shoulder showing evidence of high-grade partial-thickness tearing of supraspinatus as well as bicipital tenosynovitis with surrounding fluid.    Assessment & Plan:   #Acute injury of right shoulder #High-grade partial-thickness tear of right supraspinatus #Tenosynovitis of long head of biceps tendon -Physical exam, history, and ultrasound evaluation all consistent with diagnoses listed above. -Patient wishes to avoid surgery at this time; would like to proceed with rehab and conservative management -Feels like pain is well managed using Tylenol  and lidocaine  patches.  Denies need for corticosteroid injection and is not able to take NSAIDs secondary to kidney disease -Referral sent to physical therapy for rehab regarding right bicipital  tendinitis, supraspinatus partial-thickness tearing, and mild right shoulder osteoarthritis -We will follow-up in 4 to 6 weeks for reevaluation in our clinic.  If not progressing adequately with recovery may consider MRI and/or nitroglycerin patches - Patient understands and agrees to treatment plan.  No further questions or concerns at this time.

## 2024-03-27 ENCOUNTER — Ambulatory Visit (HOSPITAL_BASED_OUTPATIENT_CLINIC_OR_DEPARTMENT_OTHER): Admitting: Family

## 2024-03-30 ENCOUNTER — Ambulatory Visit

## 2024-03-30 VITALS — Ht 70.0 in | Wt 169.0 lb

## 2024-03-30 DIAGNOSIS — Z0000 Encounter for general adult medical examination without abnormal findings: Secondary | ICD-10-CM

## 2024-03-30 NOTE — Progress Notes (Signed)
 Because this visit was a virtual/telehealth visit,  certain criteria was not obtained, such a blood pressure, CBG if applicable, and timed get up and go. Any medications not marked as taking were not mentioned during the medication reconciliation part of the visit. Any vitals not documented were not able to be obtained due to this being a telehealth visit or patient was unable to self-report a recent blood pressure reading due to a lack of equipment at home via telehealth. Vitals that have been documented are verbally provided by the patient.   Subjective:   [name] is a 73 y.o. who presents for a Medicare Wellness preventive visit.  As a reminder, Annual Wellness Visits don't include a physical exam, and some assessments may be limited, especially if this visit is performed virtually. We may recommend an in-person follow-up visit with your provider if needed.  Visit Complete: Virtual I connected with  [name] on 03/30/24 by a audio enabled telemedicine application and verified that I am speaking with the correct person using two identifiers.  Patient Location: Home  Provider Location: Home Office  I discussed the limitations of evaluation and management by telemedicine. The patient expressed understanding and agreed to proceed.  Vital Signs: Because this visit was a virtual/telehealth visit, some criteria may be missing or patient reported. Any vitals not documented were not able to be obtained and vitals that have been documented are patient reported.  VideoDeclined- This patient declined Librarian, academic. Therefore the visit was completed with audio only.  Persons Participating in Visit: Patient.  AWV Questionnaire: No: Patient Medicare AWV questionnaire was not completed prior to this visit.  Cardiac Risk Factors include: advanced age (>50men, >51 women);dyslipidemia;hypertension;male gender     Objective:    Today's Vitals    03/30/24 1620  Weight: 169 lb (76.7 kg)  Height: 5' 10 (1.778 m)  PainSc: 7   PainLoc: Shoulder   Body mass index is 24.25 kg/m.     03/30/2024    4:23 PM 03/08/2024   10:26 AM 12/13/2023    2:21 PM 11/29/2023    1:54 PM 07/14/2023   10:58 AM 01/04/2021    8:08 PM 02/22/2014    7:52 AM  Advanced Directives  Does Patient Have a Medical Advance Directive? No No No No No No No   Would patient like information on creating a medical advance directive? No - Patient declined No - Patient declined No - Patient declined No - Patient declined No - Patient declined No - Patient declined No - patient declined information      Data saved with a previous flowsheet row definition    Current Medications (verified) Outpatient Encounter Medications as of 03/30/2024  Medication Sig   acetaminophen  (TYLENOL ) 500 MG tablet Take 1,000 mg by mouth daily as needed.   Artificial Tears ophthalmic solution Place 1 drop into both eyes 4 (four) times daily.   atorvastatin  (LIPITOR) 80 MG tablet Take 1 tablet (80 mg total) by mouth daily.   calcium  carbonate (TUMS - DOSED IN MG ELEMENTAL CALCIUM ) 500 MG chewable tablet Chew 1 tablet by mouth daily.   carboxymethylcellulose 1 % ophthalmic solution Apply 1 drop to eye 4 (four) times daily as needed (for eye irritation).   carvedilol  (COREG ) 12.5 MG tablet Take 1 tablet (12.5 mg total) by mouth 2 (two) times daily with a meal.   cetirizine  (ZYRTEC ) 10 MG tablet Take 1 tablet (10 mg total) by mouth daily as needed for  allergies.   cholecalciferol  (VITAMIN D3) 25 MCG (1000 UNIT) tablet Take 1 tablet (1,000 Units total) by mouth daily.   dapagliflozin  propanediol (FARXIGA ) 10 MG TABS tablet Take 1 tablet (10 mg total) by mouth daily before breakfast.   famotidine  (PEPCID ) 20 MG tablet Take 1 tablet (20 mg total) by mouth daily.   latanoprost (XALATAN) 0.005 % ophthalmic solution Apply 1 drop to eye.   levothyroxine  (SYNTHROID ) 75 MCG tablet Take 1 tablet (75 mcg total)  by mouth daily before breakfast.   lisinopril  (ZESTRIL ) 20 MG tablet Take 1 tablet (20 mg total) by mouth at bedtime.   No facility-administered encounter medications on file as of 03/30/2024.    Allergies (verified) Aspirin, Aspirin-acetaminophen -caffeine, Hydrocodone , and Chlorthalidone   History: Past Medical History:  Diagnosis Date   Acute renal failure superimposed on stage 3b chronic kidney disease (HCC) 09/07/2023   Chronic kidney disease, stage 3 (HCC)    Chronic pancreatitis (HCC)    Colon polyps    GERD (gastroesophageal reflux disease)    Hyperlipidemia    Hypertension    Hypothyroidism    Portal vein thrombosis    previously on Coumadin, no longer requiring anticoag   Prediabetes    Rib fractures 09/05/2023   Stroke (HCC)    2004, L sided weakness (no residual)   Syncope 01/04/2021   Vitamin D  deficiency    Past Surgical History:  Procedure Laterality Date   CHOLECYSTECTOMY     NECK SURGERY     bone spurs   SHOULDER ARTHROSCOPY Right 12/07/2013   THYROIDECTOMY     Family History  Problem Relation Age of Onset   Colon cancer Mother    Colon cancer Sister    Colon cancer Brother    Social History   Socioeconomic History   Marital status: Single    Spouse name: Not on file   Number of children: Not on file   Years of education: Not on file   Highest education level: Not on file  Occupational History   Not on file  Tobacco Use   Smoking status: Former    Current packs/day: 0.00    Types: Cigarettes    Quit date: 01/28/1982    Years since quitting: 42.1   Smokeless tobacco: Never  Vaping Use   Vaping status: Never Used  Substance and Sexual Activity   Alcohol  use: No   Drug use: No   Sexual activity: Not on file  Other Topics Concern   Not on file  Social History Narrative   Not on file   Social Drivers of Health   Financial Resource Strain: Low Risk  (03/30/2024)   Overall Financial Resource Strain (CARDIA)    Difficulty of Paying  Living Expenses: Not hard at all  Food Insecurity: No Food Insecurity (03/30/2024)   Hunger Vital Sign    Worried About Running Out of Food in the Last Year: Never true    Ran Out of Food in the Last Year: Never true  Transportation Needs: No Transportation Needs (03/30/2024)   PRAPARE - Administrator, Civil Service (Medical): No    Lack of Transportation (Non-Medical): No  Physical Activity: Sufficiently Active (03/30/2024)   Exercise Vital Sign    Days of Exercise per Week: 7 days    Minutes of Exercise per Session: 30 min  Stress: No Stress Concern Present (03/30/2024)   [employer] of Occupational Health - Occupational Stress Questionnaire    Feeling of Stress: Not at all  Social  Connections: Unknown (03/30/2024)   Social Connection and Isolation Panel    Frequency of Communication with Friends and Family: Three times a week    Frequency of Social Gatherings with Friends and Family: Twice a week    Attends Religious Services: More than 4 times per year    Active Member of [employer] or Organizations: Yes    Attends Banker Meetings: 1 to 4 times per year    Marital Status: Patient unable to answer    Tobacco Counseling Counseling given: Not Answered    Clinical Intake:  Pre-visit preparation completed: Yes  Pain : 0-10 Pain Score: 7  Pain Type: Chronic pain Pain Location: Shoulder Pain Orientation: Right Pain Radiating Towards: N/A Pain Descriptors / Indicators: Constant Pain Relieving Factors: TYLENOL  500 MG and LIDOCAINE  PAIN PATCHES  Pain Relieving Factors: TYLENOL  500 MG and LIDOCAINE  PAIN PATCHES  BMI - recorded: 24.25 Nutritional Status: BMI of 19-24  Normal Nutritional Risks: None Diabetes: No  No results found for: HGBA1C   How often do you need to have someone help you when you read instructions, pamphlets, or other written materials from your doctor or pharmacy?: 1 - Never What is the last grade level you completed in  school?: HSG  Interpreter Needed?: No  Information entered by :: Roz Fuller, LPN.   Activities of Daily Living     03/30/2024    4:24 PM 09/09/2023    5:00 AM  In your present state of health, do you have any difficulty performing the following activities:  Hearing? 0 0  Vision? 0 0  Difficulty concentrating or making decisions? 0 0  Walking or climbing stairs? 0   Dressing or bathing? 0   Doing errands, shopping? 0   Preparing Food and eating ? N   Using the Toilet? N   In the past six months, have you accidently leaked urine? N   Do you have problems with loss of bowel control? N   Managing your Medications? N   Managing your Finances? N   Housekeeping or managing your Housekeeping? N     Patient Care Team: Everhart, Kirstie, DO as PCP - General (Family Medicine) Lonni Slain, MD as PCP - Cardiology (Cardiology)  I have updated your Care Teams any recent Medical Services you may have received from other providers in the past year.     Assessment:   This is a routine wellness examination for [facility].  Hearing/Vision screen Hearing Screening - Comments:: Some hearing difficulties.  Vision Screening - Comments:: Wears rx glasses - up to date with routine eye exams with Inocente Pao, OD.    Goals Addressed             This Visit's Progress    03/30/2024: To do better than I am now!         Depression Screen     03/30/2024    4:33 PM 03/08/2024   10:31 AM 12/13/2023    2:21 PM 09/20/2023    9:32 AM 07/14/2023   11:00 AM  PHQ 2/9 Scores  PHQ - 2 Score 0   0 1  PHQ- 9 Score 0   2 5  Exception Documentation  Patient refusal Patient refusal      Fall Risk     03/30/2024    4:22 PM 03/08/2024   10:31 AM 03/08/2024   10:26 AM 12/13/2023    2:21 PM 09/20/2023    9:32 AM  Fall Risk   Falls in the past  year? 0 0 0 1 1  Number falls in past yr: 0 0 0 0 0  Injury with Fall? 0 0 0 1 1  Risk for fall due to : No Fall Risks    History of fall(s)   Follow up Falls evaluation completed    Falls evaluation completed    MEDICARE RISK AT HOME:  Medicare Risk at Home Any stairs in or around the home?: Yes (STEPS AND ELEVATOR) If so, are there any without handrails?: No Home free of loose throw rugs in walkways, pet beds, electrical cords, etc?: Yes Adequate lighting in your home to reduce risk of falls?: Yes Life alert?: Yes (BEDROOM & BATHROOM) Use of a cane, walker or w/c?: No Grab bars in the bathroom?: Yes Shower chair or bench in shower?: Yes Elevated toilet seat or a handicapped toilet?: Yes  TIMED UP AND GO:  Was the test performed?  No  Cognitive Function: Declined/Normal: No cognitive concerns noted by patient or family. Patient alert, oriented, able to answer questions appropriately and recall recent events. No signs of memory loss or confusion.    03/30/2024    4:23 PM  MMSE - Mini Mental State Exam  Not completed: Unable to complete        03/30/2024    4:23 PM  6CIT Screen  What Year? 0 points  What month? 0 points  What time? 0 points  Count back from 20 0 points  Months in reverse 0 points  Repeat phrase 0 points  Total Score 0 points    Immunizations Immunization History  Administered Date(s) Administered   Fluad Trivalent(High Dose 65+) 07/14/2023   INFLUENZA, HIGH DOSE SEASONAL PF 03/08/2024   Pfizer(Comirnaty)Fall Seasonal Vaccine 12 years and older 07/15/2022, 07/14/2023   Tdap 11/05/2022    Screening Tests Health Maintenance  Topic Date Due   Zoster Vaccines- Shingrix (1 of 2) Never done   Colonoscopy  01/31/2022   COVID-19 Vaccine (8 - 2025-26 season) 02/07/2024   Medicare Annual Wellness (AWV)  03/30/2025   DTaP/Tdap/Td (2 - Td or Tdap) 11/04/2032   Pneumococcal Vaccine: 50+ Years  Completed   Influenza Vaccine  Completed   Hepatitis C Screening  Completed   Meningococcal B Vaccine  Aged Out    Health Maintenance Items Addressed: Yes Vaccines Due: Shingrix, Covid  Additional  Screening:  Vision Screening: Recommended annual ophthalmology exams for early detection of glaucoma and other disorders of the eye. Is the patient up to date with their annual eye exam?  Yes  Who is the provider or what is the name of the office in which the patient attends annual eye exams? [employer], OD.  Dental Screening: Recommended annual dental exams for proper oral hygiene  Community Resource Referral / Chronic Care Management: CRR required this visit?  No   CCM required this visit?  No   Plan:    I have personally reviewed and noted the following in the patient's chart:   Medical and social history Use of alcohol , tobacco or illicit drugs  Current medications and supplements including opioid prescriptions. Patient is not currently taking opioid prescriptions. Functional ability and status Nutritional status Physical activity Advanced directives List of other physicians Hospitalizations, surgeries, and ER visits in previous 12 months Vitals Screenings to include cognitive, depression, and falls Referrals and appointments  In addition, I have reviewed and discussed with patient certain preventive protocols, quality metrics, and best practice recommendations. A written personalized care plan for preventive services as well as  general preventive health recommendations were provided to patient.   Roz LOISE Fuller, LPN   [id number]   After Visit Summary: (MyChart) Due to this being a telephonic visit, the after visit summary with patients personalized plan was offered to patient via MyChart   Notes: Nothing significant to report at this time.

## 2024-03-30 NOTE — Patient Instructions (Signed)
 [name],  Thank you for taking the time for your Medicare Wellness Visit. I appreciate your continued commitment to your health goals. Please review the care plan we discussed, and feel free to reach out if I can assist you further.  Medicare recommends these wellness visits once per year to help you and your care team stay ahead of potential health issues. These visits are designed to focus on prevention, allowing your provider to concentrate on managing your acute and chronic conditions during your regular appointments.  Please note that Annual Wellness Visits do not include a physical exam. Some assessments may be limited, especially if the visit was conducted virtually. If needed, we may recommend a separate in-person follow-up with your provider.  Ongoing Care Seeing your primary care provider every 3 to 6 months helps us  monitor your health and provide consistent, personalized care.   Referrals If a referral was made during today's visit and you haven't received any updates within two weeks, please contact the referred provider directly to check on the status.  Recommended Screenings:  Health Maintenance  Topic Date Due   Zoster (Shingles) Vaccine (1 of 2) Never done   Colon Cancer Screening  01/31/2022   COVID-19 Vaccine (8 - 2025-26 season) 02/07/2024   Medicare Annual Wellness Visit  03/30/2025   DTaP/Tdap/Td vaccine (2 - Td or Tdap) 11/04/2032   Pneumococcal Vaccine for age over 12  Completed   Flu Shot  Completed   Hepatitis C Screening  Completed   Meningitis B Vaccine  Aged Out       03/30/2024    4:23 PM  Advanced Directives  Does Patient Have a Medical Advance Directive? No  Would patient like information on creating a medical advance directive? No - Patient declined   Advance Care Planning is important because it: Ensures you receive medical care that aligns with your values, goals, and preferences. Provides guidance to your family and loved ones, reducing the  emotional burden of decision-making during critical moments.  Vision: Annual vision screenings are recommended for early detection of glaucoma, cataracts, and diabetic retinopathy. These exams can also reveal signs of chronic conditions such as diabetes and high blood pressure.  Dental: Annual dental screenings help detect early signs of oral cancer, gum disease, and other conditions linked to overall health, including heart disease and diabetes.  Please see the attached documents for additional preventive care recommendations.

## 2024-04-13 ENCOUNTER — Other Ambulatory Visit: Payer: Self-pay

## 2024-04-13 ENCOUNTER — Ambulatory Visit (INDEPENDENT_AMBULATORY_CARE_PROVIDER_SITE_OTHER)

## 2024-04-13 VITALS — BP 166/96 | Ht 70.0 in | Wt 169.0 lb

## 2024-04-13 DIAGNOSIS — M25511 Pain in right shoulder: Secondary | ICD-10-CM

## 2024-04-13 DIAGNOSIS — M75101 Unspecified rotator cuff tear or rupture of right shoulder, not specified as traumatic: Secondary | ICD-10-CM | POA: Diagnosis not present

## 2024-04-13 DIAGNOSIS — I10 Essential (primary) hypertension: Secondary | ICD-10-CM | POA: Diagnosis not present

## 2024-04-13 DIAGNOSIS — M7551 Bursitis of right shoulder: Secondary | ICD-10-CM

## 2024-04-13 MED ORDER — METHYLPREDNISOLONE ACETATE 40 MG/ML IJ SUSP
40.0000 mg | Freq: Once | INTRAMUSCULAR | Status: AC
  Administered 2024-04-13: 40 mg via INTRA_ARTICULAR

## 2024-04-15 NOTE — Progress Notes (Cosign Needed)
 [facility] A Department of [facility] [place]. [facility]   PCP: Stoney Blizzard, DO  CHIEF COMPLAINT: persistent R shoulder pain   HPI: Patient is a pleasant 73 y.o. male who presents today for for follow-up regarding right shoulder pain.  At last office visit on 03/16/2024 with myself patient was diagnosed with high-grade partial-thickness tear of right supraspinatus muscle as well as tenosynovitis of long head of biceps on his right side.  This was confirmed via diagnostic ultrasound evaluation.  Patient wishing to avoid surgery and would like to proceed with conservative management.  Was referred to physical therapy but has unable to schedule first appointment yet.  Has been doing his home exercise program but continues to have pain when reaching overhead or trying to sleep at night.  Interested in steroid injection for pain relief today.   PMH:  Past Medical History:  Diagnosis Date   Acute renal failure superimposed on stage 3b chronic kidney disease (HCC) 09/07/2023   Chronic kidney disease, stage 3 (HCC)    Chronic pancreatitis (HCC)    Colon polyps    GERD (gastroesophageal reflux disease)    Hyperlipidemia    Hypertension    Hypothyroidism    Portal vein thrombosis    previously on Coumadin, no longer requiring anticoag   Prediabetes    Rib fractures 09/05/2023   Stroke (HCC)    2004, L sided weakness (no residual)   Syncope 01/04/2021   Vitamin D  deficiency     Patient Active Problem List   Diagnosis Date Noted   History of CVA (cerebrovascular accident) 03/08/2024   Primary open angle glaucoma, left eye 12/30/2023   Chronic kidney disease, stage 3b (HCC) 12/13/2023   Heart failure with mid-range ejection fraction (HFmEF) (HCC) 09/07/2023   Hypothyroidism 01/05/2021   Portal vein thrombosis 01/05/2021   Hypertension 01/29/2012   Hyperlipidemia    Colon polyps     PSurg:  Past Surgical History:  Procedure Laterality Date    CHOLECYSTECTOMY     NECK SURGERY     bone spurs   SHOULDER ARTHROSCOPY Right 12/07/2013   THYROIDECTOMY      Allergies: Aspirin, Aspirin-acetaminophen -caffeine, Hydrocodone , and Chlorthalidone  Meds:  Previous Medications   ACETAMINOPHEN  (TYLENOL ) 500 MG TABLET    Take 1,000 mg by mouth daily as needed.   ARTIFICIAL TEARS OPHTHALMIC SOLUTION    Place 1 drop into both eyes 4 (four) times daily.   ATORVASTATIN  (LIPITOR) 80 MG TABLET    Take 1 tablet (80 mg total) by mouth daily.   CALCIUM  CARBONATE (TUMS - DOSED IN MG ELEMENTAL CALCIUM ) 500 MG CHEWABLE TABLET    Chew 1 tablet by mouth daily.   CARBOXYMETHYLCELLULOSE 1 % OPHTHALMIC SOLUTION    Apply 1 drop to eye 4 (four) times daily as needed (for eye irritation).   CARVEDILOL  (COREG ) 12.5 MG TABLET    Take 1 tablet (12.5 mg total) by mouth 2 (two) times daily with a meal.   CETIRIZINE  (ZYRTEC ) 10 MG TABLET    Take 1 tablet (10 mg total) by mouth daily as needed for allergies.   CHOLECALCIFEROL  (VITAMIN D3) 25 MCG (1000 UNIT) TABLET    Take 1 tablet (1,000 Units total) by mouth daily.   DAPAGLIFLOZIN  PROPANEDIOL (FARXIGA ) 10 MG TABS TABLET    Take 1 tablet (10 mg total) by mouth daily before breakfast.   FAMOTIDINE  (PEPCID ) 20 MG TABLET    Take 1 tablet (20 mg total) by mouth daily.  LATANOPROST (XALATAN) 0.005 % OPHTHALMIC SOLUTION    Apply 1 drop to eye.   LEVOTHYROXINE  (SYNTHROID ) 75 MCG TABLET    Take 1 tablet (75 mcg total) by mouth daily before breakfast.   LISINOPRIL  (ZESTRIL ) 20 MG TABLET    Take 1 tablet (20 mg total) by mouth at bedtime.    Social:  Social History   Tobacco Use   Smoking status: Former    Current packs/day: 0.00    Types: Cigarettes    Quit date: 01/28/1982    Years since quitting: 42.2   Smokeless tobacco: Never  Substance Use Topics   Alcohol  use: No    REVIEW OF SYSTEMS:  ROS negative except as noted in HPI above   Objective Exam:  Vitals:   04/13/24 0939  BP: (!) 166/96  Weight: 169 lb  (76.7 kg)  Height: 5' 10 (1.778 m)    GENERAL: Patient is afebrile, Vital signs reviewed, well appearing, Patient appears comfortable, Alert and lucid. No apparent distress.   Physical Exam   Ortho Exam:  On inspection of left shoulder no evidence of erythema, ecchymosis, or edema present.  Denies tenderness to palpation over bony landmarks of shoulder including but not limited to [facility] joint, acromion.  Patient has full passive range of motion of shoulder in extension, adduction, internal and external rotation.  Limited active range of motion with abduction and shoulder flexion secondary to pain..  Rotator cuff strength testing is 5/5 bilaterally in all planes of motion with the exception of resisted abduction, strength 4/5.  Neurovascularly intact distally.  Empty can testing positive for pain negative for gross weakness.  Negative drop arm test.  O'Brien's negative.  Hawkins mildly positive.  Neer's negative.  Apprehension test negative.  Cross body adduction testing negative.  Speeds positive.  Yergason's positive.   RESULTS:  Labs: No results found for this or any previous visit (from the past 48 hours).  Imaging:  No orders to display   US -Guided Subacromial Injection, Right Shoulder  After discussion on risks/benefits/indications, informed verbal consent was obtained. A timeout was then performed. Patient was seated on table in exam room. The patient's shoulder was prepped with chlorhexadine and alcohol  swabs and utilizing lateral approach with ultrasound guidance, the patient's subacromial space was injected with 4:1 mixture of lidocaine ::depomedrol via an in-plane approach. Patient tolerated the procedure well without immediate complications.    Assessment/Plan:  1. Right shoulder pain, unspecified chronicity   2. Tear of right supraspinatus tendon   3. Subacromial bursitis of right shoulder joint   4. Primary hypertension    -Patient still wishing to manage chronic right shoulder  pain through conservative nonoperative measures.  Patient requested a corticosteroid injection for pain relief at this time.  Procedure successfully completed as noted in procedure note above and patient endorsed pain relief following procedure - Patient will schedule with physical therapy in upcoming week for his first appointment - Continue Tylenol  and lidocaine  patches for additional pain relief as needed - Recommended to follow-up with PCP for further management of hypertension given elevated blood pressure today's appointment. - Patient understands and agrees to treatment plan.  No further questions or concerns at this time.   New Prescriptions   No medications on file    Medications, medical history, allergies, surgical history, hospitalizations, family history, social history, ROS and vitals entered by nursing staff and reviewed by myself.  I discussed with the patient the diagnosis, treatment plan, indications for return to the emergency department, and for expected  follow-up. The patient verbalized an understanding. The patient is asked if there are any questions or concerns. We discuss the case, until all issues are addressed to the patient's satisfaction.  Follow up per instructions including returning for additional office visit if symptoms worsen or proceeding to the emergency department or urgent care in the next 12-24hrs if there is an acute concerning increasing symptoms, pain, fevers, or other symptoms.  Prentice Agent, DO  2:18 PM, 04/15/2024

## 2024-04-25 NOTE — Therapy (Unsigned)
 OUTPATIENT PHYSICAL THERAPY SHOULDER EVALUATION   Patient Name: [name] MRN: [id number] DOB:08/06/1950, 73 y.o., male Today's Date: 04/27/2024  END OF SESSION:  PT End of Session - 04/27/24 1311     Visit Number 1    Number of Visits 12    Date for Recertification  06/28/23    Authorization Type [employer] MCR    PT Start Time 1215    PT Stop Time 1300    PT Time Calculation (min) 45 min    Activity Tolerance Patient tolerated treatment well    Behavior During Therapy [facility] for tasks assessed/performed          Past Medical History:  Diagnosis Date   Acute renal failure superimposed on stage 3b chronic kidney disease (HCC) 09/07/2023   Chronic kidney disease, stage 3 (HCC)    Chronic pancreatitis (HCC)    Colon polyps    GERD (gastroesophageal reflux disease)    Hyperlipidemia    Hypertension    Hypothyroidism    Portal vein thrombosis    previously on Coumadin, no longer requiring anticoag   Prediabetes    Rib fractures 09/05/2023   Stroke (HCC)    2004, L sided weakness (no residual)   Syncope 01/04/2021   Vitamin D  deficiency    Past Surgical History:  Procedure Laterality Date   CHOLECYSTECTOMY     NECK SURGERY     bone spurs   SHOULDER ARTHROSCOPY Right 12/07/2013   THYROIDECTOMY     Patient Active Problem List   Diagnosis Date Noted   History of CVA (cerebrovascular accident) 03/08/2024   Primary open angle glaucoma, left eye 12/30/2023   Chronic kidney disease, stage 3b (HCC) 12/13/2023   Heart failure with mid-range ejection fraction (HFmEF) (HCC) 09/07/2023   Hypothyroidism 01/05/2021   Portal vein thrombosis 01/05/2021   Hypertension 01/29/2012   Hyperlipidemia    Colon polyps     PCP: Stoney Blizzard, DO   REFERRING PROVIDER: Lynwood Barter, DO  REFERRING DIAG: M75.101 (ICD-10-CM) - Tear of right supraspinatus tendon M75.21 (ICD-10-CM) - Tendonitis, bicipital, right M19.011 (ICD-10-CM) - Localized osteoarthritis of right  shoulder  THERAPY DIAG:  Acute pain of right shoulder  Supraspinatus tendinitis, right  Abnormal posture  Muscle weakness (generalized)  Rationale for Evaluation and Treatment: Rehabilitation  ONSET DATE: chronic  SUBJECTIVE:                                                                                                                                                                                      SUBJECTIVE STATEMENT: Reports hearing a pop in R shoulder.  Saw MD for CSI which helped with pain Hand dominance: Right  PERTINENT HISTORY: Patient is a pleasant 73 y.o. male who presents today for for follow-up regarding right shoulder pain.  At last office visit on 03/16/2024 with myself patient was diagnosed with high-grade partial-thickness tear of right supraspinatus muscle as well as tenosynovitis of long head of biceps on his right side.  This was confirmed via diagnostic ultrasound evaluation.  Patient wishing to avoid surgery and would like to proceed with conservative management.  Was referred to physical therapy but has unable to schedule first appointment yet.  Has been doing his home exercise program but continues to have pain when reaching overhead or trying to sleep at night.  Interested in steroid injection for pain relief today.  PAIN:  Are you having pain? Yes: NPRS scale: 4/10 Pain location: R shoulder Pain description: ache Aggravating factors: reaching OH and behind  Relieving factors: rest, pain patch, Tylenol   PRECAUTIONS: None  RED FLAGS: None   WEIGHT BEARING RESTRICTIONS: No  FALLS:  Has patient fallen in last 6 months? No  OCCUPATION: Not working  PLOF: Independent  PATIENT GOALS:To manage my shoulder pain w/o surgery  NEXT MD VISIT:   OBJECTIVE:  Note: Objective measures were completed at Evaluation unless otherwise noted.  DIAGNOSTIC FINDINGS:  IMPRESSION:  Complete ultrasound of right shoulder showing evidence of high-grade  partial-thickness tearing of supraspinatus as well as bicipital tenosynovitis with surrounding fluid.   Images and interpretation completed by Prentice Agent, DO and Ludie Littler, MD  PATIENT SURVEYS:  [name]:  QUICK DASH  Please rate your ability do the following activities in the last week by selecting the number below the appropriate response.    (A QuickDASH score may not be calculated if there is greater than 1 missing item.)    Minimally Clinically Important Difference (MCID): 15-20 points  [name], F. et al. (2013). Minimally clinically important difference of the disabilities of the arm, shoulder, and hand outcome measures (DASH) and its shortened version (Quick DASH). Journal of Orthopaedic & Sports Physical Therapy, 44(1), 30-39)    POSTURE: TTP biceps tendon and CA ligament  UPPER EXTREMITY ROM:   A/PROM Right eval Left eval  Shoulder flexion 135/150d   Shoulder extension [facility]   Shoulder abduction 135/150d   Shoulder adduction    Shoulder internal rotation /WFL   Shoulder external rotation /WFL    Elbow flexion    Elbow extension    Wrist flexion    Wrist extension    Wrist ulnar deviation    Wrist radial deviation    Wrist pronation    Wrist supination    (Blank rows = not tested)  UPPER EXTREMITY MMT:  MMT Right eval Left eval  Shoulder flexion 4-   Shoulder extension    Shoulder abduction 4-   Shoulder adduction    Shoulder internal rotation    Shoulder external rotation    Middle trapezius    Lower trapezius    Elbow flexion    Elbow extension    Wrist flexion    Wrist extension    Wrist ulnar deviation    Wrist radial deviation    Wrist pronation    Wrist supination    Grip strength (lbs)    (Blank rows = not tested)  SHOULDER SPECIAL TESTS: Impingement tests: Neer impingement test: positive  and Hawkins/Kennedy impingement test: positive  Rotator cuff assessment: Drop arm test: negative, Empty can test: negative, Full can  test: negative, Belly press test: negative, and Hornblower's sign: positive  TREATMENT: [facility] Adult PT Treatment:                                                DATE: 04/27/24 Eval and HEP Self Care: Additional minutes spent for educating on updated Therapeutic Home Exercise Program as well as comparing current status to condition at start of symptoms. This included exercises focusing on stretching, strengthening, with focus on eccentric aspects. Long term goals include an improvement in range of motion, strength, endurance as well as avoiding reinjury. Patient's frequency would include in 1-2 times a day, 3-5 times a week for a duration of 6-12 weeks. Proper technique shown and discussed handout in great detail. All questions were discussed and addressed.     PATIENT EDUCATION: Education details: Discussed eval findings, rehab rationale and POC and patient is in agreement  Person educated: Patient Education method: Explanation and Handouts Education comprehension: verbalized understanding and needs further education  HOME EXERCISE PROGRAM: Access Code: [email or web address] URL: [email or web address] Date: 04/27/2024 Prepared by: Reyes Kohut  Exercises - Seated Shoulder Scaption  - 2-3 x daily - 5 x weekly - 1 sets - 10 reps - Seated Shoulder Horizontal Abduction with Resistance  - 2-3 x daily - 5 x weekly - 1 sets - 10 reps - Seated Shoulder W External Rotation on Swiss Ball  - 2-3 x daily - 5 x weekly - 1 sets - 10 reps  ASSESSMENT:  CLINICAL IMPRESSION: Patient is a 73 y.o. male  who was seen today for physical therapy evaluation and treatment for R shoulder pain due to underlying supraspinatus partial tear.  Recent CSI has been beneficial, creasing pain from 7/10 to 4/10.  Patient able to raise RUE OH and behind back with pain.  PROM is WFL, RC appears intact  but resisted tasks are painful.  Mild TTP at R [facility] joint, more so at biceps tendon and CA ligament.  Patient is a good rehab candidate to regain shoulder mobility and function.  OBJECTIVE IMPAIRMENTS: decreased activity tolerance, decreased knowledge of condition, decreased ROM, decreased strength, impaired perceived functional ability, impaired UE functional use, postural dysfunction, and pain.   ACTIVITY LIMITATIONS: carrying, lifting, sleeping, and dressing  PERSONAL FACTORS: Age and Past/current experiences are also affecting patient's functional outcome.   REHAB POTENTIAL: Good  CLINICAL DECISION MAKING: Stable/uncomplicated  EVALUATION COMPLEXITY: Moderate   GOALS: Goals reviewed with patient? No  SHORT TERM GOALS: Target date: 05/18/2024   Patient to demonstrate independence in HEP  Baseline: [email or web address] Goal status: INITIAL  2.  AROM R shoulder 160d flexion and abd Baseline:  A/PROM Right eval Left eval  Shoulder flexion 135/150d   Shoulder extension WFL   Shoulder abduction 135/150d    Goal status: INITIAL    LONG TERM GOALS: Target date: 06/22/2024  Patient will acknowledge <4/10 pain at least once during episode of care  Baseline: 4/10 Goal status: INITIAL  2.  Increase R shoulder strength to 4/5 Baseline:  MMT Right eval Left eval  Shoulder flexion 4-   Shoulder extension    Shoulder abduction 4-    Goal status: INITIAL  3.  Patient will score at least 14/55 on qDASH to signify clinically meaningful improvement in functional abilities.   Baseline: 22/55 Goal status: INITIAL    PLAN:  PT FREQUENCY: 1-2x/week  PT DURATION: 6 weeks  PLANNED INTERVENTIONS: 97110-Therapeutic exercises, 97530- Therapeutic activity,  02887- Neuromuscular re-education, [phone]- Self Care, 02859- Manual therapy, and Patient/Family education  PLAN FOR NEXT SESSION: HEP review and update, manual techniques as appropriate, aerobic tasks, ROM and flexibility activities,  strengthening and PREs, TPDN, gait and balance training,aquatic therapy, modalities for pain and NMRE    Date of referral: 03/16/2024 Referring provider: Lynwood Barter, DO Referring diagnosis? M75.101 (ICD-10-CM) - Tear of right supraspinatus tendon M75.21 (ICD-10-CM) - Tendonitis, bicipital, right M19.011 (ICD-10-CM) - Localized osteoarthritis of right shoulder Treatment diagnosis? (if different than referring diagnosis) Partial-thickness tearing of right supraspinatus and right bicipital tendinitis  What was this (referring dx) caused by? Surgery (Type: lifting)  Nature of Condition: Initial Onset (within last 3 months)   Laterality: Rt  Current Functional Measure Score: DASH 22/55  Objective measurements identify impairments when they are compared to normal values, the uninvolved extremity, and prior level of function.  [x]  Yes  []  No  Objective assessment of functional ability: Moderate functional limitations   Briefly describe symptoms: pain with OH and behind the back motions  How did symptoms start: lifting  Average pain intensity:  Last 24 hours: 4  Past week: 4  How often does the pt experience symptoms? Frequently  How much have the symptoms interfered with usual daily activities? Moderately  How has condition changed since care began at this facility? NA - initial visit  In general, how is the patients overall health? Good   BACK PAIN (STarT Back Screening Tool) No   Reyes CHRISTELLA Kohut, PT 04/27/2024, 1:12 PM

## 2024-04-26 ENCOUNTER — Ambulatory Visit: Admitting: Family Medicine

## 2024-04-26 VITALS — BP 130/90 | HR 88 | Temp 98.1°F | Ht 70.0 in | Wt 163.0 lb

## 2024-04-26 DIAGNOSIS — Z1211 Encounter for screening for malignant neoplasm of colon: Secondary | ICD-10-CM

## 2024-04-26 DIAGNOSIS — I10 Essential (primary) hypertension: Secondary | ICD-10-CM

## 2024-04-26 MED ORDER — CETIRIZINE HCL 10 MG PO TABS: 10.0000 mg | ORAL_TABLET | Freq: Every day | ORAL | 1 refills | Status: AC | PRN

## 2024-04-26 NOTE — Progress Notes (Signed)
    SUBJECTIVE:   CHIEF COMPLAINT / HPI:   Hx HTN on Lisinopril  20mg , carvedilol  12.5mg  BID Previously had ambulatory BP monitoring, average awake BP 139/87 with nocturnal dipping of 20-25% when asleep However, BP has been elevated at recent office visits (166/96 on 04/2024, 171/91 on 03/16/24) He has been feeling well. No complaints today.   PERTINENT  PMH / PSH: HTN, HFmEF, hypothyroidism, CKD3b, HLD, hx CVA   OBJECTIVE:   BP (!) 130/90 (BP Location: Left Arm, Patient Position: Sitting, Cuff Size: Normal)   Pulse 88   Temp 98.1 F (36.7 C) (Oral)   Ht 5' 10 (1.778 m)   Wt 163 lb (73.9 kg)   SpO2 100%   BMI 23.39 kg/m   General: well appearing, NAD Cardiovascular: RRR, no m/r/g Respiratory: normal work of breathing on RA  ASSESSMENT/PLAN:   Assessment & Plan Primary hypertension Manual BP today 130/90.  He has an upper arm cuff at home that he can check his blood pressure with.  He is going to check his blood pressure at home daily for the next 2 week and we will follow-up in 2 weeks to discuss adding an additional medication if needed. Screening for colon cancer Colonoscopy ordered     [name], DO [facility]

## 2024-04-26 NOTE — Assessment & Plan Note (Signed)
 Manual BP today 130/90.  He has an upper arm cuff at home that he can check his blood pressure with.  He is going to check his blood pressure at home daily for the next 2 week and we will follow-up in 2 weeks to discuss adding an additional medication if needed.

## 2024-04-26 NOTE — Patient Instructions (Signed)
 Good to see you today - Thank you for coming in  Things we discussed today:  Your goal blood pressure is less than 130/80  Check your blood pressure several times a week.  If regularly higher than this please let me know - either with MyChart or leaving a phone message. Next visit please bring in your blood pressure cuff or log

## 2024-04-27 ENCOUNTER — Ambulatory Visit

## 2024-04-27 DIAGNOSIS — R293 Abnormal posture: Secondary | ICD-10-CM | POA: Diagnosis present

## 2024-04-27 DIAGNOSIS — M6281 Muscle weakness (generalized): Secondary | ICD-10-CM | POA: Diagnosis present

## 2024-04-27 DIAGNOSIS — M75101 Unspecified rotator cuff tear or rupture of right shoulder, not specified as traumatic: Secondary | ICD-10-CM | POA: Diagnosis not present

## 2024-04-27 DIAGNOSIS — M25511 Pain in right shoulder: Secondary | ICD-10-CM | POA: Insufficient documentation

## 2024-04-27 DIAGNOSIS — M7521 Bicipital tendinitis, right shoulder: Secondary | ICD-10-CM | POA: Diagnosis not present

## 2024-04-27 DIAGNOSIS — M7591 Shoulder lesion, unspecified, right shoulder: Secondary | ICD-10-CM | POA: Insufficient documentation

## 2024-04-27 DIAGNOSIS — M19011 Primary osteoarthritis, right shoulder: Secondary | ICD-10-CM | POA: Insufficient documentation

## 2024-05-08 ENCOUNTER — Encounter: Payer: Self-pay | Admitting: Pharmacist

## 2024-05-08 NOTE — Progress Notes (Signed)
 This patient is appearing on a report for being at risk of failing the adherence measure CHF / CKD medications this calendar year.   Does not have DM diagnosis.   Medication: Farxiga  (dapagliflozin )  Last fill date: 05/01/24 for 30 day supply - Pass for 2025  Reviewed medication indication, dosing, and goals of therapy.

## 2024-05-09 ENCOUNTER — Ambulatory Visit: Payer: Self-pay | Admitting: Family Medicine

## 2024-05-09 ENCOUNTER — Encounter: Payer: Self-pay | Admitting: Family Medicine

## 2024-05-09 VITALS — BP 136/86 | HR 95 | Ht 70.0 in | Wt 163.8 lb

## 2024-05-09 DIAGNOSIS — I10 Essential (primary) hypertension: Secondary | ICD-10-CM | POA: Diagnosis not present

## 2024-05-09 DIAGNOSIS — I5020 Unspecified systolic (congestive) heart failure: Secondary | ICD-10-CM

## 2024-05-09 NOTE — Patient Instructions (Addendum)
 Good to see you today - Thank you for coming in  Things we discussed today:  Your blood pressure looks good.  I will see you in 3 to 4 months for checkup/discuss your medicine refills.  If you need anything before then, let me know. If you do not hear about your colonoscopy in the next week or 2 let me know

## 2024-05-09 NOTE — Assessment & Plan Note (Signed)
 Blood pressure improved at this visit compared to prior. Home log shows mainly 130-135/80-85 which is acceptable given his risk of falls. Continue following with cardiology, last echo 01/2024 with LV WMA, mild left ventricular hypertrophy Continue Carvedilol  12.5mg  BID, Lisinopril  20mg  daily at bedtime If uncontrolled at future visits, consider switching to ARB F/u 3-4 months for BMP, CBC

## 2024-05-09 NOTE — Progress Notes (Signed)
    SUBJECTIVE:   CHIEF COMPLAINT / HPI:   Blood pressure follow-up He has kept a blood pressure log at home last 2 weeks Asymptomatic, no headaches, vision changes, dizziness or any other concerns  Has not heard about colonoscopy yet  PERTINENT  PMH / PSH: HTN, HFmEF, hypothyroidism, CKD3b, HLD, hx CVA, open angle glaucoma   OBJECTIVE:   BP 136/86   Pulse 95   Ht 5' 10 (1.778 m)   Wt 163 lb 12.8 oz (74.3 kg)   SpO2 100%   BMI 23.50 kg/m   General: well appearing, NAD Cardiovascular: RRR, no m/r/g Respiratory: normal work of breathing on RA, CTAB  ASSESSMENT/PLAN:   Assessment & Plan Primary hypertension Heart failure with mid-range ejection fraction (HFmEF) (HCC) Blood pressure improved at this visit compared to prior. Home log shows mainly 130-135/80-85 which is acceptable given his risk of falls. Continue following with cardiology, last echo 01/2024 with LV WMA, mild left ventricular hypertrophy Continue Carvedilol  12.5mg  BID, Lisinopril  20mg  daily at bedtime If uncontrolled at future visits, consider switching to ARB F/u 3-4 months for BMP, CBC   Colonoscopy has been ordered  [name], DO [facility]

## 2024-05-11 NOTE — Therapy (Unsigned)
 OUTPATIENT PHYSICAL THERAPY SHOULDER EVALUATION   Patient Name: [name] MRN: [id number] DOB:[birth year], 73 y.o., male Today's Date: 05/12/2024  END OF SESSION:  PT End of Session - 05/12/24 1236     Visit Number 2    Number of Visits 12    Date for Recertification  06/28/23    Authorization Type [employer] MCR    PT Start Time 1230    PT Stop Time 1310    PT Time Calculation (min) 40 min    Activity Tolerance Patient tolerated treatment well    Behavior During Therapy WFL for tasks assessed/performed           Past Medical History:  Diagnosis Date   Acute renal failure superimposed on stage 3b chronic kidney disease (HCC) 09/07/2023   Chronic kidney disease, stage 3 (HCC)    Chronic pancreatitis (HCC)    Colon polyps    GERD (gastroesophageal reflux disease)    Hyperlipidemia    Hypertension    Hypothyroidism    Portal vein thrombosis    previously on Coumadin, no longer requiring anticoag   Prediabetes    Rib fractures 09/05/2023   Stroke (HCC)    2004, L sided weakness (no residual)   Syncope 01/04/2021   Vitamin D  deficiency    Past Surgical History:  Procedure Laterality Date   CHOLECYSTECTOMY     NECK SURGERY     bone spurs   SHOULDER ARTHROSCOPY Right 12/07/2013   THYROIDECTOMY     Patient Active Problem List   Diagnosis Date Noted   History of CVA (cerebrovascular accident) 03/08/2024   Primary open angle glaucoma, left eye 12/30/2023   Chronic kidney disease, stage 3b (HCC) 12/13/2023   Heart failure with mid-range ejection fraction (HFmEF) (HCC) 09/07/2023   Hypothyroidism 01/05/2021   Portal vein thrombosis 01/05/2021   Hypertension 01/29/2012   Hyperlipidemia    Colon polyps     PCP: Guy Blizzard, DO   REFERRING PROVIDER: Lynwood Barter, DO  REFERRING DIAG: M75.101 (ICD-10-CM) - Tear of right supraspinatus tendon M75.21 (ICD-10-CM) - Tendonitis, bicipital, right M19.011 (ICD-10-CM) - Localized osteoarthritis of right  shoulder  THERAPY DIAG:  Acute pain of right shoulder  Supraspinatus tendinitis, right  Abnormal posture  Muscle weakness (generalized)  Rationale for Evaluation and Treatment: Rehabilitation  ONSET DATE: chronic  SUBJECTIVE:                                                                                                                                                                                      SUBJECTIVE STATEMENT: Reports hearing a pop in R shoulder.  Saw MD for CSI which helped with pain Hand dominance:  Right  PERTINENT HISTORY: Patient is a pleasant 73 y.o. male who presents today for for follow-up regarding right shoulder pain.  At last office visit on 03/16/2024 with myself patient was diagnosed with high-grade partial-thickness tear of right supraspinatus muscle as well as tenosynovitis of long head of biceps on his right side.  This was confirmed via diagnostic ultrasound evaluation.  Patient wishing to avoid surgery and would like to proceed with conservative management.  Was referred to physical therapy but has unable to schedule first appointment yet.  Has been doing his home exercise program but continues to have pain when reaching overhead or trying to sleep at night.  Interested in steroid injection for pain relief today.  PAIN:  Are you having pain? Yes: NPRS scale: 4/10 Pain location: R shoulder Pain description: ache Aggravating factors: reaching OH and behind  Relieving factors: rest, pain patch, Tylenol   PRECAUTIONS: None  RED FLAGS: None   WEIGHT BEARING RESTRICTIONS: No  FALLS:  Has patient fallen in last 6 months? No  OCCUPATION: Not working  PLOF: Independent  PATIENT GOALS:To manage my shoulder pain w/o surgery  NEXT MD VISIT:   OBJECTIVE:  Note: Objective measures were completed at Evaluation unless otherwise noted.  DIAGNOSTIC FINDINGS:  IMPRESSION:  Complete ultrasound of right shoulder showing evidence of high-grade  partial-thickness tearing of supraspinatus as well as bicipital tenosynovitis with surrounding fluid.   Images and interpretation completed by Guy Agent, DO and Guy Littler, MD  PATIENT SURVEYS:  [name]:  QUICK DASH  Please rate your ability do the following activities in the last week by selecting the number below the appropriate response.    (A QuickDASH score may not be calculated if there is greater than 1 missing item.)    Minimally Clinically Important Difference (MCID): 15-20 points  [name], F. et al. (2013). Minimally clinically important difference of the disabilities of the arm, shoulder, and hand outcome measures (DASH) and its shortened version (Quick DASH). Journal of Orthopaedic & Sports Physical Therapy, 44(1), 30-39)    POSTURE: TTP biceps tendon and CA ligament  UPPER EXTREMITY ROM:   A/PROM Right eval Left eval  Shoulder flexion 135/150d   Shoulder extension [facility]   Shoulder abduction 135/150d   Shoulder adduction    Shoulder internal rotation /WFL   Shoulder external rotation /WFL    Elbow flexion    Elbow extension    Wrist flexion    Wrist extension    Wrist ulnar deviation    Wrist radial deviation    Wrist pronation    Wrist supination    (Blank rows = not tested)  UPPER EXTREMITY MMT:  MMT Right eval Left eval  Shoulder flexion 4-   Shoulder extension    Shoulder abduction 4-   Shoulder adduction    Shoulder internal rotation    Shoulder external rotation    Middle trapezius    Lower trapezius    Elbow flexion    Elbow extension    Wrist flexion    Wrist extension    Wrist ulnar deviation    Wrist radial deviation    Wrist pronation    Wrist supination    Grip strength (lbs)    (Blank rows = not tested)  SHOULDER SPECIAL TESTS: Impingement tests: Neer impingement test: positive  and Hawkins/Kennedy impingement test: positive  Rotator cuff assessment: Drop arm test: negative, Empty can test: negative, Full can  test: negative, Belly press test: negative, and Hornblower's sign: positive  TREATMENT: [facility] Adult PT Treatment:                                                DATE: 05/12/24 Therapeutic Exercise: Nustep L2 8 min Manual Therapy: R pec minor release 2 min 4 way scapula 15x2  PROM R shoulder PNF D1 F/E 15x supine  Therapeutic Activity: Seated scaption 15x Seated hor abd YTB 15x Seated ER YTB 15x Supine hor abd YTB 15x B, 15/15  [facility] Adult PT Treatment:                                                DATE: 04/27/24 Eval and HEP Self Care: Additional minutes spent for educating on updated Therapeutic Home Exercise Program as well as comparing current status to condition at start of symptoms. This included exercises focusing on stretching, strengthening, with focus on eccentric aspects. Long term goals include an improvement in range of motion, strength, endurance as well as avoiding reinjury. Patient's frequency would include in 1-2 times a day, 3-5 times a week for a duration of 6-12 weeks. Proper technique shown and discussed handout in great detail. All questions were discussed and addressed.     PATIENT EDUCATION: Education details: Discussed eval findings, rehab rationale and POC and patient is in agreement  Person educated: Patient Education method: Explanation and Handouts Education comprehension: verbalized understanding and needs further education  HOME EXERCISE PROGRAM: Access Code: [email or web address] URL: [email or web address] Date: 04/27/2024 Prepared by: Vinessa Macconnell  Exercises - Seated Shoulder Scaption  - 2-3 x daily - 5 x weekly - 1 sets - 10 reps - Seated Shoulder Horizontal Abduction with Resistance  - 2-3 x daily - 5 x weekly - 1 sets - 10 reps - Seated Shoulder W External Rotation on Swiss Ball  - 2-3 x daily - 5 x weekly - 1 sets - 10  reps  ASSESSMENT:  CLINICAL IMPRESSION:  First f/u session with patient reporting compliance with HEP.  Focus of today was aerobic warm up followed by HEP review.    Added PROM and pec minor release which improved mobility/soreness.  Unable to lie prone due to neck pathologies.  Incorporated PNF patters and scapular clock to program.  Patient is a 73 y.o. male  who was seen today for physical therapy evaluation and treatment for R shoulder pain due to underlying supraspinatus partial tear.  Recent CSI has been beneficial, decreasing pain from 7/10 to 4/10.  Patient able to raise RUE OH and behind back with pain.  PROM is WFL, RC appears intact but resisted tasks are painful.  Mild TTP at R [facility] joint, more so at biceps tendon and CA ligament.  Patient is a good rehab candidate to regain shoulder mobility and function.  OBJECTIVE IMPAIRMENTS: decreased activity tolerance, decreased knowledge of condition, decreased ROM, decreased strength, impaired perceived functional ability, impaired UE functional use, postural dysfunction, and pain.   ACTIVITY LIMITATIONS: carrying, lifting, sleeping, and dressing  PERSONAL FACTORS: Age and Past/current experiences are also affecting patient's functional outcome.   REHAB POTENTIAL: Good  CLINICAL DECISION MAKING: Stable/uncomplicated  EVALUATION COMPLEXITY: Moderate   GOALS: Goals reviewed with patient? No  SHORT TERM GOALS: Target date: 05/18/2024   Patient to demonstrate independence  in HEP  Baseline: [email or web address] Goal status: INITIAL  2.  AROM R shoulder 160d flexion and abd Baseline:  A/PROM Right eval Left eval  Shoulder flexion 135/150d   Shoulder extension WFL   Shoulder abduction 135/150d    Goal status: INITIAL    LONG TERM GOALS: Target date: 06/22/2024  Patient will acknowledge <4/10 pain at least once during episode of care  Baseline: 4/10 Goal status: INITIAL  2.  Increase R shoulder strength to 4/5 Baseline:  MMT  Right eval Left eval  Shoulder flexion 4-   Shoulder extension    Shoulder abduction 4-    Goal status: INITIAL  3.  Patient will score at least 14/55 on qDASH to signify clinically meaningful improvement in functional abilities.   Baseline: 22/55 Goal status: INITIAL    PLAN:  PT FREQUENCY: 1-2x/week  PT DURATION: 6 weeks  PLANNED INTERVENTIONS: 97110-Therapeutic exercises, 97530- Therapeutic activity, 97112- Neuromuscular re-education, 97535- Self Care, [id number]- Manual therapy, and Patient/Family education  PLAN FOR NEXT SESSION: HEP review and update, manual techniques as appropriate, aerobic tasks, ROM and flexibility activities, strengthening and PREs, TPDN, gait and balance training,aquatic therapy, modalities for pain and NMRE    Date of referral: 03/16/2024 Referring provider: Lynwood Barter, DO Referring diagnosis? M75.101 (ICD-10-CM) - Tear of right supraspinatus tendon M75.21 (ICD-10-CM) - Tendonitis, bicipital, right M19.011 (ICD-10-CM) - Localized osteoarthritis of right shoulder Treatment diagnosis? (if different than referring diagnosis) Partial-thickness tearing of right supraspinatus and right bicipital tendinitis  What was this (referring dx) caused by? Surgery (Type: lifting)  Nature of Condition: Initial Onset (within last 3 months)   Laterality: Rt  Current Functional Measure Score: DASH 22/55  Objective measurements identify impairments when they are compared to normal values, the uninvolved extremity, and prior level of function.  [x]  Yes  []  No  Objective assessment of functional ability: Moderate functional limitations   Briefly describe symptoms: pain with OH and behind the back motions  How did symptoms start: lifting  Average pain intensity:  Last 24 hours: 4  Past week: 4  How often does the pt experience symptoms? Frequently  How much have the symptoms interfered with usual daily activities? Moderately  How has condition changed since  care began at this facility? NA - initial visit  In general, how is the patients overall health? Good   BACK PAIN (STarT Back Screening Tool) No   Reyes CHRISTELLA Kohut, PT 05/12/2024, 1:19 PM

## 2024-05-12 ENCOUNTER — Ambulatory Visit

## 2024-05-12 DIAGNOSIS — R293 Abnormal posture: Secondary | ICD-10-CM | POA: Insufficient documentation

## 2024-05-12 DIAGNOSIS — M6281 Muscle weakness (generalized): Secondary | ICD-10-CM | POA: Diagnosis present

## 2024-05-12 DIAGNOSIS — M25511 Pain in right shoulder: Secondary | ICD-10-CM | POA: Insufficient documentation

## 2024-05-12 DIAGNOSIS — M7591 Shoulder lesion, unspecified, right shoulder: Secondary | ICD-10-CM | POA: Diagnosis present

## 2024-05-15 NOTE — Therapy (Unsigned)
 OUTPATIENT PHYSICAL THERAPY SHOULDER EVALUATION   Patient Name: [name] MRN: [id number] DOB:May 05, 1951, 73 y.o., male Today's Date: 05/16/2024  END OF SESSION:  PT End of Session - 05/16/24 1306     Visit Number 3    Number of Visits 12    Date for Recertification  06/28/23    Authorization Type [employer] MCR    PT Start Time 1305    PT Stop Time 1345    PT Time Calculation (min) 40 min    Activity Tolerance Patient tolerated treatment well    Behavior During Therapy [facility] for tasks assessed/performed            Past Medical History:  Diagnosis Date   Acute renal failure superimposed on stage 3b chronic kidney disease (HCC) 09/07/2023   Chronic kidney disease, stage 3 (HCC)    Chronic pancreatitis (HCC)    Colon polyps    GERD (gastroesophageal reflux disease)    Hyperlipidemia    Hypertension    Hypothyroidism    Portal vein thrombosis    previously on Coumadin, no longer requiring anticoag   Prediabetes    Rib fractures 09/05/2023   Stroke (HCC)    2004, L sided weakness (no residual)   Syncope 01/04/2021   Vitamin D  deficiency    Past Surgical History:  Procedure Laterality Date   CHOLECYSTECTOMY     NECK SURGERY     bone spurs   SHOULDER ARTHROSCOPY Right 12/07/2013   THYROIDECTOMY     Patient Active Problem List   Diagnosis Date Noted   History of CVA (cerebrovascular accident) 03/08/2024   Primary open angle glaucoma, left eye 12/30/2023   Chronic kidney disease, stage 3b (HCC) 12/13/2023   Heart failure with mid-range ejection fraction (HFmEF) (HCC) 09/07/2023   Hypothyroidism 01/05/2021   Portal vein thrombosis 01/05/2021   Hypertension 01/29/2012   Hyperlipidemia    Colon polyps     PCP: Stoney Blizzard, DO   REFERRING PROVIDER: Lynwood Barter, DO  REFERRING DIAG: M75.101 (ICD-10-CM) - Tear of right supraspinatus tendon M75.21 (ICD-10-CM) - Tendonitis, bicipital, right M19.011 (ICD-10-CM) - Localized osteoarthritis of right  shoulder  THERAPY DIAG:  Acute pain of right shoulder  Supraspinatus tendinitis, right  Abnormal posture  Muscle weakness (generalized)  Rationale for Evaluation and Treatment: Rehabilitation  ONSET DATE: chronic  SUBJECTIVE:                                                                                                                                                                                      SUBJECTIVE STATEMENT:  Reports participating in increased workload over the weekend which increased shoulder symptoms.  Managed with Tylenol   and is returning to baseline.  Hand dominance: Right  PERTINENT HISTORY: Patient is a pleasant 73 y.o. male who presents today for for follow-up regarding right shoulder pain.  At last office visit on 03/16/2024 with myself patient was diagnosed with high-grade partial-thickness tear of right supraspinatus muscle as well as tenosynovitis of long head of biceps on his right side.  This was confirmed via diagnostic ultrasound evaluation.  Patient wishing to avoid surgery and would like to proceed with conservative management.  Was referred to physical therapy but has unable to schedule first appointment yet.  Has been doing his home exercise program but continues to have pain when reaching overhead or trying to sleep at night.  Interested in steroid injection for pain relief today.  PAIN:  Are you having pain? Yes: NPRS scale: 4/10 Pain location: R shoulder Pain description: ache Aggravating factors: reaching OH and behind  Relieving factors: rest, pain patch, Tylenol   PRECAUTIONS: None  RED FLAGS: None   WEIGHT BEARING RESTRICTIONS: No  FALLS:  Has patient fallen in last 6 months? No  OCCUPATION: Not working  PLOF: Independent  PATIENT GOALS:To manage my shoulder pain w/o surgery  NEXT MD VISIT:   OBJECTIVE:  Note: Objective measures were completed at Evaluation unless otherwise noted.  DIAGNOSTIC FINDINGS:  IMPRESSION:   Complete ultrasound of right shoulder showing evidence of high-grade partial-thickness tearing of supraspinatus as well as bicipital tenosynovitis with surrounding fluid.   Images and interpretation completed by Prentice Agent, DO and Ludie Littler, MD  PATIENT SURVEYS:  [name]: 22/55 QUICK DASH  Please rate your ability do the following activities in the last week by selecting the number below the appropriate response.    (A QuickDASH score may not be calculated if there is greater than 1 missing item.)    Minimally Clinically Important Difference (MCID): 15-20 points  [name], F. et al. (2013). Minimally clinically important difference of the disabilities of the arm, shoulder, and hand outcome measures (DASH) and its shortened version (Quick DASH). Journal of Orthopaedic & Sports Physical Therapy, 44(1), 30-39)    POSTURE: TTP biceps tendon and CA ligament  UPPER EXTREMITY ROM:   A/PROM Right eval Left eval  Shoulder flexion 135/150d   Shoulder extension [facility]   Shoulder abduction 135/150d   Shoulder adduction    Shoulder internal rotation /WFL   Shoulder external rotation /WFL    Elbow flexion    Elbow extension    Wrist flexion    Wrist extension    Wrist ulnar deviation    Wrist radial deviation    Wrist pronation    Wrist supination    (Blank rows = not tested)  UPPER EXTREMITY MMT:  MMT Right eval Left eval  Shoulder flexion 4-   Shoulder extension    Shoulder abduction 4-   Shoulder adduction    Shoulder internal rotation    Shoulder external rotation    Middle trapezius    Lower trapezius    Elbow flexion    Elbow extension    Wrist flexion    Wrist extension    Wrist ulnar deviation    Wrist radial deviation    Wrist pronation    Wrist supination    Grip strength (lbs)    (Blank rows = not tested)  SHOULDER SPECIAL TESTS: Impingement tests: Neer impingement test: positive  and Hawkins/Kennedy impingement test: positive  Rotator  cuff assessment: Drop arm test: negative, Empty can test: negative, Full can test: negative, Belly press test: negative,  and Hornblower's sign: positive                                                                                                                             TREATMENT: [facility] Adult PT Treatment:                                                DATE: 05/16/24 Therapeutic Exercise: Nustep L2 8 min Manual Therapy: R pec minor release 2 min 4 way scapula 15x2  Joint mobs 5x10 distraction, posterior and inferior  PNF D1 F/E 15x supine Therapeutic Activity: Seated hor abd YTB 15x2 Seated ER YTB 15x2 Supine hor abd YTB 15x2  [facility] Adult PT Treatment:                                                DATE: 05/12/24 Therapeutic Exercise: Nustep L2 8 min Manual Therapy: R pec minor release 2 min 4 way scapula 15x2  PROM R shoulder PNF D1 F/E 15x supine  Therapeutic Activity: Seated scaption 15x Seated hor abd YTB 15x Seated ER YTB 15x Supine hor abd YTB 15x B, 15/15  [facility] Adult PT Treatment:                                                DATE: 04/27/24 Eval and HEP Self Care: Additional minutes spent for educating on updated Therapeutic Home Exercise Program as well as comparing current status to condition at start of symptoms. This included exercises focusing on stretching, strengthening, with focus on eccentric aspects. Long term goals include an improvement in range of motion, strength, endurance as well as avoiding reinjury. Patient's frequency would include in 1-2 times a day, 3-5 times a week for a duration of 6-12 weeks. Proper technique shown and discussed handout in great detail. All questions were discussed and addressed.     PATIENT EDUCATION: Education details: Discussed eval findings, rehab rationale and POC and patient is in agreement  Person educated: Patient Education method: Explanation and Handouts Education comprehension: verbalized understanding and needs  further education  HOME EXERCISE PROGRAM: Access Code: [email or web address] URL: [email or web address] Date: 04/27/2024 Prepared by: Isobel Eisenhuth  Exercises - Seated Shoulder Scaption  - 2-3 x daily - 5 x weekly - 1 sets - 10 reps - Seated Shoulder Horizontal Abduction with Resistance  - 2-3 x daily - 5 x weekly - 1 sets - 10 reps - Seated Shoulder W External Rotation on Swiss Ball  - 2-3 x daily - 5 x weekly - 1 sets - 10 reps  ASSESSMENT:  CLINICAL IMPRESSION:  Focus of session was continued posterior shoulder girdle strengthening and postural correction.  Increased reps to build endurance.  Marked postural changes of rounded shoulders and increased kyphosis limit proper form.  Still guards with 4 way scapula and needs VC for full ROM and form.  Patient is a 73 y.o. male  who was seen today for physical therapy evaluation and treatment for R shoulder pain due to underlying supraspinatus partial tear.  Recent CSI has been beneficial, decreasing pain from 7/10 to 4/10.  Patient able to raise RUE OH and behind back with pain.  PROM is WFL, RC appears intact but resisted tasks are painful.  Mild TTP at R [facility] joint, more so at biceps tendon and CA ligament.  Patient is a good rehab candidate to regain shoulder mobility and function.  OBJECTIVE IMPAIRMENTS: decreased activity tolerance, decreased knowledge of condition, decreased ROM, decreased strength, impaired perceived functional ability, impaired UE functional use, postural dysfunction, and pain.   ACTIVITY LIMITATIONS: carrying, lifting, sleeping, and dressing  PERSONAL FACTORS: Age and Past/current experiences are also affecting patient's functional outcome.   REHAB POTENTIAL: Good  CLINICAL DECISION MAKING: Stable/uncomplicated  EVALUATION COMPLEXITY: Moderate   GOALS: Goals reviewed with patient? No  SHORT TERM GOALS: Target date: 05/18/2024   Patient to demonstrate independence in HEP  Baseline: [email or web address] Goal status:  INITIAL  2.  AROM R shoulder 160d flexion and abd Baseline:  A/PROM Right eval Left eval  Shoulder flexion 135/150d   Shoulder extension WFL   Shoulder abduction 135/150d    Goal status: INITIAL    LONG TERM GOALS: Target date: 06/22/2024  Patient will acknowledge <4/10 pain at least once during episode of care  Baseline: 4/10 Goal status: INITIAL  2.  Increase R shoulder strength to 4/5 Baseline:  MMT Right eval Left eval  Shoulder flexion 4-   Shoulder extension    Shoulder abduction 4-    Goal status: INITIAL  3.  Patient will score at least 14/55 on qDASH to signify clinically meaningful improvement in functional abilities.   Baseline: 22/55 Goal status: INITIAL    PLAN:  PT FREQUENCY: 1-2x/week  PT DURATION: 6 weeks  PLANNED INTERVENTIONS: 97110-Therapeutic exercises, 97530- Therapeutic activity, 97112- Neuromuscular re-education, 97535- Self Care, 02859- Manual therapy, and Patient/Family education  PLAN FOR NEXT SESSION: HEP review and update, manual techniques as appropriate, aerobic tasks, ROM and flexibility activities, strengthening and PREs, TPDN, gait and balance training,aquatic therapy, modalities for pain and NMRE    Date of referral: 03/16/2024 Referring provider: Lynwood Barter, DO Referring diagnosis? M75.101 (ICD-10-CM) - Tear of right supraspinatus tendon M75.21 (ICD-10-CM) - Tendonitis, bicipital, right M19.011 (ICD-10-CM) - Localized osteoarthritis of right shoulder Treatment diagnosis? (if different than referring diagnosis) Partial-thickness tearing of right supraspinatus and right bicipital tendinitis  What was this (referring dx) caused by? Surgery (Type: lifting)  Nature of Condition: Initial Onset (within last 3 months)   Laterality: Rt  Current Functional Measure Score: DASH 22/55  Objective measurements identify impairments when they are compared to normal values, the uninvolved extremity, and prior level of function.  [x]   Yes  []  No  Objective assessment of functional ability: Moderate functional limitations   Briefly describe symptoms: pain with OH and behind the back motions  How did symptoms start: lifting  Average pain intensity:  Last 24 hours: 4  Past week: 4  How often does the pt experience symptoms? Frequently  How much have the symptoms interfered with usual daily activities? Moderately  How has condition changed since care began at this facility? NA - initial visit  In general, how is the patients overall health? Good   BACK PAIN (STarT Back Screening Tool) No   Reyes CHRISTELLA Kohut, PT 05/16/2024, 1:52 PM

## 2024-05-16 ENCOUNTER — Ambulatory Visit

## 2024-05-16 DIAGNOSIS — M7591 Shoulder lesion, unspecified, right shoulder: Secondary | ICD-10-CM

## 2024-05-16 DIAGNOSIS — R293 Abnormal posture: Secondary | ICD-10-CM

## 2024-05-16 DIAGNOSIS — M6281 Muscle weakness (generalized): Secondary | ICD-10-CM

## 2024-05-16 DIAGNOSIS — M25511 Pain in right shoulder: Secondary | ICD-10-CM

## 2024-05-18 ENCOUNTER — Ambulatory Visit

## 2024-05-18 DIAGNOSIS — R293 Abnormal posture: Secondary | ICD-10-CM

## 2024-05-18 DIAGNOSIS — M7591 Shoulder lesion, unspecified, right shoulder: Secondary | ICD-10-CM

## 2024-05-18 DIAGNOSIS — M25511 Pain in right shoulder: Secondary | ICD-10-CM

## 2024-05-18 NOTE — Therapy (Signed)
 OUTPATIENT PHYSICAL THERAPY SHOULDER EVALUATION   Patient Name: [name] MRN: [id number] DOB:[birth year], 73 y.o., male Today's Date: 05/18/2024  END OF SESSION:  PT End of Session - 05/18/24 1341     Visit Number 4    Number of Visits 12    Date for Recertification  06/28/23    Authorization Type [employer] MCR    PT Start Time 1340    PT Stop Time 1420    PT Time Calculation (min) 40 min    Activity Tolerance Patient tolerated treatment well    Behavior During Therapy WFL for tasks assessed/performed             Past Medical History:  Diagnosis Date   Acute renal failure superimposed on stage 3b chronic kidney disease (HCC) 09/07/2023   Chronic kidney disease, stage 3 (HCC)    Chronic pancreatitis (HCC)    Colon polyps    GERD (gastroesophageal reflux disease)    Hyperlipidemia    Hypertension    Hypothyroidism    Portal vein thrombosis    previously on Coumadin, no longer requiring anticoag   Prediabetes    Rib fractures 09/05/2023   Stroke (HCC)    2004, L sided weakness (no residual)   Syncope 01/04/2021   Vitamin D  deficiency    Past Surgical History:  Procedure Laterality Date   CHOLECYSTECTOMY     NECK SURGERY     bone spurs   SHOULDER ARTHROSCOPY Right 12/07/2013   THYROIDECTOMY     Patient Active Problem List   Diagnosis Date Noted   History of CVA (cerebrovascular accident) 03/08/2024   Primary open angle glaucoma, left eye 12/30/2023   Chronic kidney disease, stage 3b (HCC) 12/13/2023   Heart failure with mid-range ejection fraction (HFmEF) (HCC) 09/07/2023   Hypothyroidism 01/05/2021   Portal vein thrombosis 01/05/2021   Hypertension 01/29/2012   Hyperlipidemia    Colon polyps     PCP: Stoney Blizzard, DO   REFERRING PROVIDER: Lynwood Barter, DO  REFERRING DIAG: M75.101 (ICD-10-CM) - Tear of right supraspinatus tendon M75.21 (ICD-10-CM) - Tendonitis, bicipital, right M19.011 (ICD-10-CM) - Localized osteoarthritis of right  shoulder  THERAPY DIAG:  Acute pain of right shoulder  Supraspinatus tendinitis, right  Abnormal posture  Rationale for Evaluation and Treatment: Rehabilitation  ONSET DATE: chronic  SUBJECTIVE:                                                                                                                                                                                      SUBJECTIVE STATEMENT:  Had an increase in anterior R shoulder pain following last session and needed to apply pain patches and resort  to Tylenol  for pain relief.  Symptoms close to baseline now.  Hand dominance: Right  PERTINENT HISTORY: Patient is a pleasant 73 y.o. male who presents today for for follow-up regarding right shoulder pain.  At last office visit on 03/16/2024 with myself patient was diagnosed with high-grade partial-thickness tear of right supraspinatus muscle as well as tenosynovitis of long head of biceps on his right side.  This was confirmed via diagnostic ultrasound evaluation.  Patient wishing to avoid surgery and would like to proceed with conservative management.  Was referred to physical therapy but has unable to schedule first appointment yet.  Has been doing his home exercise program but continues to have pain when reaching overhead or trying to sleep at night.  Interested in steroid injection for pain relief today.  PAIN:  Are you having pain? Yes: NPRS scale: 4/10 Pain location: R shoulder Pain description: ache Aggravating factors: reaching OH and behind  Relieving factors: rest, pain patch, Tylenol   PRECAUTIONS: None  RED FLAGS: None   WEIGHT BEARING RESTRICTIONS: No  FALLS:  Has patient fallen in last 6 months? No  OCCUPATION: Not working  PLOF: Independent  PATIENT GOALS:To manage my shoulder pain w/o surgery  NEXT MD VISIT:   OBJECTIVE:  Note: Objective measures were completed at Evaluation unless otherwise noted.  DIAGNOSTIC FINDINGS:  IMPRESSION:  Complete  ultrasound of right shoulder showing evidence of high-grade partial-thickness tearing of supraspinatus as well as bicipital tenosynovitis with surrounding fluid.   Images and interpretation completed by Prentice Agent, DO and Ludie Littler, MD  PATIENT SURVEYS:  [name]: 22/55 QUICK DASH  Please rate your ability do the following activities in the last week by selecting the number below the appropriate response.    (A QuickDASH score may not be calculated if there is greater than 1 missing item.)    Minimally Clinically Important Difference (MCID): 15-20 points  [name], F. et al. (2013). Minimally clinically important difference of the disabilities of the arm, shoulder, and hand outcome measures (DASH) and its shortened version (Quick DASH). Journal of Orthopaedic & Sports Physical Therapy, 44(1), 30-39)    POSTURE: TTP biceps tendon and CA ligament  UPPER EXTREMITY ROM:   A/PROM Right eval Left eval  Shoulder flexion 135/150d   Shoulder extension [facility]   Shoulder abduction 135/150d   Shoulder adduction    Shoulder internal rotation /WFL   Shoulder external rotation /WFL    Elbow flexion    Elbow extension    Wrist flexion    Wrist extension    Wrist ulnar deviation    Wrist radial deviation    Wrist pronation    Wrist supination    (Blank rows = not tested)  UPPER EXTREMITY MMT:  MMT Right eval Left eval  Shoulder flexion 4-   Shoulder extension    Shoulder abduction 4-   Shoulder adduction    Shoulder internal rotation    Shoulder external rotation    Middle trapezius    Lower trapezius    Elbow flexion    Elbow extension    Wrist flexion    Wrist extension    Wrist ulnar deviation    Wrist radial deviation    Wrist pronation    Wrist supination    Grip strength (lbs)    (Blank rows = not tested)  SHOULDER SPECIAL TESTS: Impingement tests: Neer impingement test: positive  and Hawkins/Kennedy impingement test: positive  Rotator cuff  assessment: Drop arm test: negative, Empty can test: negative, Full can  test: negative, Belly press test: negative, and Hornblower's sign: positive                                                                                                                             TREATMENT: [facility] Adult PT Treatment:                                                DATE: 05/18/24 Therapeutic Exercise: Nustep L2 8 min Manual Therapy: 4 way scapula 15x2  PNF D1 F/E 15x supine Therapeutic Activity: Doorway stretch 30s x3 Seated thoracic extension self mobs 3x10 Seated hor abd YTB 15x2 Seated ER YTB 15x2 Supine hor abd YTB 15x2 Supine OH flexion 1# 15/15  [facility] Adult PT Treatment:                                                DATE: 05/16/24 Therapeutic Exercise: Nustep L2 8 min Manual Therapy: 4 way scapula 15x2  PNF D1 F/E 15x supine Therapeutic Activity: Seated hor abd YTB 15x2 Seated ER YTB 15x2 Supine hor abd YTB 15x2  [facility] Adult PT Treatment:                                                DATE: 05/12/24 Therapeutic Exercise: Nustep L2 8 min Manual Therapy: R pec minor release 2 min 4 way scapula 15x2  PROM R shoulder PNF D1 F/E 15x supine  Therapeutic Activity: Seated scaption 15x Seated hor abd YTB 15x Seated ER YTB 15x Supine hor abd YTB 15x B, 15/15  [facility] Adult PT Treatment:                                                DATE: 04/27/24 Eval and HEP Self Care: Additional minutes spent for educating on updated Therapeutic Home Exercise Program as well as comparing current status to condition at start of symptoms. This included exercises focusing on stretching, strengthening, with focus on eccentric aspects. Long term goals include an improvement in range of motion, strength, endurance as well as avoiding reinjury. Patient's frequency would include in 1-2 times a day, 3-5 times a week for a duration of 6-12 weeks. Proper technique shown and discussed handout in great detail. All  questions were discussed and addressed.     PATIENT EDUCATION: Education details: Discussed eval findings, rehab rationale and POC and patient is in agreement  Person educated: Patient Education method:  Explanation and Handouts Education comprehension: verbalized understanding and needs further education  HOME EXERCISE PROGRAM: Access Code: [email or web address] URL: [email or web address] Date: 05/18/2024 Prepared by: Aslan Himes  Exercises - Seated Shoulder Scaption  - 2-3 x daily - 5 x weekly - 1 sets - 10 reps - Seated Shoulder Horizontal Abduction with Resistance  - 2-3 x daily - 5 x weekly - 1 sets - 10 reps - Seated Shoulder W External Rotation on Swiss Ball  - 2-3 x daily - 5 x weekly - 1 sets - 10 reps - Doorway Pec Stretch at 90 Degrees Abduction  - 2-3 x daily - 5 x weekly - 1 sets - 3 reps - 30s hold  ASSESSMENT:  CLINICAL IMPRESSION:  Modified session to minimize post treatment soreness.  Refrained from joint mobs.  Incorporated additional stretching and posture modification tasks and updated HEP.  Appears to have more ROM with PNF patterns  Patient is a 73 y.o. male  who was seen today for physical therapy evaluation and treatment for R shoulder pain due to underlying supraspinatus partial tear.  Recent CSI has been beneficial, decreasing pain from 7/10 to 4/10.  Patient able to raise RUE OH and behind back with pain.  PROM is WFL, RC appears intact but resisted tasks are painful.  Mild TTP at R [facility] joint, more so at biceps tendon and CA ligament.  Patient is a good rehab candidate to regain shoulder mobility and function.  OBJECTIVE IMPAIRMENTS: decreased activity tolerance, decreased knowledge of condition, decreased ROM, decreased strength, impaired perceived functional ability, impaired UE functional use, postural dysfunction, and pain.   ACTIVITY LIMITATIONS: carrying, lifting, sleeping, and dressing  PERSONAL FACTORS: Age and Past/current experiences are also  affecting patient's functional outcome.   REHAB POTENTIAL: Good  CLINICAL DECISION MAKING: Stable/uncomplicated  EVALUATION COMPLEXITY: Moderate   GOALS: Goals reviewed with patient? No  SHORT TERM GOALS: Target date: 05/18/2024   Patient to demonstrate independence in HEP  Baseline: [email or web address] Goal status: INITIAL  2.  AROM R shoulder 160d flexion and abd Baseline:  A/PROM Right eval Left eval  Shoulder flexion 135/150d   Shoulder extension WFL   Shoulder abduction 135/150d    Goal status: INITIAL    LONG TERM GOALS: Target date: 06/22/2024  Patient will acknowledge <4/10 pain at least once during episode of care  Baseline: 4/10 Goal status: INITIAL  2.  Increase R shoulder strength to 4/5 Baseline:  MMT Right eval Left eval  Shoulder flexion 4-   Shoulder extension    Shoulder abduction 4-    Goal status: INITIAL  3.  Patient will score at least 14/55 on qDASH to signify clinically meaningful improvement in functional abilities.   Baseline: 22/55 Goal status: INITIAL    PLAN:  PT FREQUENCY: 1-2x/week  PT DURATION: 6 weeks  PLANNED INTERVENTIONS: 97110-Therapeutic exercises, 97530- Therapeutic activity, 97112- Neuromuscular re-education, 97535- Self Care, [id number]- Manual therapy, and Patient/Family education  PLAN FOR NEXT SESSION: HEP review and update, manual techniques as appropriate, aerobic tasks, ROM and flexibility activities, strengthening and PREs, TPDN, gait and balance training,aquatic therapy, modalities for pain and NMRE    Date of referral: 03/16/2024 Referring provider: Lynwood Barter, DO Referring diagnosis? M75.101 (ICD-10-CM) - Tear of right supraspinatus tendon M75.21 (ICD-10-CM) - Tendonitis, bicipital, right M19.011 (ICD-10-CM) - Localized osteoarthritis of right shoulder Treatment diagnosis? (if different than referring diagnosis) Partial-thickness tearing of right supraspinatus and right bicipital tendinitis  What was this  (referring dx) caused by? Surgery (Type: lifting)  Lysle  of Condition: Initial Onset (within last 3 months)   Laterality: Rt  Current Functional Measure Score: DASH 22/55  Objective measurements identify impairments when they are compared to normal values, the uninvolved extremity, and prior level of function.  [x]  Yes  []  No  Objective assessment of functional ability: Moderate functional limitations   Briefly describe symptoms: pain with OH and behind the back motions  How did symptoms start: lifting  Average pain intensity:  Last 24 hours: 4  Past week: 4  How often does the pt experience symptoms? Frequently  How much have the symptoms interfered with usual daily activities? Moderately  How has condition changed since care began at this facility? NA - initial visit  In general, how is the patients overall health? Good   BACK PAIN (STarT Back Screening Tool) No   Reyes CHRISTELLA Kohut, PT 05/18/2024, 2:37 PM

## 2024-05-22 ENCOUNTER — Ambulatory Visit

## 2024-05-22 DIAGNOSIS — M25511 Pain in right shoulder: Secondary | ICD-10-CM

## 2024-05-22 DIAGNOSIS — R293 Abnormal posture: Secondary | ICD-10-CM

## 2024-05-22 DIAGNOSIS — M7591 Shoulder lesion, unspecified, right shoulder: Secondary | ICD-10-CM

## 2024-05-22 NOTE — Therapy (Addendum)
 " OUTPATIENT PHYSICAL THERAPY TREATMENT NOTE/DISCHARGE   Patient Name: [name] MRN: [id number] DOB:1951-02-14, 73 y.o., male Today's Date: 05/25/2024 PHYSICAL THERAPY DISCHARGE SUMMARY  Visits from Start of Care: 6  Current functional level related to goals / functional outcomes: Goals partially met   Remaining deficits: Pain, weakness, posture   Education / Equipment: HEP   Patient agrees to discharge. Patient goals were partially met. Patient is being discharged due to did not respond to therapy.   END OF SESSION:  PT End of Session - 05/25/24 1403     Visit Number 6    Number of Visits 12    Date for Recertification  06/28/23    Authorization Type [employer] MCR    PT Start Time 1400    PT Stop Time 1440    PT Time Calculation (min) 40 min    Activity Tolerance Patient tolerated treatment well    Behavior During Therapy WFL for tasks assessed/performed               Past Medical History:  Diagnosis Date   Acute renal failure superimposed on stage 3b chronic kidney disease (HCC) 09/07/2023   Chronic kidney disease, stage 3 (HCC)    Chronic pancreatitis (HCC)    Colon polyps    GERD (gastroesophageal reflux disease)    Hyperlipidemia    Hypertension    Hypothyroidism    Portal vein thrombosis    previously on Coumadin, no longer requiring anticoag   Prediabetes    Rib fractures 09/05/2023   Stroke (HCC)    2004, L sided weakness (no residual)   Syncope 01/04/2021   Vitamin D  deficiency    Past Surgical History:  Procedure Laterality Date   CHOLECYSTECTOMY     NECK SURGERY     bone spurs   SHOULDER ARTHROSCOPY Right 12/07/2013   THYROIDECTOMY     Patient Active Problem List   Diagnosis Date Noted   History of CVA (cerebrovascular accident) 03/08/2024   Primary open angle glaucoma, left eye 12/30/2023   Chronic kidney disease, stage 3b (HCC) 12/13/2023   Heart failure with mid-range ejection fraction (HFmEF) (HCC) 09/07/2023    Hypothyroidism 01/05/2021   Portal vein thrombosis 01/05/2021   Hypertension 01/29/2012   Hyperlipidemia    Colon polyps     PCP: Guy Blizzard, DO   REFERRING PROVIDER: Lynwood Barter, DO  REFERRING DIAG: M75.101 (ICD-10-CM) - Tear of right supraspinatus tendon M75.21 (ICD-10-CM) - Tendonitis, bicipital, right M19.011 (ICD-10-CM) - Localized osteoarthritis of right shoulder  THERAPY DIAG:  Acute pain of right shoulder  Supraspinatus tendinitis, right  Abnormal posture  Rationale for Evaluation and Treatment: Rehabilitation  ONSET DATE: chronic  SUBJECTIVE:  SUBJECTIVE STATEMENT:  Continued 5/10 shoulder pain at worst.  Still reports symptoms with OH and low reaching tasks.  Notes a mild increase in discomfort following PT sessions and needs OTC meds/patches to manage symptoms.  Hand dominance: Right  PERTINENT HISTORY: Patient is a pleasant 73 y.o. male who presents today for for follow-up regarding right shoulder pain.  At last office visit on 03/16/2024 with myself patient was diagnosed with high-grade partial-thickness tear of right supraspinatus muscle as well as tenosynovitis of long head of biceps on his right side.  This was confirmed via diagnostic ultrasound evaluation.  Patient wishing to avoid surgery and would like to proceed with conservative management.  Was referred to physical therapy but has unable to schedule first appointment yet.  Has been doing his home exercise program but continues to have pain when reaching overhead or trying to sleep at night.  Interested in steroid injection for pain relief today.  PAIN:  Are you having pain? Yes: NPRS scale: 4/10 Pain location: R shoulder Pain description: ache Aggravating factors: reaching OH and behind  Relieving factors: rest, pain  patch, Tylenol   PRECAUTIONS: None  RED FLAGS: None   WEIGHT BEARING RESTRICTIONS: No  FALLS:  Has patient fallen in last 6 months? No  OCCUPATION: Not working  PLOF: Independent  PATIENT GOALS:To manage my shoulder pain w/o surgery  NEXT MD VISIT:   OBJECTIVE:  Note: Objective measures were completed at Evaluation unless otherwise noted.  DIAGNOSTIC FINDINGS:  IMPRESSION:  Complete ultrasound of right shoulder showing evidence of high-grade partial-thickness tearing of supraspinatus as well as bicipital tenosynovitis with surrounding fluid.   Images and interpretation completed by Guy Agent, DO and Guy Littler, MD  PATIENT SURVEYS:  [name]: 22/55 QUICK DASH  Please rate your ability do the following activities in the last week by selecting the number below the appropriate response.    (A QuickDASH score may not be calculated if there is greater than 1 missing item.)    Minimally Clinically Important Difference (MCID): 15-20 points  [name], F. et al. (2013). Minimally clinically important difference of the disabilities of the arm, shoulder, and hand outcome measures (DASH) and its shortened version (Quick DASH). Journal of Orthopaedic & Sports Physical Therapy, 44(1), 30-39)    POSTURE: TTP biceps tendon and CA ligament  UPPER EXTREMITY ROM:   A/PROM Right eval Left eval  Shoulder flexion 135/150d   Shoulder extension [facility]   Shoulder abduction 135/150d   Shoulder adduction    Shoulder internal rotation /WFL   Shoulder external rotation /WFL    Elbow flexion    Elbow extension    Wrist flexion    Wrist extension    Wrist ulnar deviation    Wrist radial deviation    Wrist pronation    Wrist supination    (Blank rows = not tested)  UPPER EXTREMITY MMT:  MMT Right eval Left eval  Shoulder flexion 4-   Shoulder extension    Shoulder abduction 4-   Shoulder adduction    Shoulder internal rotation    Shoulder external rotation     Middle trapezius    Lower trapezius    Elbow flexion    Elbow extension    Wrist flexion    Wrist extension    Wrist ulnar deviation    Wrist radial deviation    Wrist pronation    Wrist supination    Grip strength (lbs)    (Blank rows = not tested)  SHOULDER SPECIAL TESTS: Impingement  tests: Neer impingement test: positive  and Hawkins/Kennedy impingement test: positive  Rotator cuff assessment: Drop arm test: negative, Empty can test: negative, Full can test: negative, Belly press test: negative, and Hornblower's sign: positive                                                                                                                             TREATMENT: [facility] Adult PT Treatment:                                                DATE: 05/25/24 Therapeutic Exercise: Nustep L4 8 min  Therapeutic Activity: Doorway stretch 30s x3 Seated thoracic extension self mobs 3x10 Seated hor abd RTB 15x2 Seated ER RTB 15x2 Supine hor abd RTB 15x B, 15/15 Supine OH flexion 2# 15/15 Standing open book 10/10 Suitcase carry 5# KB 2 laps  [facility] Adult PT Treatment:                                                DATE: 05/22/24 Therapeutic Exercise: Nustep L3 8 min Neuromuscular re-ed: PNF D1 F/E 15x supine Standing open book 10/10 Therapeutic Activity: Doorway stretch 30s x3 Seated thoracic extension self mobs 3x10 Seated hor abd RTB 15x2 Seated ER RTB 15x2 Supine hor abd RTB 15x2 Supine OH flexion 1# 15/15  [facility] Adult PT Treatment:                                                DATE: 05/18/24 Therapeutic Exercise: Nustep L2 8 min Manual Therapy: 4 way scapula 15x2  PNF D1 F/E 15x supine Therapeutic Activity: Doorway stretch 30s x3 Seated thoracic extension self mobs 3x10 Seated hor abd YTB 15x2 Seated ER YTB 15x2 Supine hor abd YTB 15x2 Supine OH flexion 1# 15/15  [facility] Adult PT Treatment:                                                DATE: 05/16/24 Therapeutic  Exercise: Nustep L2 8 min Manual Therapy: 4 way scapula 15x2  PNF D1 F/E 15x supine Therapeutic Activity: Seated hor abd YTB 15x2 Seated ER YTB 15x2 Supine hor abd YTB 15x2  [facility] Adult PT Treatment:  DATE: 05/12/24 Therapeutic Exercise: Nustep L2 8 min Manual Therapy: R pec minor release 2 min 4 way scapula 15x2  PROM R shoulder PNF D1 F/E 15x supine  Therapeutic Activity: Seated scaption 15x Seated hor abd YTB 15x Seated ER YTB 15x Supine hor abd YTB 15x B, 15/15  [facility] Adult PT Treatment:                                                DATE: 04/27/24 Eval and HEP Self Care: Additional minutes spent for educating on updated Therapeutic Home Exercise Program as well as comparing current status to condition at start of symptoms. This included exercises focusing on stretching, strengthening, with focus on eccentric aspects. Long term goals include an improvement in range of motion, strength, endurance as well as avoiding reinjury. Patient's frequency would include in 1-2 times a day, 3-5 times a week for a duration of 6-12 weeks. Proper technique shown and discussed handout in great detail. All questions were discussed and addressed.     PATIENT EDUCATION: Education details: Discussed eval findings, rehab rationale and POC and patient is in agreement  Person educated: Patient Education method: Explanation and Handouts Education comprehension: verbalized understanding and needs further education  HOME EXERCISE PROGRAM: Access Code: [email or web address] URL: [email or web address] Date: 05/18/2024 Prepared by: Doreather Hoxworth  Exercises - Seated Shoulder Scaption  - 2-3 x daily - 5 x weekly - 1 sets - 10 reps - Seated Shoulder Horizontal Abduction with Resistance  - 2-3 x daily - 5 x weekly - 1 sets - 10 reps - Seated Shoulder W External Rotation on Swiss Ball  - 2-3 x daily - 5 x weekly - 1 sets - 10 reps - Doorway Pec Stretch at  90 Degrees Abduction  - 2-3 x daily - 5 x weekly - 1 sets - 3 reps - 30s hold  ASSESSMENT:  CLINICAL IMPRESSION:  Continued moderate pain levels.  Still experiences post treatment discomfort.  Rounded shoulder posture still prominent and frequent VC needed to correct during session.  Overall minimal gains in pain and function. Added additional functional tasks and increased resistance to push patient.  Patient is a 73 y.o. male  who was seen today for physical therapy evaluation and treatment for R shoulder pain due to underlying supraspinatus partial tear.  Recent CSI has been beneficial, decreasing pain from 7/10 to 4/10.  Patient able to raise RUE OH and behind back with pain.  PROM is WFL, RC appears intact but resisted tasks are painful.  Mild TTP at R [facility] joint, more so at biceps tendon and CA ligament.  Patient is a good rehab candidate to regain shoulder mobility and function.  OBJECTIVE IMPAIRMENTS: decreased activity tolerance, decreased knowledge of condition, decreased ROM, decreased strength, impaired perceived functional ability, impaired UE functional use, postural dysfunction, and pain.   ACTIVITY LIMITATIONS: carrying, lifting, sleeping, and dressing  PERSONAL FACTORS: Age and Past/current experiences are also affecting patient's functional outcome.   REHAB POTENTIAL: Good  CLINICAL DECISION MAKING: Stable/uncomplicated  EVALUATION COMPLEXITY: Moderate   GOALS: Goals reviewed with patient? No  SHORT TERM GOALS: Target date: 05/18/2024   Patient to demonstrate independence in HEP  Baseline: [email or web address] Goal status: INITIAL  2.  AROM R shoulder 160d flexion and abd Baseline:  A/PROM Right eval Left eval  Shoulder flexion 135/150d   Shoulder extension [facility]  Shoulder abduction 135/150d    Goal status: INITIAL    LONG TERM GOALS: Target date: 06/22/2024  Patient will acknowledge <4/10 pain at least once during episode of care  Baseline: 4/10 Goal status:  INITIAL  2.  Increase R shoulder strength to 4/5 Baseline:  MMT Right eval Left eval  Shoulder flexion 4-   Shoulder extension    Shoulder abduction 4-    Goal status: INITIAL  3.  Patient will score at least 14/55 on qDASH to signify clinically meaningful improvement in functional abilities.   Baseline: 22/55 Goal status: INITIAL    PLAN:  PT FREQUENCY: 1-2x/week  PT DURATION: 6 weeks  PLANNED INTERVENTIONS: 97110-Therapeutic exercises, 97530- Therapeutic activity, 97112- Neuromuscular re-education, 97535- Self Care, [id number]- Manual therapy, and Patient/Family education  PLAN FOR NEXT SESSION: HEP review and update, manual techniques as appropriate, aerobic tasks, ROM and flexibility activities, strengthening and PREs, TPDN, gait and balance training,aquatic therapy, modalities for pain and NMRE    Date of referral: 03/16/2024 Referring provider: Lynwood Barter, DO Referring diagnosis? M75.101 (ICD-10-CM) - Tear of right supraspinatus tendon M75.21 (ICD-10-CM) - Tendonitis, bicipital, right M19.011 (ICD-10-CM) - Localized osteoarthritis of right shoulder Treatment diagnosis? (if different than referring diagnosis) Partial-thickness tearing of right supraspinatus and right bicipital tendinitis  What was this (referring dx) caused by? Surgery (Type: lifting)  Nature of Condition: Initial Onset (within last 3 months)   Laterality: Rt  Current Functional Measure Score: DASH 22/55  Objective measurements identify impairments when they are compared to normal values, the uninvolved extremity, and prior level of function.  [x]  Yes  []  No  Objective assessment of functional ability: Moderate functional limitations   Briefly describe symptoms: pain with OH and behind the back motions  How did symptoms start: lifting  Average pain intensity:  Last 24 hours: 4  Past week: 4  How often does the pt experience symptoms? Frequently  How much have the symptoms interfered with  usual daily activities? Moderately  How has condition changed since care began at this facility? NA - initial visit  In general, how is the patients overall health? Good   BACK PAIN (STarT Back Screening Tool) No   Octavia Velador M Demetrius Mahler, PT 05/25/2024, 2:54 PM  "

## 2024-05-22 NOTE — Therapy (Signed)
 OUTPATIENT PHYSICAL THERAPY SHOULDER EVALUATION   Patient Name: [name] MRN: [id number] DOB:May 18, 1951, 73 y.o., male Today's Date: 05/22/2024  END OF SESSION:  PT End of Session - 05/22/24 1047     Visit Number 5    Number of Visits 12    Date for Recertification  06/28/23    Authorization Type [employer] MCR    PT Start Time 1045    PT Stop Time 1125    PT Time Calculation (min) 40 min    Activity Tolerance Patient tolerated treatment well    Behavior During Therapy WFL for tasks assessed/performed              Past Medical History:  Diagnosis Date   Acute renal failure superimposed on stage 3b chronic kidney disease (HCC) 09/07/2023   Chronic kidney disease, stage 3 (HCC)    Chronic pancreatitis (HCC)    Colon polyps    GERD (gastroesophageal reflux disease)    Hyperlipidemia    Hypertension    Hypothyroidism    Portal vein thrombosis    previously on Coumadin, no longer requiring anticoag   Prediabetes    Rib fractures 09/05/2023   Stroke (HCC)    2004, L sided weakness (no residual)   Syncope 01/04/2021   Vitamin D  deficiency    Past Surgical History:  Procedure Laterality Date   CHOLECYSTECTOMY     NECK SURGERY     bone spurs   SHOULDER ARTHROSCOPY Right 12/07/2013   THYROIDECTOMY     Patient Active Problem List   Diagnosis Date Noted   History of CVA (cerebrovascular accident) 03/08/2024   Primary open angle glaucoma, left eye 12/30/2023   Chronic kidney disease, stage 3b (HCC) 12/13/2023   Heart failure with mid-range ejection fraction (HFmEF) (HCC) 09/07/2023   Hypothyroidism 01/05/2021   Portal vein thrombosis 01/05/2021   Hypertension 01/29/2012   Hyperlipidemia    Colon polyps     PCP: Stoney Blizzard, DO   REFERRING PROVIDER: Lynwood Barter, DO  REFERRING DIAG: M75.101 (ICD-10-CM) - Tear of right supraspinatus tendon M75.21 (ICD-10-CM) - Tendonitis, bicipital, right M19.011 (ICD-10-CM) - Localized osteoarthritis of right  shoulder  THERAPY DIAG:  Acute pain of right shoulder  Supraspinatus tendinitis, right  Abnormal posture  Rationale for Evaluation and Treatment: Rehabilitation  ONSET DATE: chronic  SUBJECTIVE:                                                                                                                                                                                      SUBJECTIVE STATEMENT:  Continues with persistent anterior shoulder pain.  Last session not as painful as previous session.  Hand dominance:  Right  PERTINENT HISTORY: Patient is a pleasant 73 y.o. male who presents today for for follow-up regarding right shoulder pain.  At last office visit on 03/16/2024 with myself patient was diagnosed with high-grade partial-thickness tear of right supraspinatus muscle as well as tenosynovitis of long head of biceps on his right side.  This was confirmed via diagnostic ultrasound evaluation.  Patient wishing to avoid surgery and would like to proceed with conservative management.  Was referred to physical therapy but has unable to schedule first appointment yet.  Has been doing his home exercise program but continues to have pain when reaching overhead or trying to sleep at night.  Interested in steroid injection for pain relief today.  PAIN:  Are you having pain? Yes: NPRS scale: 4/10 Pain location: R shoulder Pain description: ache Aggravating factors: reaching OH and behind  Relieving factors: rest, pain patch, Tylenol   PRECAUTIONS: None  RED FLAGS: None   WEIGHT BEARING RESTRICTIONS: No  FALLS:  Has patient fallen in last 6 months? No  OCCUPATION: Not working  PLOF: Independent  PATIENT GOALS:To manage my shoulder pain w/o surgery  NEXT MD VISIT:   OBJECTIVE:  Note: Objective measures were completed at Evaluation unless otherwise noted.  DIAGNOSTIC FINDINGS:  IMPRESSION:  Complete ultrasound of right shoulder showing evidence of high-grade partial-thickness  tearing of supraspinatus as well as bicipital tenosynovitis with surrounding fluid.   Images and interpretation completed by Prentice Agent, DO and Ludie Littler, MD  PATIENT SURVEYS:  [name]: 22/55 QUICK DASH  Please rate your ability do the following activities in the last week by selecting the number below the appropriate response.    (A QuickDASH score may not be calculated if there is greater than 1 missing item.)    Minimally Clinically Important Difference (MCID): 15-20 points  [name], F. et al. (2013). Minimally clinically important difference of the disabilities of the arm, shoulder, and hand outcome measures (DASH) and its shortened version (Quick DASH). Journal of Orthopaedic & Sports Physical Therapy, 44(1), 30-39)    POSTURE: TTP biceps tendon and CA ligament  UPPER EXTREMITY ROM:   A/PROM Right eval Left eval  Shoulder flexion 135/150d   Shoulder extension [facility]   Shoulder abduction 135/150d   Shoulder adduction    Shoulder internal rotation /WFL   Shoulder external rotation /WFL    Elbow flexion    Elbow extension    Wrist flexion    Wrist extension    Wrist ulnar deviation    Wrist radial deviation    Wrist pronation    Wrist supination    (Blank rows = not tested)  UPPER EXTREMITY MMT:  MMT Right eval Left eval  Shoulder flexion 4-   Shoulder extension    Shoulder abduction 4-   Shoulder adduction    Shoulder internal rotation    Shoulder external rotation    Middle trapezius    Lower trapezius    Elbow flexion    Elbow extension    Wrist flexion    Wrist extension    Wrist ulnar deviation    Wrist radial deviation    Wrist pronation    Wrist supination    Grip strength (lbs)    (Blank rows = not tested)  SHOULDER SPECIAL TESTS: Impingement tests: Neer impingement test: positive  and Hawkins/Kennedy impingement test: positive  Rotator cuff assessment: Drop arm test: negative, Empty can test: negative, Full can test:  negative, Belly press test: negative, and Hornblower's sign: positive  TREATMENT: [facility] Adult PT Treatment:                                                DATE: 05/22/24 Therapeutic Exercise: Nustep L3 8 min Neuromuscular re-ed: PNF D1 F/E 15x supine Standing open book 10/10 Therapeutic Activity: Doorway stretch 30s x3 Seated thoracic extension self mobs 3x10 Seated hor abd RTB 15x2 Seated ER RTB 15x2 Supine hor abd RTB 15x2 Supine OH flexion 1# 15/15  [facility] Adult PT Treatment:                                                DATE: 05/18/24 Therapeutic Exercise: Nustep L2 8 min Manual Therapy: 4 way scapula 15x2  PNF D1 F/E 15x supine Therapeutic Activity: Doorway stretch 30s x3 Seated thoracic extension self mobs 3x10 Seated hor abd YTB 15x2 Seated ER YTB 15x2 Supine hor abd YTB 15x2 Supine OH flexion 1# 15/15  [facility] Adult PT Treatment:                                                DATE: 05/16/24 Therapeutic Exercise: Nustep L2 8 min Manual Therapy: 4 way scapula 15x2  PNF D1 F/E 15x supine Therapeutic Activity: Seated hor abd YTB 15x2 Seated ER YTB 15x2 Supine hor abd YTB 15x2  [facility] Adult PT Treatment:                                                DATE: 05/12/24 Therapeutic Exercise: Nustep L2 8 min Manual Therapy: R pec minor release 2 min 4 way scapula 15x2  PROM R shoulder PNF D1 F/E 15x supine  Therapeutic Activity: Seated scaption 15x Seated hor abd YTB 15x Seated ER YTB 15x Supine hor abd YTB 15x B, 15/15  [facility] Adult PT Treatment:                                                DATE: 04/27/24 Eval and HEP Self Care: Additional minutes spent for educating on updated Therapeutic Home Exercise Program as well as comparing current status to condition at start of symptoms. This included exercises focusing on stretching, strengthening,  with focus on eccentric aspects. Long term goals include an improvement in range of motion, strength, endurance as well as avoiding reinjury. Patient's frequency would include in 1-2 times a day, 3-5 times a week for a duration of 6-12 weeks. Proper technique shown and discussed handout in great detail. All questions were discussed and addressed.     PATIENT EDUCATION: Education details: Discussed eval findings, rehab rationale and POC and patient is in agreement  Person educated: Patient Education method: Explanation and Handouts Education comprehension: verbalized understanding and needs further education  HOME EXERCISE PROGRAM: Access Code: [email or web address] URL: [email or web address] Date: 05/18/2024 Prepared by: Reyes Kohut  Exercises - Seated Shoulder Scaption  -  2-3 x daily - 5 x weekly - 1 sets - 10 reps - Seated Shoulder Horizontal Abduction with Resistance  - 2-3 x daily - 5 x weekly - 1 sets - 10 reps - Seated Shoulder W External Rotation on Swiss Ball  - 2-3 x daily - 5 x weekly - 1 sets - 10 reps - Doorway Pec Stretch at 90 Degrees Abduction  - 2-3 x daily - 5 x weekly - 1 sets - 3 reps - 30s hold  ASSESSMENT:  CLINICAL IMPRESSION:  Increased resistance as noted.  Added open book to promote thoracic mobility and address posture.  Emphasized postural correction and role in resolving symptoms.    Patient is a 73 y.o. male  who was seen today for physical therapy evaluation and treatment for R shoulder pain due to underlying supraspinatus partial tear.  Recent CSI has been beneficial, decreasing pain from 7/10 to 4/10.  Patient able to raise RUE OH and behind back with pain.  PROM is WFL, RC appears intact but resisted tasks are painful.  Mild TTP at R [facility] joint, more so at biceps tendon and CA ligament.  Patient is a good rehab candidate to regain shoulder mobility and function.  OBJECTIVE IMPAIRMENTS: decreased activity tolerance, decreased knowledge of condition,  decreased ROM, decreased strength, impaired perceived functional ability, impaired UE functional use, postural dysfunction, and pain.   ACTIVITY LIMITATIONS: carrying, lifting, sleeping, and dressing  PERSONAL FACTORS: Age and Past/current experiences are also affecting patient's functional outcome.   REHAB POTENTIAL: Good  CLINICAL DECISION MAKING: Stable/uncomplicated  EVALUATION COMPLEXITY: Moderate   GOALS: Goals reviewed with patient? No  SHORT TERM GOALS: Target date: 05/18/2024   Patient to demonstrate independence in HEP  Baseline: [email or web address] Goal status: INITIAL  2.  AROM R shoulder 160d flexion and abd Baseline:  A/PROM Right eval Left eval  Shoulder flexion 135/150d   Shoulder extension WFL   Shoulder abduction 135/150d    Goal status: INITIAL    LONG TERM GOALS: Target date: 06/22/2024  Patient will acknowledge <4/10 pain at least once during episode of care  Baseline: 4/10 Goal status: INITIAL  2.  Increase R shoulder strength to 4/5 Baseline:  MMT Right eval Left eval  Shoulder flexion 4-   Shoulder extension    Shoulder abduction 4-    Goal status: INITIAL  3.  Patient will score at least 14/55 on qDASH to signify clinically meaningful improvement in functional abilities.   Baseline: 22/55 Goal status: INITIAL    PLAN:  PT FREQUENCY: 1-2x/week  PT DURATION: 6 weeks  PLANNED INTERVENTIONS: 97110-Therapeutic exercises, 97530- Therapeutic activity, 97112- Neuromuscular re-education, 97535- Self Care, [id number]- Manual therapy, and Patient/Family education  PLAN FOR NEXT SESSION: HEP review and update, manual techniques as appropriate, aerobic tasks, ROM and flexibility activities, strengthening and PREs, TPDN, gait and balance training,aquatic therapy, modalities for pain and NMRE    Date of referral: 03/16/2024 Referring provider: Lynwood Barter, DO Referring diagnosis? M75.101 (ICD-10-CM) - Tear of right supraspinatus tendon M75.21  (ICD-10-CM) - Tendonitis, bicipital, right M19.011 (ICD-10-CM) - Localized osteoarthritis of right shoulder Treatment diagnosis? (if different than referring diagnosis) Partial-thickness tearing of right supraspinatus and right bicipital tendinitis  What was this (referring dx) caused by? Surgery (Type: lifting)  Nature of Condition: Initial Onset (within last 3 months)   Laterality: Rt  Current Functional Measure Score: DASH 22/55  Objective measurements identify impairments when they are compared to normal values, the uninvolved extremity, and prior level of function.  [  x] Yes  []  No  Objective assessment of functional ability: Moderate functional limitations   Briefly describe symptoms: pain with OH and behind the back motions  How did symptoms start: lifting  Average pain intensity:  Last 24 hours: 4  Past week: 4  How often does the pt experience symptoms? Frequently  How much have the symptoms interfered with usual daily activities? Moderately  How has condition changed since care began at this facility? NA - initial visit  In general, how is the patients overall health? Good   BACK PAIN (STarT Back Screening Tool) No   Reyes CHRISTELLA Kohut, PT 05/22/2024, 11:27 AM

## 2024-05-25 ENCOUNTER — Ambulatory Visit

## 2024-05-25 DIAGNOSIS — M25511 Pain in right shoulder: Secondary | ICD-10-CM | POA: Diagnosis not present

## 2024-05-25 DIAGNOSIS — R293 Abnormal posture: Secondary | ICD-10-CM

## 2024-05-25 DIAGNOSIS — M7591 Shoulder lesion, unspecified, right shoulder: Secondary | ICD-10-CM

## 2024-05-29 ENCOUNTER — Ambulatory Visit

## 2024-05-31 ENCOUNTER — Ambulatory Visit

## 2024-06-13 ENCOUNTER — Ambulatory Visit (HOSPITAL_BASED_OUTPATIENT_CLINIC_OR_DEPARTMENT_OTHER): Admitting: Family

## 2024-06-13 ENCOUNTER — Encounter (HOSPITAL_BASED_OUTPATIENT_CLINIC_OR_DEPARTMENT_OTHER): Payer: Self-pay | Admitting: Family

## 2024-06-13 ENCOUNTER — Other Ambulatory Visit (HOSPITAL_COMMUNITY): Payer: Self-pay

## 2024-06-13 VITALS — BP 152/72 | HR 95 | Ht 70.0 in | Wt 166.3 lb

## 2024-06-13 DIAGNOSIS — I428 Other cardiomyopathies: Secondary | ICD-10-CM | POA: Diagnosis not present

## 2024-06-13 DIAGNOSIS — Z8673 Personal history of transient ischemic attack (TIA), and cerebral infarction without residual deficits: Secondary | ICD-10-CM | POA: Diagnosis not present

## 2024-06-13 DIAGNOSIS — E785 Hyperlipidemia, unspecified: Secondary | ICD-10-CM | POA: Diagnosis not present

## 2024-06-13 DIAGNOSIS — R0609 Other forms of dyspnea: Secondary | ICD-10-CM

## 2024-06-13 MED ORDER — FUROSEMIDE 20 MG PO TABS
20.0000 mg | ORAL_TABLET | Freq: Every day | ORAL | 2 refills | Status: AC
  Filled 2024-06-13: qty 30, 30d supply, fill #0

## 2024-06-13 NOTE — Progress Notes (Unsigned)
 " Cardiology Office Note   Date:  06/14/2024  ID:  [name], DOB 09-14-1950, MRN [id number] PCP: Stoney Blizzard, DO  [facility] Providers Cardiologist:  Shelda Bruckner, MD     History of Present Illness [name] is a 74 y.o. male with hx of HTN, unclear cardiomyopathy, CVA, CKD.  2022 LVEF 60-65%, 08/2023 LVEF 45-50%. Remote stroke at [facility] >20 years ago.  Established with Dr. Bruckner 10/15/2023.  Recent hospitalization for syncope. Diagnosis of hypertension 15 years prior which had been difficult to control. He was only taking Carvedilol  in the morning and Lisinopril  at night. No BMET recheck since resuming Lisinopril . Farxiga  10mg  daily initiated, Carvedilol  adjusted to BID. Plan to optimize medical therapy, repeat echo in 3 months prior to consideration of ischemic evaluation.   Echocardiogram 02/04/2024 LVEF 50-55%, very mild hypokinesis of inferior and inferoseptal myocardium, mild LVH, grade 1 diastolic dysfunction, elevated LVEDP consistent with RV overload, RV normal size and function, mildly elevated PASP, mild LAE, aortic valve sclerosis without stenosis.  Presents today for follow up independently. Reports episdoes of tightness with ambulating even around his home ongoing for about a month. It does improve with 10 minutes of rest. This is happening every day. This is associated dyspnea. No orthopnea, PND. Occasional unrelated palpitations. BP at home has been routinely 130s. Reports occasional lightheadedness if he gets up out of the bed. Reviewed orthostatic precautions.   ROS: Please see the history of present illness.    All other systems reviewed and are negative.   Studies Reviewed      Cardiac Studies & Procedures   ______________________________________________________________________________________________     ECHOCARDIOGRAM  ECHOCARDIOGRAM COMPLETE 02/04/2024  Narrative ECHOCARDIOGRAM REPORT    Patient Name:   [name] Date of Exam: 02/04/2024 Medical Rec #:  [id number]       Height:       70.0 in Accession #:    [id number]      Weight:       164.4 lb Date of Birth:  [birth year]       BSA:          1.920 m Patient Age:    73 years        BP:           156/68 mmHg Patient Gender: M               HR:           85 bpm. Exam Location:  Outpatient  Procedure: 2D Echo, 3D Echo, Cardiac Doppler, Color Doppler and Strain Analysis (Both Spectral and Color Flow Doppler were utilized during procedure).  Indications:    R55 Syncope  History:        Patient has prior history of Echocardiogram examinations, most recent 09/06/2023. Stroke, Signs/Symptoms:Syncope; Risk Factors:Hypertension, CKD stage 3b, Dyslipidemia and Former Smoker. Patient denies chest pain, SOB and leg edema. He had an episode of syncope.  Sonographer:    Annabella Cater RVT, RDCS (AE), RDMS Referring Phys: [phone] Marvel Sapp S Raymonda Pell  IMPRESSIONS   1. Very mild hypokinesis of the inferior and inferoseptal myocardium. Left ventricular ejection fraction, by estimation, is 50 to 55%. Left ventricular ejection fraction by 2D MOD biplane is 53.2 %. The left ventricle has low normal function. The left ventricle demonstrates regional wall motion abnormalities (see scoring diagram/findings for description). There is mild concentric left ventricular hypertrophy. Left ventricular diastolic parameters are consistent with Grade I diastolic dysfunction (impaired relaxation). Elevated left  ventricular end-diastolic pressure. There is the interventricular septum is flattened in systole, consistent with right ventricular pressure overload. The average left ventricular global longitudinal strain is -12.5 %. The global longitudinal strain is abnormal. 2. Right ventricular systolic function is normal. The right ventricular size is normal. There is mildly elevated pulmonary artery systolic pressure. 3. Left atrial size was mildly dilated. 4. The mitral valve is  normal in structure. Trivial mitral valve regurgitation. No evidence of mitral stenosis. 5. The aortic valve is tricuspid. There is mild calcification of the aortic valve. There is mild thickening of the aortic valve. Aortic valve regurgitation is trivial. Aortic valve sclerosis/calcification is present, without any evidence of aortic stenosis. Aortic valve area, by VTI measures 1.85 cm. Aortic valve mean gradient measures 3.0 mmHg. Aortic valve Vmax measures 1.13 m/s. 6. The inferior vena cava is normal in size with greater than 50% respiratory variability, suggesting right atrial pressure of 3 mmHg.  Comparison(s): EF 45%, GLS -11.5 %.  FINDINGS Left Ventricle: Very mild hypokinesis of the inferior and inferoseptal myocardium. Left ventricular ejection fraction, by estimation, is 50 to 55%. Left ventricular ejection fraction by 2D MOD biplane is 53.2 %. The left ventricle has low normal function. The left ventricle demonstrates regional wall motion abnormalities. The average left ventricular global longitudinal strain is -12.5 %. Strain was performed and the global longitudinal strain is abnormal. 3D ejection fraction reviewed and evaluated as part of the interpretation. Alternate measurement of EF is felt to be most reflective of LV function. The left ventricular internal cavity size was normal in size. There is mild concentric left ventricular hypertrophy. The interventricular septum is flattened in systole, consistent with right ventricular pressure overload. Left ventricular diastolic parameters are consistent with Grade I diastolic dysfunction (impaired relaxation). Elevated left ventricular end-diastolic pressure.  Right Ventricle: The right ventricular size is normal. No increase in right ventricular wall thickness. Right ventricular systolic function is normal. There is mildly elevated pulmonary artery systolic pressure. The tricuspid regurgitant velocity is 2.88 m/s, and with an assumed  right atrial pressure of 3 mmHg, the estimated right ventricular systolic pressure is 36.2 mmHg.  Left Atrium: Left atrial size was mildly dilated.  Right Atrium: Right atrial size was normal in size.  Pericardium: There is no evidence of pericardial effusion.  Mitral Valve: The mitral valve is normal in structure. Trivial mitral valve regurgitation. No evidence of mitral valve stenosis.  Tricuspid Valve: The tricuspid valve is normal in structure. Tricuspid valve regurgitation is trivial. No evidence of tricuspid stenosis.  Aortic Valve: The aortic valve is tricuspid. There is mild calcification of the aortic valve. There is mild thickening of the aortic valve. Aortic valve regurgitation is trivial. Aortic regurgitation PHT measures 670 msec. Aortic valve sclerosis/calcification is present, without any evidence of aortic stenosis. Aortic valve mean gradient measures 3.0 mmHg. Aortic valve peak gradient measures 5.1 mmHg. Aortic valve area, by VTI measures 1.85 cm.  Pulmonic Valve: The pulmonic valve was normal in structure. Pulmonic valve regurgitation is trivial. No evidence of pulmonic stenosis.  Aorta: The aortic root is normal in size and structure.  Venous: The inferior vena cava is normal in size with greater than 50% respiratory variability, suggesting right atrial pressure of 3 mmHg.  IAS/Shunts: No atrial level shunt detected by color flow Doppler.   LEFT VENTRICLE PLAX 2D                        Biplane EF (MOD) LVIDd:  3.91 cm         LV Biplane EF:   Left LVIDs:         2.88 cm                          ventricular LV PW:         1.09 cm                          ejection LV IVS:        1.16 cm                          fraction by LVOT diam:     1.90 cm                          2D MOD LV SV:         39                               biplane is LV SV Index:   20                               53.2 %. LVOT Area:     2.84 cm Diastology LV e' medial:    4.45 cm/s LV  Volumes (MOD)               LV E/e' medial:  18.0 LV vol d, MOD    65.7 ml       LV e' lateral:   5.06 cm/s A2C:                           LV E/e' lateral: 15.8 LV vol d, MOD    68.1 ml A4C:                           2D Longitudinal LV vol s, MOD    28.6 ml       Strain A2C:                           2D Strain GLS   -14.5 % LV vol s, MOD    34.3 ml       (A4C): A4C:                           2D Strain GLS   -12.5 % LV SV MOD A2C:   37.1 ml       (A3C): LV SV MOD A4C:   68.1 ml       2D Strain GLS   -10.6 % LV SV MOD BP:    35.5 ml       (A2C): 2D Strain GLS   -12.5 % Avg:  3D Volume EF: 3D EF:        47 % LV EDV:       94 ml LV ESV:       50 ml LV SV:        44 ml  RIGHT VENTRICLE RV S prime:     11.20 cm/s TAPSE (M-mode): 2.1 cm  LEFT  ATRIUM             Index        RIGHT ATRIUM           Index LA diam:        3.70 cm 1.93 cm/m   RA Area:     10.60 cm LA Vol (A2C):   60.5 ml 31.50 ml/m  RA Volume:   27.40 ml  14.27 ml/m LA Vol (A4C):   53.8 ml 28.01 ml/m LA Biplane Vol: 58.2 ml 30.30 ml/m AORTIC VALVE                    PULMONIC VALVE AV Area (Vmax):    1.90 cm     PV Vmax:          1.10 m/s AV Area (Vmean):   1.83 cm     PV Peak grad:     4.9 mmHg AV Area (VTI):     1.85 cm     PR End Diast Vel: 9.30 msec AV Vmax:           113.00 cm/s AV Vmean:          76.800 cm/s AV VTI:            0.211 m AV Peak Grad:      5.1 mmHg AV Mean Grad:      3.0 mmHg LVOT Vmax:         75.60 cm/s LVOT Vmean:        49.500 cm/s LVOT VTI:          0.138 m LVOT/AV VTI ratio: 0.65 AI PHT:            670 msec AR Vena Contracta: 0.31 cm  AORTA Ao Root diam: 3.50 cm Ao Asc diam:  3.40 cm Ao Arch diam: 3.0 cm  MITRAL VALVE                TRICUSPID VALVE MV Area (PHT): 2.83 cm     TR Peak grad:   33.2 mmHg MV Decel Time: 268 msec     TR Vmax:        288.00 cm/s MV E velocity: 80.20 cm/s MV A velocity: 111.00 cm/s  SHUNTS MV E/A ratio:  0.72         Systemic VTI:  0.14  m Systemic Diam: 1.90 cm  Annabella Scarce MD Electronically signed by Annabella Scarce MD Signature Date/Time: 02/04/2024/11:15:11 AM    Final          ______________________________________________________________________________________________        Risk Assessment/Calculations          Physical Exam VS:  BP (!) 152/72 (BP Location: Left Arm, Patient Position: Sitting, Cuff Size: Normal)   Pulse 95   Ht 5' 10 (1.778 m)   Wt 166 lb 4.8 oz (75.4 kg)   SpO2 94%   BMI 23.86 kg/m        Wt Readings from Last 3 Encounters:  06/13/24 166 lb 4.8 oz (75.4 kg)  05/09/24 163 lb 12.8 oz (74.3 kg)  04/26/24 163 lb (73.9 kg)    GEN: Well nourished, well developed in no acute distress NECK: No JVD; No carotid bruits CARDIAC: RRR, no murmurs, rubs, gallops RESPIRATORY:  Clear to auscultation without rales, wheezing or rhonchi  ABDOMEN: Soft, non-tender, non-distended EXTREMITIES:  No edema; No deformity   ASSESSMENT AND PLAN   HFmrEF / CKDIIIb / LE edema / DOE -echo 01/2024 LVEF  improved to 50 to 55% however elevated LVEDP and elevated right sided volume overload.  Reports dyspnea.   Rx Lasix  20 mg daily. Labs today BNP, BMP, CBC to assess volume status, renal function, rule out anemia. Repeat BNP, BMP in 7 to 10 days GDMT includes carvedilol  12.5 mg twice daily, Farxiga  10 mg daily, lisinopril  20 mg daily, addition of Lasix  20 mg daily today. If dyspnea does not improve consider cardiac PET given minor wall motion abnormalities on echo 01/2024. Low sodium diet, fluid restriction <2L, and daily weights encouraged. Educated to contact our office for weight gain of 2 lbs overnight or 5 lbs in one week.   HTN -BP elevated in clinic.  Reports well-controlled in the 130s at home.  Previous propensity for lightheadedness, hypotension.  Continue carvedilol  12.5 mg twice daily, lisinopril  20 mg daily.  Low threshold to change ACE to ARB at follow-up.  HLD, LDL goal less than  70/history of CVA-continue atorvastatin  80 mg daily.  Update lipid panel today.  CKD / Anemia (likely of chronic disease) - Update BMET, CBC.       Dispo: follow up in 1 month  Signed, Reche GORMAN Finder, NP   "

## 2024-06-13 NOTE — Patient Instructions (Addendum)
 Medication Instructions:  START Furosemide  (Lasix ) one 20mg  tablet daily  *If you need a refill on your cardiac medications before your next appointment, please call your pharmacy*  Lab Work: Your physician recommends that you return for lab work today: BNP, BMP, CBC, lipid panel  Your physician recommends that you return for lab work in 7-10 days: BNP, BMP  If you have labs (blood work) drawn today and your tests are completely normal, you will receive your results only by: MyChart Message (if you have MyChart) OR A paper copy in the mail If you have any lab test that is abnormal or we need to change your treatment, we will call you to review the results.   Follow-Up: At [facility], you and your health needs are our priority.  As part of our continuing mission to provide you with exceptional heart care, our providers are all part of one team.  This team includes your primary Cardiologist (physician) and Advanced Practice Providers or APPs (Physician Assistants and Nurse Practitioners) who all work together to provide you with the care you need, when you need it.  Your next appointment:   1 month(s)  Provider:   Shelda Bruckner, MD, Rosaline Bane, NP, or Reche Finder, NP     Other Instructions

## 2024-06-14 ENCOUNTER — Encounter (HOSPITAL_BASED_OUTPATIENT_CLINIC_OR_DEPARTMENT_OTHER): Payer: Self-pay | Admitting: Family

## 2024-06-14 LAB — BRAIN NATRIURETIC PEPTIDE: BNP: 3.6 pg/mL (ref 0.0–100.0)

## 2024-06-14 LAB — BASIC METABOLIC PANEL WITH GFR
BUN/Creatinine Ratio: 16 (ref 10–24)
BUN: 35 mg/dL — ABNORMAL HIGH (ref 8–27)
CO2: 21 mmol/L (ref 20–29)
Calcium: 9.3 mg/dL (ref 8.6–10.2)
Chloride: 105 mmol/L (ref 96–106)
Creatinine, Ser: 2.2 mg/dL — ABNORMAL HIGH (ref 0.76–1.27)
Glucose: 96 mg/dL (ref 70–99)
Potassium: 4.6 mmol/L (ref 3.5–5.2)
Sodium: 143 mmol/L (ref 134–144)
eGFR: 31 mL/min/1.73 — ABNORMAL LOW

## 2024-06-14 LAB — CBC
Hematocrit: 40.7 % (ref 37.5–51.0)
Hemoglobin: 12.5 g/dL — ABNORMAL LOW (ref 13.0–17.7)
MCH: 24.2 pg — ABNORMAL LOW (ref 26.6–33.0)
MCHC: 30.7 g/dL — ABNORMAL LOW (ref 31.5–35.7)
MCV: 79 fL (ref 79–97)
Platelets: 227 x10E3/uL (ref 150–450)
RBC: 5.16 x10E6/uL (ref 4.14–5.80)
RDW: 14.9 % (ref 11.6–15.4)
WBC: 6.3 x10E3/uL (ref 3.4–10.8)

## 2024-06-14 LAB — LIPID PANEL
Chol/HDL Ratio: 3.1 ratio (ref 0.0–5.0)
Cholesterol, Total: 119 mg/dL (ref 100–199)
HDL: 39 mg/dL — ABNORMAL LOW
LDL Chol Calc (NIH): 59 mg/dL (ref 0–99)
Triglycerides: 115 mg/dL (ref 0–149)
VLDL Cholesterol Cal: 21 mg/dL (ref 5–40)

## 2024-06-15 ENCOUNTER — Ambulatory Visit (HOSPITAL_BASED_OUTPATIENT_CLINIC_OR_DEPARTMENT_OTHER): Payer: Self-pay | Admitting: Family

## 2024-06-15 NOTE — Telephone Encounter (Signed)
 The patient has been notified of the result and verbalized understanding.  All questions (if any) were answered.  Pt aware to proceed with lasix  as discussed at last OV and come in for repeat BMET/BNP in 7 days for monitoring, and as last advised in the office visit.  Pt verbalized understanding and agrees with this plan.

## 2024-06-15 NOTE — Telephone Encounter (Signed)
-----   Message from Reche Finder, NP sent at 06/15/2024 10:38 AM EST ----- Stable mild anemia. BNP with no fluid overload. Cholesterol numbers at goal. Kidney function slightly decreased from prior, recommend proceed with Lasix  as discussed in clinic and repeat BMP in 7  days as already ordered.

## 2024-06-19 ENCOUNTER — Encounter: Payer: Self-pay | Admitting: Pharmacist

## 2024-06-19 NOTE — Progress Notes (Signed)
 This patient is appearing on a report for being at risk of failing the adherence measure for cholesterol (statin) medications this calendar year.   Medication: Atorvastatin   Last fill date: 05/30/24 for 90 day supply  Reviewed medication indication, dosing, and goals of therapy.

## 2024-06-24 LAB — BASIC METABOLIC PANEL WITH GFR
BUN/Creatinine Ratio: 13 (ref 10–24)
BUN: 29 mg/dL — ABNORMAL HIGH (ref 8–27)
CO2: 19 mmol/L — ABNORMAL LOW (ref 20–29)
Calcium: 9.4 mg/dL (ref 8.6–10.2)
Chloride: 103 mmol/L (ref 96–106)
Creatinine, Ser: 2.24 mg/dL — ABNORMAL HIGH (ref 0.76–1.27)
Glucose: 108 mg/dL — ABNORMAL HIGH (ref 70–99)
Potassium: 4.7 mmol/L (ref 3.5–5.2)
Sodium: 136 mmol/L (ref 134–144)
eGFR: 30 mL/min/1.73 — ABNORMAL LOW

## 2024-06-24 LAB — BRAIN NATRIURETIC PEPTIDE: BNP: 9.6 pg/mL (ref 0.0–100.0)

## 2024-07-06 ENCOUNTER — Ambulatory Visit

## 2024-07-06 VITALS — BP 138/88 | Ht 70.0 in | Wt 167.0 lb

## 2024-07-06 DIAGNOSIS — M25511 Pain in right shoulder: Secondary | ICD-10-CM

## 2024-07-06 DIAGNOSIS — M7521 Bicipital tendinitis, right shoulder: Secondary | ICD-10-CM | POA: Diagnosis not present

## 2024-07-06 DIAGNOSIS — M7551 Bursitis of right shoulder: Secondary | ICD-10-CM

## 2024-07-06 DIAGNOSIS — M75101 Unspecified rotator cuff tear or rupture of right shoulder, not specified as traumatic: Secondary | ICD-10-CM

## 2024-07-06 DIAGNOSIS — M19011 Primary osteoarthritis, right shoulder: Secondary | ICD-10-CM | POA: Diagnosis not present

## 2024-07-10 ENCOUNTER — Ambulatory Visit (HOSPITAL_BASED_OUTPATIENT_CLINIC_OR_DEPARTMENT_OTHER): Admitting: Cardiology

## 2024-07-24 ENCOUNTER — Ambulatory Visit: Admitting: Orthopedic Surgery

## 2024-10-04 ENCOUNTER — Ambulatory Visit (HOSPITAL_BASED_OUTPATIENT_CLINIC_OR_DEPARTMENT_OTHER): Admitting: Cardiology
